# Patient Record
Sex: Female | Born: 1935 | Race: White | Hispanic: No | Smoking: Never smoker
Health system: Southern US, Community
[De-identification: ages and names within clinical notes are randomized; demographics above are authoritative.]

## PROBLEM LIST (undated history)

## (undated) DIAGNOSIS — M1990 Unspecified osteoarthritis, unspecified site: Secondary | ICD-10-CM

## (undated) DIAGNOSIS — K5720 Diverticulitis of large intestine with perforation and abscess without bleeding: Principal | ICD-10-CM

## (undated) DIAGNOSIS — I219 Acute myocardial infarction, unspecified: Secondary | ICD-10-CM

## (undated) DIAGNOSIS — I4891 Unspecified atrial fibrillation: Secondary | ICD-10-CM

## (undated) DIAGNOSIS — D649 Anemia, unspecified: Secondary | ICD-10-CM

## (undated) DIAGNOSIS — Z9289 Personal history of other medical treatment: Secondary | ICD-10-CM

## (undated) DIAGNOSIS — N189 Chronic kidney disease, unspecified: Secondary | ICD-10-CM

## (undated) DIAGNOSIS — E039 Hypothyroidism, unspecified: Secondary | ICD-10-CM

## (undated) DIAGNOSIS — K279 Peptic ulcer, site unspecified, unspecified as acute or chronic, without hemorrhage or perforation: Secondary | ICD-10-CM

## (undated) DIAGNOSIS — W19XXXA Unspecified fall, initial encounter: Secondary | ICD-10-CM

## (undated) DIAGNOSIS — I255 Ischemic cardiomyopathy: Secondary | ICD-10-CM

## (undated) DIAGNOSIS — J301 Allergic rhinitis due to pollen: Secondary | ICD-10-CM

## (undated) DIAGNOSIS — I10 Essential (primary) hypertension: Secondary | ICD-10-CM

## (undated) DIAGNOSIS — K219 Gastro-esophageal reflux disease without esophagitis: Secondary | ICD-10-CM

## (undated) DIAGNOSIS — L97519 Non-pressure chronic ulcer of other part of right foot with unspecified severity: Secondary | ICD-10-CM

## (undated) DIAGNOSIS — I6529 Occlusion and stenosis of unspecified carotid artery: Secondary | ICD-10-CM

## (undated) DIAGNOSIS — T8859XA Other complications of anesthesia, initial encounter: Secondary | ICD-10-CM

## (undated) DIAGNOSIS — T4145XA Adverse effect of unspecified anesthetic, initial encounter: Secondary | ICD-10-CM

## (undated) DIAGNOSIS — R079 Chest pain, unspecified: Secondary | ICD-10-CM

## (undated) DIAGNOSIS — Z8701 Personal history of pneumonia (recurrent): Secondary | ICD-10-CM

## (undated) DIAGNOSIS — E785 Hyperlipidemia, unspecified: Secondary | ICD-10-CM

## (undated) DIAGNOSIS — Y92009 Unspecified place in unspecified non-institutional (private) residence as the place of occurrence of the external cause: Secondary | ICD-10-CM

## (undated) DIAGNOSIS — I739 Peripheral vascular disease, unspecified: Secondary | ICD-10-CM

## (undated) DIAGNOSIS — I2510 Atherosclerotic heart disease of native coronary artery without angina pectoris: Secondary | ICD-10-CM

## (undated) HISTORY — DX: Unspecified osteoarthritis, unspecified site: M19.90

## (undated) HISTORY — DX: Unspecified atrial fibrillation: I48.91

## (undated) HISTORY — DX: Anemia, unspecified: D64.9

## (undated) HISTORY — DX: Peripheral vascular disease, unspecified: I73.9

## (undated) HISTORY — DX: Hypothyroidism, unspecified: E03.9

## (undated) HISTORY — PX: CATARACT EXTRACTION W/ INTRAOCULAR LENS  IMPLANT, BILATERAL: SHX1307

## (undated) HISTORY — DX: Acute myocardial infarction, unspecified: I21.9

## (undated) HISTORY — DX: Essential (primary) hypertension: I10

## (undated) HISTORY — DX: Unspecified fall, initial encounter: W19.XXXA

## (undated) HISTORY — DX: Atherosclerotic heart disease of native coronary artery without angina pectoris: I25.10

## (undated) HISTORY — DX: Diverticulitis of large intestine with perforation and abscess without bleeding: K57.20

## (undated) HISTORY — DX: Occlusion and stenosis of unspecified carotid artery: I65.29

## (undated) HISTORY — DX: Unspecified place in unspecified non-institutional (private) residence as the place of occurrence of the external cause: Y92.009

## (undated) HISTORY — DX: Allergic rhinitis due to pollen: J30.1

## (undated) HISTORY — DX: Hyperlipidemia, unspecified: E78.5

## (undated) HISTORY — PX: CARDIAC CATHETERIZATION: SHX172

---
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
## 1974-01-26 HISTORY — PX: ABDOMINAL ADHESION SURGERY: SHX90

## 1974-01-26 HISTORY — PX: ABDOMINAL HYSTERECTOMY: SHX81

## 1974-01-26 HISTORY — PX: COMBINED HYSTERECTOMY ABDOMINAL W/ A&P REPAIR / OOPHORECTOMY: SUR292

## 2004-01-27 HISTORY — PX: JOINT REPLACEMENT: SHX530

## 2004-07-26 HISTORY — PX: TOTAL HIP ARTHROPLASTY: SHX124

## 2009-10-26 HISTORY — PX: COLECTOMY: SHX59

## 2010-10-27 HISTORY — PX: COLOSTOMY: SHX63

## 2010-11-08 ENCOUNTER — Encounter: Payer: Self-pay | Admitting: Physician Assistant

## 2010-11-09 ENCOUNTER — Encounter: Payer: Self-pay | Admitting: Physician Assistant

## 2010-11-10 ENCOUNTER — Encounter: Payer: Self-pay | Admitting: Physician Assistant

## 2010-11-11 ENCOUNTER — Encounter: Payer: Self-pay | Admitting: Physician Assistant

## 2010-11-12 ENCOUNTER — Encounter: Payer: Self-pay | Admitting: Physician Assistant

## 2010-11-14 ENCOUNTER — Encounter: Payer: Self-pay | Admitting: Physician Assistant

## 2010-11-16 ENCOUNTER — Encounter: Payer: Self-pay | Admitting: Physician Assistant

## 2011-01-21 ENCOUNTER — Other Ambulatory Visit: Payer: Self-pay | Admitting: Internal Medicine

## 2011-01-21 DIAGNOSIS — Z780 Asymptomatic menopausal state: Secondary | ICD-10-CM

## 2011-01-21 DIAGNOSIS — Z1231 Encounter for screening mammogram for malignant neoplasm of breast: Secondary | ICD-10-CM

## 2011-01-23 ENCOUNTER — Institutional Professional Consult (permissible substitution): Payer: Self-pay | Admitting: Cardiology

## 2011-02-09 ENCOUNTER — Ambulatory Visit
Admission: RE | Admit: 2011-02-09 | Discharge: 2011-02-09 | Disposition: A | Discharge status: Home / Self Care | Payer: Medicare Other | Source: Ambulatory Visit | Attending: Internal Medicine | Admitting: Internal Medicine

## 2011-02-09 ENCOUNTER — Ambulatory Visit (INDEPENDENT_AMBULATORY_CARE_PROVIDER_SITE_OTHER): Payer: Medicare Other | Admitting: Surgery

## 2011-02-09 DIAGNOSIS — Z1231 Encounter for screening mammogram for malignant neoplasm of breast: Secondary | ICD-10-CM

## 2011-02-09 DIAGNOSIS — Z780 Asymptomatic menopausal state: Secondary | ICD-10-CM

## 2011-02-11 ENCOUNTER — Ambulatory Visit (INDEPENDENT_AMBULATORY_CARE_PROVIDER_SITE_OTHER): Payer: Medicare Other | Admitting: Surgery

## 2011-02-19 ENCOUNTER — Ambulatory Visit (INDEPENDENT_AMBULATORY_CARE_PROVIDER_SITE_OTHER): Payer: Medicare Other | Admitting: Cardiology

## 2011-02-19 ENCOUNTER — Encounter: Payer: Self-pay | Admitting: Cardiology

## 2011-02-19 VITALS — BP 140/110 | HR 56 | Ht 62.0 in | Wt 129.0 lb

## 2011-02-19 DIAGNOSIS — I219 Acute myocardial infarction, unspecified: Secondary | ICD-10-CM

## 2011-02-19 DIAGNOSIS — R0989 Other specified symptoms and signs involving the circulatory and respiratory systems: Secondary | ICD-10-CM | POA: Insufficient documentation

## 2011-02-19 DIAGNOSIS — I10 Essential (primary) hypertension: Secondary | ICD-10-CM | POA: Insufficient documentation

## 2011-02-19 DIAGNOSIS — I4891 Unspecified atrial fibrillation: Secondary | ICD-10-CM | POA: Insufficient documentation

## 2011-02-19 DIAGNOSIS — R011 Cardiac murmur, unspecified: Secondary | ICD-10-CM | POA: Insufficient documentation

## 2011-02-19 DIAGNOSIS — I2510 Atherosclerotic heart disease of native coronary artery without angina pectoris: Secondary | ICD-10-CM

## 2011-02-19 NOTE — Patient Instructions (Signed)
Your physician has requested that you have an echocardiogram. Echocardiography is a painless test that uses sound waves to create images of your heart. It provides your doctor with information about the size and shape of your heart and how well your heart's chambers and valves are working. This procedure takes approximately one hour. There are no restrictions for this procedure.  Continue current medications as listed.  Follow up in approximately in 2 weeks with Dr Antoine Poche

## 2011-02-19 NOTE — Assessment & Plan Note (Signed)
Her blood pressure is elevated. I would like her to keep a blood pressure diary. It is likely that she will need med titration. However, she reports sensitivities to multiple medications.

## 2011-02-19 NOTE — Assessment & Plan Note (Signed)
When she comes back I will schedule carotid Dopplers.

## 2011-02-19 NOTE — Assessment & Plan Note (Signed)
I am confused about her past history of coronary disease. I would like to get the records from [place]. At this point I will start with an echocardiogram to evaluate ejection fraction and the murmur described. It is very likely that she will need stress perfusion imaging prior to any colostomy reversal and I discussed this with her and her daughter.

## 2011-02-19 NOTE — Assessment & Plan Note (Signed)
She could likely discontinue the amiodarone. However, if she is to have surgery very so and it would be reasonable to continue this through surgery to try to prevent any atrial fibrillation induced by this.   

## 2011-02-19 NOTE — Progress Notes (Signed)
HPI The patient presents as a new patient. She recently moved from [place]. From outside records I see that she was admitted in [facility] in [facility] last October with a perforated diverticulum with peritoneal abscess.    She also had atrial fibrillation.  There was apparently no work up of her MI at the time of this hospitalization.  She is currently living with 1 of her sons. She may move back to [place]. She might actually have reversal of a colostomy before she moves back however. She is due to meet with a surgeon soon.  Aside from the recent history that sounds like a non-Q-wave MI during an episode of sepsis and atrial fibrillation she has an otherwise vague cardiac history. She reports a heart attack in the 1950s. She reports being told in 1989 that she needed triple vessel bypass. However, she then does on to say that she didn't have catheterization at that time. She was treated with years of chelation.  She denies any active chest pressure, neck or arm discomfort. She does not have any palpitations, presyncope or syncope. She has no PND or orthopnea. She has been weak since her extended hospitalization with prolonged rehabilitation.  Allergies  Allergen Reactions  . Demerol Other (See Comments)    coma  . Cephalosporins   . Contrast Media (Iodinated Diagnostic Agents)   . Insulins   . Keflex   . Latex   . Morphine And Related   . Penicillins   . Sulfa Antibiotics   . Aspirin     coma    Current Outpatient Prescriptions  Medication Sig Dispense Refill  . amiodarone (PACERONE) 200 MG tablet Take 200 mg by mouth daily.      . cetirizine (ZYRTEC) 10 MG tablet Take 10 mg by mouth daily.      Marland Kitchen dextromethorphan (DELSYM) 30 MG/5ML liquid Take 60 mg by mouth as needed.      . metoprolol tartrate (LOPRESSOR) 25 MG tablet Take 25 mg by mouth 2 (two) times daily. 1/ 2 tab in am 1/2 in pm      . NON FORMULARY Take 25 mg by mouth. apap codeine 30 mg 1-2 q 4 hrs as needed         Past Medical History  Diagnosis Date  . Coronary artery disease   . Arrhythmia     afib  . Diverticulitis of colon (without mention of hemorrhage)   . Injury to liver   . Allergic rhinitis due to pollen   . Hyperlipidemia   . Hypertension   . Bone/cartilage disorder   . Vitamin D deficiency   . Anemia, unspecified     Past Surgical History  Procedure Date  . Appendectomy   . Vaginal hysterectomy   . Total hip arthroplasty     Right  . Colostomy     Family History  Problem Relation Age of Onset  . Heart disease Mother 9    Pacemaker, No CAD  . Lung disease Father 20    Smoking    History   Social History  . Marital Status: Widowed    Spouse Name: N/A    Number of Children: N/A  . Years of Education: N/A   Occupational History  . Not on file.   Social History Main Topics  . Smoking status: Never Smoker   . Smokeless tobacco: Not on file  . Alcohol Use: Not on file  . Drug Use: Not on file  . Sexually Active:  Not on file   Other Topics Concern  . Not on file   Social History Narrative  . No narrative on file    ROS:  Decreased appetite, nausea. Otherwise as stated in the HPI and negative for all other systems.  PHYSICAL EXAM BP 140/110  Pulse 56  Ht 5\' 2"  (1.575 m)  Wt 129 lb (58.514 kg)  BMI 23.59 kg/m2 GENERAL:  Well appearing HEENT:  Pupils equal round and reactive, fundi not visualized, oral mucosa unremarkable NECK:  No jugular venous distention, waveform within normal limits, carotid upstroke brisk and symmetric, bilateral bruits, no thyromegaly LYMPHATICS:  No cervical, inguinal adenopathy LUNGS:  Clear to auscultation bilaterally BACK:  No CVA tenderness CHEST:  Unremarkable HEART:  PMI not displaced or sustained,S1 and S2 within normal limits, no S3, no S4, no clicks, no rubs, apical holosystolic murmur 3/6 with radiation to the axilla and mildly out the outflow tract. ABD:  Flat, positive bowel sounds normal in frequency in  pitch, no bruits, no rebound, no guarding, no midline pulsatile mass, no hepatomegaly, no splenomegaly, colostomy left lower quadrent EXT:  2 plus pulses throughout, no edema, no cyanosis no clubbing SKIN:  No rashes no nodules NEURO:  Cranial nerves II through XII grossly intact, motor grossly intact throughout PSYCH:  Cognitively intact, oriented to person place and time  EKG:  Sinus rhythm, rate 56, axis within normal limits, early transition in lead V2, no acute ST-T wave changes. 02/19/2011   ASSESSMENT AND PLAN

## 2011-02-19 NOTE — Assessment & Plan Note (Signed)
This will be evaluated with the echo as above. She likely has some mitral regurgitation.

## 2011-02-20 ENCOUNTER — Telehealth: Payer: Self-pay | Admitting: Cardiology

## 2011-02-20 NOTE — Telephone Encounter (Addendum)
ROI faxed to [facility] @ [phone] For Angelina Sheriff   02/20/11/KM  Records Received from [facility] gave to [facility] 02/20/11/KM

## 2011-02-23 ENCOUNTER — Ambulatory Visit (INDEPENDENT_AMBULATORY_CARE_PROVIDER_SITE_OTHER): Payer: Medicare Other | Admitting: Surgery

## 2011-02-23 ENCOUNTER — Encounter (INDEPENDENT_AMBULATORY_CARE_PROVIDER_SITE_OTHER): Payer: Self-pay | Admitting: Surgery

## 2011-02-23 DIAGNOSIS — K5720 Diverticulitis of large intestine with perforation and abscess without bleeding: Secondary | ICD-10-CM

## 2011-02-23 DIAGNOSIS — K5732 Diverticulitis of large intestine without perforation or abscess without bleeding: Secondary | ICD-10-CM

## 2011-02-23 HISTORY — DX: Diverticulitis of large intestine with perforation and abscess without bleeding: K57.20

## 2011-02-23 MED ORDER — METRONIDAZOLE IVPB CUSTOM: 500.0000 mg | INTRAVENOUS | Status: AC

## 2011-02-23 NOTE — Progress Notes (Signed)
Subjective:     Patient ID: Julia Greene, female   DOB: 11/07/1930, 78 y.o.   MRN: [id number]  HPI  Julia Greene  1931/01/22 [id number]  Patient Care Team: Oneal Grout, MD as PCP - General (Internal Medicine) Rollene Rotunda, MD as Consulting Physician (Cardiology) Rob Bunting, MD as Consulting Physician (Gastroenterology)  This patient is a 78 y.o.female who presents today for surgical evaluation.   Diagnosis: Perforated diverticulitis with acute abdomen  Procedure laparotomy and sigmoid colectomy 01/08/2011 by Dr. Leeroy Cha at  [facility], [place], [place]  Reason for visit: Consideration of colostomy takedown  Patient is an elderly woman that required emergency surgery. She had to be in the hospital for some time. She had atrial fibrillation and question of myocardial  infarction. She was in the intensive care unit. Gradually, she recovered. She was at a skilled facility for the next month. Due to financial issues, [place] her children. They come in today with her.  She no longer needs a cane. She's getting up and down well. She is establishing with  cardiology. She wishes to consider having the colostomy taken down. No bleeding.  She's never had a colonoscopy. No history of rectal bleeding no history of other colon surgeries  Patient Active Problem List  Diagnoses  . Murmur  . Bruit  . Atrial fibrillation  . Myocardial infarction  . HTN (hypertension)  . Diverticulitis of colon with perforation s/p colectomy/ostomy 13Oct2012    Past Medical History  Diagnosis Date  . Coronary artery disease   . Arrhythmia     afib  . Diverticulitis of colon (without mention of hemorrhage)   . Injury to liver   . Allergic rhinitis due to pollen   . Hyperlipidemia   . Hypertension   . Bone/cartilage disorder   . Vitamin D deficiency   . Anemia, unspecified   . Arthritis   . Diabetes mellitus     boarderline  .  A-fib   . Diverticulitis of colon with perforation s/p colectomy/ostomy 13Oct2012 02/23/2011    Past Surgical History  Procedure Date  . Appendectomy   . Vaginal hysterectomy   . Total hip arthroplasty 07/2004    Right  . Colostomy 10/2010  . Cesarean section 1955, 1959, 1960  . Abdominal hysterectomy 1976  . Bladder repair 1976    History   Social History  . Marital Status: Widowed    Spouse Name: N/A    Number of Children: N/A  . Years of Education: N/A   Occupational History  . Not on file.   Social History Main Topics  . Smoking status: Never Smoker   . Smokeless tobacco: Never Used  . Alcohol Use: No  . Drug Use: No  . Sexually Active: Not on file   Other Topics Concern  . Not on file   Social History Narrative  . No narrative on file    Family History  Problem Relation Age of Onset  . Heart disease Mother 87    Pacemaker, No CAD  . Lung disease Father 94    Smoking  . Cancer Daughter     ovarian    Current outpatient prescriptions:Melatonin 3 MG CAPS, Take by mouth as needed., Disp: , Rfl: ;  amiodarone (PACERONE) 200 MG tablet, Take 200 mg by mouth daily., Disp: , Rfl: ;  cetirizine (ZYRTEC) 10 MG tablet, Take 10 mg by mouth daily., Disp: , Rfl: ;  dextromethorphan (  DELSYM) 30 MG/5ML liquid, Take 60 mg by mouth as needed., Disp: , Rfl:  metoprolol tartrate (LOPRESSOR) 25 MG tablet, Take 25 mg by mouth 2 (two) times daily. 1/ 2 tab in am 1/2 in pm, Disp: , Rfl: ;  NON FORMULARY, Take 25 mg by mouth. apap codeine 30 mg 1-2 q 4 hrs as needed, Disp: , Rfl:  Current facility-administered medications:metroNIDAZOLE (FLAGYL) IVPB 500 mg, 500 mg, Intravenous, On Call to OR, Ardeth Sportsman, MD  Allergies  Allergen Reactions  . Demerol Other (See Comments)    coma  . Cephalosporins   . Contrast Media (Iodinated Diagnostic Agents)   . Insulins   . Keflex   . Latex   . Morphine And Related   . Other     Patient highly sensitive to antibiotics, narcotics and  any medication used to put patient to sleep for surgery.  . Penicillins   . Sulfa Antibiotics   . Aspirin     coma    BP 140/62  Pulse 70  Temp(Src) 97.9 F (36.6 C) (Temporal)  Resp 16  Ht 5' 2.5" (1.588 m)  Wt 127 lb 9.6 oz (57.879 kg)  BMI 22.97 kg/m2     Review of Systems  Constitutional: Negative for fever, chills, diaphoresis, appetite change and fatigue.  HENT: Negative for ear pain, sore throat, trouble swallowing, neck pain and ear discharge.   Eyes: Negative for photophobia, discharge and visual disturbance.  Respiratory: Negative for cough, choking, chest tightness and shortness of breath.   Cardiovascular: Negative for chest pain and palpitations.       Walks up/down steps OK.    Gastrointestinal: Negative for nausea, vomiting, abdominal pain, diarrhea, constipation, anal bleeding and rectal pain.       BM 1-2/day in bag  Genitourinary: Negative for dysuria, frequency and difficulty urinating.  Musculoskeletal: Negative for myalgias and gait problem.  Skin: Negative for color change, pallor and rash.  Neurological: Negative for dizziness, tremors, speech difficulty, weakness, light-headedness, numbness and headaches.  Hematological: Negative for adenopathy.  Psychiatric/Behavioral: Negative for confusion and agitation. The patient is not nervous/anxious.        Objective:   Physical Exam  Constitutional: She is oriented to person, place, and time. She appears well-developed and well-nourished. No distress.  HENT:  Head: Normocephalic.  Mouth/Throat: Oropharynx is clear and moist. No oropharyngeal exudate.  Eyes: Conjunctivae and EOM are normal. Pupils are equal, round, and reactive to light. No scleral icterus.  Neck: Normal range of motion. Neck supple. No tracheal deviation present.  Cardiovascular: Normal rate, regular rhythm and intact distal pulses.   Pulmonary/Chest: Effort normal and breath sounds normal. No respiratory distress. She exhibits no  tenderness.  Abdominal: Soft. She exhibits no distension and no mass. There is no tenderness. Hernia confirmed negative in the right inguinal area and confirmed negative in the left inguinal area.    Genitourinary: No vaginal discharge found.  Musculoskeletal: Normal range of motion. She exhibits no tenderness.  Lymphadenopathy:    She has no cervical adenopathy.       Right: No inguinal adenopathy present.       Left: No inguinal adenopathy present.  Neurological: She is alert and oriented to person, place, and time. No cranial nerve deficit. She exhibits normal muscle tone. Coordination normal.  Skin: Skin is warm and dry. No rash noted. She is not diaphoretic. No erythema.  Psychiatric: She has a normal mood and affect. Her behavior is normal. Judgment and thought content normal.  Her mood appears not anxious. Her speech is tangential. She does not exhibit a depressed mood.       Pleasant, smiling.  Talkative, reminiscent but directable       Assessment:     3 months s/p emergent colectomy/ostomy for perforated diverticulitis    Plan:     I think it is reasonable to consider colostomy takedown at some point. It is reasonable to start out laparoscopically. Her family favors that as well.  She needs a colonoscopy preoperatively to rule out any other pathology prior to takedown. She needs cardiology clearance. Dr. Antoine Poche is in the process of doing that. She is more than 3 months out. Once these two things are done, I would be willing to schedule surgery for her.  The anatomy & physiology of the digestive tract was discussed.  The pathophysiology was discussed.  Possibility of remaining with an ostomy permanently was discussed.  I offered ostomy takedown.  Laparoscopic & open techniques were discussed.   Risks such as bleeding, infection, abscess, leak, reoperation, possible re-ostomy, injury to other organs, hernia, heart attack, death, and other risks were discussed.   I noted a good  likelihood this will help address the problem.  Goals of post-operative recovery were discussed as well.  We will work to minimize complications.  Questions were answered.  The patient expresses understanding & wishes to proceed with surgery.

## 2011-02-24 ENCOUNTER — Telehealth: Payer: Self-pay

## 2011-02-24 NOTE — Telephone Encounter (Signed)
Brett Canales, Happy to take care of this. Thanks.   Sirr Kabel, Can you set her up with ngi appt with me in office, next week sometime. Double book if needed. Thanks  dj  ----- Message ----- From: Ardeth Sportsman, MD Sent: 02/23/2011 6:24 PM To: Rob Bunting, MD  Pt needs preop colonoscopy- can you do? Had sigmoid colectomy w colostomy in [place] before moving up here. Never has had a colonoscopy.

## 2011-02-24 NOTE — Telephone Encounter (Signed)
Pt has been notified.

## 2011-02-24 NOTE — Telephone Encounter (Signed)
03/03/11 245 pm appointment to arrive 15 minutes early for paperwork. Left message with family member to return call.

## 2011-02-26 ENCOUNTER — Ambulatory Visit (HOSPITAL_COMMUNITY): Payer: Medicare Other | Attending: Cardiovascular Disease | Admitting: Radiology

## 2011-02-26 DIAGNOSIS — I252 Old myocardial infarction: Secondary | ICD-10-CM | POA: Insufficient documentation

## 2011-02-26 DIAGNOSIS — I10 Essential (primary) hypertension: Secondary | ICD-10-CM | POA: Insufficient documentation

## 2011-02-26 DIAGNOSIS — I4891 Unspecified atrial fibrillation: Secondary | ICD-10-CM | POA: Insufficient documentation

## 2011-02-26 DIAGNOSIS — I2510 Atherosclerotic heart disease of native coronary artery without angina pectoris: Secondary | ICD-10-CM | POA: Insufficient documentation

## 2011-03-03 ENCOUNTER — Encounter: Payer: Self-pay | Admitting: Gastroenterology

## 2011-03-03 ENCOUNTER — Ambulatory Visit (INDEPENDENT_AMBULATORY_CARE_PROVIDER_SITE_OTHER): Payer: Medicare Other | Admitting: Gastroenterology

## 2011-03-03 VITALS — BP 144/50 | HR 56 | Ht 62.0 in | Wt 128.2 lb

## 2011-03-03 DIAGNOSIS — K5730 Diverticulosis of large intestine without perforation or abscess without bleeding: Secondary | ICD-10-CM

## 2011-03-03 DIAGNOSIS — Z1211 Encounter for screening for malignant neoplasm of colon: Secondary | ICD-10-CM

## 2011-03-03 MED ORDER — PEG-KCL-NACL-NASULF-NA ASC-C 100 G PO SOLR: 1.0000 | ORAL | Status: DC

## 2011-03-03 NOTE — Patient Instructions (Signed)
You will be set up for a colonoscopy via rectum and ostomy. A copy of this information will be made available to Dr. Michaell Cowing

## 2011-03-03 NOTE — Progress Notes (Signed)
HPI: This is a   very pleasant 78 year old woman who is here with her daughter-in-law today.  She underwent  laparotomy and sigmoid colectomy, colostomy 11/08/2010 by Dr. Leeroy Cha at [facility], [place], [place];  this was done for perforated sigmoid diverticulitis. She has seen Dr. Estelle Grumbles at [facility] surgery recently and he is planning for colostomy takedown pending the results of the colonoscopy and cardiac evaluation.  She was in hospital for about 2 weeks, she was in ICU for several days, had AMI due to afib.  She coded, underwent CPR and intubation.  Her husband died while she was in hospital.  Spent a brief time at [facility] but has been living with family for about 2 months now.   Colostomy working well.  She has minor periodic mucous output.  No bleeding.  Never had colonoscopy.     Review of systems: Pertinent positive and negative review of systems were noted in the above HPI section. Complete review of systems was performed and was otherwise normal.    Past Medical History  Diagnosis Date  . Coronary artery disease   . Arrhythmia     afib  . Diverticulitis of colon (without mention of hemorrhage)   . Injury to liver   . Allergic rhinitis due to pollen   . Hyperlipidemia   . Hypertension   . Bone/cartilage disorder   . Vitamin d deficiency   . Anemia, unspecified   . Arthritis   . Diabetes mellitus     boarderline  . A-fib   . Diverticulitis of colon with perforation s/p colectomy/ostomy 13Oct2012 02/23/2011    Past Surgical History  Procedure Date  . Appendectomy   . Total hip arthroplasty 07/2004    Right  . Colostomy 10/2010  . Cesarean section 1955, 1959, 1960  . Abdominal hysterectomy 1976  . Bladder repair 1976  . Abdominal adhesion surgery     at time of hysterectomy    Current Outpatient Prescriptions  Medication Sig Dispense Refill  . acetaminophen-codeine (TYLENOL #3) 300-30 MG per tablet Take 1-2 tablets  by mouth every 4 hours as needed      . amiodarone (PACERONE) 200 MG tablet Take 200 mg by mouth daily.      . cetirizine (ZYRTEC) 10 MG tablet Take 10 mg by mouth daily.      Marland Kitchen dextromethorphan (DELSYM) 30 MG/5ML liquid Take 60 mg by mouth as needed.      . Melatonin 3 MG CAPS Take by mouth as needed.      . metoprolol tartrate (LOPRESSOR) 25 MG tablet Take 1/2 tablet by mouth twice daily      . naproxen sodium (ALEVE) 220 MG tablet Take 2 tablets as needed        Allergies as of 03/03/2011 - Review Complete 03/03/2011  Allergen Reaction Noted  . Demerol Other (See Comments) 02/19/2011  . Cephalosporins  02/18/2011  . Contrast media (iodinated diagnostic agents)  02/18/2011  . Insulins  02/18/2011  . Keflex  02/18/2011  . Latex  02/18/2011  . Morphine and related  02/19/2011  . Other  02/23/2011  . Penicillins  02/18/2011  . Sulfa antibiotics  02/18/2011  . Aspirin  02/18/2011    Family History  Problem Relation Age of Onset  . Heart disease Mother 21    Pacemaker, No CAD  . Lung disease Father 25    Smoking  . Ovarian cancer Daughter   . Colon cancer Neg Hx   .  Diverticulitis Mother   . Heart disease Maternal Grandmother   . Diabetes Mother   . Diabetes Maternal Aunt     x 2    History   Social History  . Marital Status: Widowed    Spouse Name: N/A    Number of Children: N/A  . Years of Education: N/A   Occupational History  . Not on file.   Social History Main Topics  . Smoking status: Never Smoker   . Smokeless tobacco: Never Used  . Alcohol Use: No  . Drug Use: No  . Sexually Active: Not on file   Other Topics Concern  . Not on file   Social History Narrative  . No narrative on file       Physical Exam: BP 144/50  Pulse 56  Ht 5\' 2"  (1.575 m)  Wt 128 lb 3.2 oz (58.151 kg)  BMI 23.45 kg/m2 Constitutional: generally well-appearing Psychiatric: alert and oriented x3 Eyes: extraocular movements intact Mouth: oral pharynx moist, no  lesions Neck: supple no lymphadenopathy Cardiovascular: heart regular rate and rhythm Lungs: clear to auscultation bilaterally Abdomen: soft, nontender, nondistended, no obvious ascites, no peritoneal signs, normal bowel sounds. Left sided colostomy bag with brown liquid stool and gas. Extremities: no lower extremity edema bilaterally Skin: no lesions on visible extremities    Assessment and plan: 78 y.o. female with  recent diverting colostomy, sigmoid resection for perforated diverticulitis  I will proceed with colonoscopy via rectum as well as through her ostomy at her soonest convenience. I see no reason for any further blood tests or imaging studies prior to this.

## 2011-03-05 ENCOUNTER — Encounter (INDEPENDENT_AMBULATORY_CARE_PROVIDER_SITE_OTHER): Payer: Self-pay

## 2011-03-05 ENCOUNTER — Encounter: Payer: Self-pay | Admitting: Cardiology

## 2011-03-05 ENCOUNTER — Ambulatory Visit (INDEPENDENT_AMBULATORY_CARE_PROVIDER_SITE_OTHER): Payer: Medicare Other | Admitting: Cardiology

## 2011-03-05 DIAGNOSIS — I340 Nonrheumatic mitral (valve) insufficiency: Secondary | ICD-10-CM

## 2011-03-05 DIAGNOSIS — Z01810 Encounter for preprocedural cardiovascular examination: Secondary | ICD-10-CM

## 2011-03-05 DIAGNOSIS — I2510 Atherosclerotic heart disease of native coronary artery without angina pectoris: Secondary | ICD-10-CM

## 2011-03-05 DIAGNOSIS — I429 Cardiomyopathy, unspecified: Secondary | ICD-10-CM | POA: Insufficient documentation

## 2011-03-05 DIAGNOSIS — R0989 Other specified symptoms and signs involving the circulatory and respiratory systems: Secondary | ICD-10-CM

## 2011-03-05 DIAGNOSIS — I10 Essential (primary) hypertension: Secondary | ICD-10-CM

## 2011-03-05 DIAGNOSIS — I428 Other cardiomyopathies: Secondary | ICD-10-CM

## 2011-03-05 DIAGNOSIS — I059 Rheumatic mitral valve disease, unspecified: Secondary | ICD-10-CM

## 2011-03-05 MED ORDER — LISINOPRIL 5 MG PO TABS: 5.0000 mg | ORAL_TABLET | Freq: Every day | ORAL | Status: DC

## 2011-03-05 NOTE — Patient Instructions (Signed)
Please start Lisinopril 5 mg a day. Continue all other medications as needed.  Please have blood work in 2 weeks (BMP).  You may eat and drink like normal this day.  Your physician has requested that you have a carotid duplex. This test is an ultrasound of the carotid arteries in your neck. It looks at blood flow through these arteries that supply the brain with blood. Allow one hour for this exam. There are no restrictions or special instructions.  Your physician has requested that you have a lexiscan myoview. For further information please visit [email or web address] Please follow instruction sheet, as given.  Please follow up with Dr Antoine Poche in 1 month.

## 2011-03-05 NOTE — Assessment & Plan Note (Signed)
The patient does have a mildly reduced ejection fraction. I will begin the titrate medications. She's been sensitive to meds. I will move slowly with lisinopril 5 mg daily. She will need a basic metabolic profile in about 2 weeks.

## 2011-03-05 NOTE — Assessment & Plan Note (Signed)
I will follow this clinically and with repeat echoes. In the future I will have a low threshold for transesophageal echocardiography as the MR may be underestimated.

## 2011-03-05 NOTE — Assessment & Plan Note (Signed)
I will schedule carotid Dopplers. 

## 2011-03-05 NOTE — Assessment & Plan Note (Signed)
Her blood pressure will be managed in the context of treating her cardiomyopathy. 

## 2011-03-05 NOTE — Progress Notes (Signed)
HPI The patient returns for followup. Her past cardiac history is outlined in the previous note. She is being considered for take down of colostomy. I sent her for an echocardiogram which demonstrated at least moderate mitral patient with a mildly reduced ejection fraction global hypokinesis. I have not gotten any of the records from [place] about previous coronary disease. Since I last saw her she has had no new cardiovascular complaints. She is a week. She's not having any new shortness of breath, PND or orthopnea. There have been no new palpitations, presyncope or syncope. She's not describing new chest discomfort. Symptoms are as outlined previously.  Allergies  Allergen Reactions  . Demerol Other (See Comments)    coma  . Cephalosporins   . Contrast Media (Iodinated Diagnostic Agents)   . Insulins   . Keflex   . Latex   . Morphine And Related   . Other     Patient highly sensitive to antibiotics, narcotics and any medication used to put patient to sleep for surgery.  . Penicillins   . Sulfa Antibiotics   . Aspirin     coma    Current Outpatient Prescriptions  Medication Sig Dispense Refill  . acetaminophen-codeine (TYLENOL #3) 300-30 MG per tablet Take 1-2 tablets by mouth every 4 hours as needed      . amiodarone (PACERONE) 200 MG tablet Take 200 mg by mouth daily.      . cetirizine (ZYRTEC) 10 MG tablet Take 10 mg by mouth daily.      Marland Kitchen dextromethorphan (DELSYM) 30 MG/5ML liquid Take 60 mg by mouth as needed.      . Melatonin 3 MG CAPS Take by mouth as needed.      . metoprolol tartrate (LOPRESSOR) 25 MG tablet Take 1/2 tablet by mouth twice daily      . naproxen sodium (ALEVE) 220 MG tablet Take 2 tablets as needed      . peg 3350 powder (MOVIPREP) 100 G SOLR Take 1 kit (100 g total) by mouth as directed. See written handout  1 kit  0  . lisinopril (PRINIVIL,ZESTRIL) 5 MG tablet Take 1 tablet (5 mg total) by mouth daily.  30 tablet  11    Past Medical History    Diagnosis Date  . Coronary artery disease   . Arrhythmia     afib  . Diverticulitis of colon (without mention of hemorrhage)   . Injury to liver   . Allergic rhinitis due to pollen   . Hyperlipidemia   . Hypertension   . Bone/cartilage disorder   . Vitamin d deficiency   . Anemia, unspecified   . Arthritis   . Diabetes mellitus     boarderline  . A-fib   . Diverticulitis of colon with perforation s/p colectomy/ostomy 13Oct2012 02/23/2011    Past Surgical History  Procedure Date  . Appendectomy   . Total hip arthroplasty 07/2004    Right  . Colostomy 10/2010  . Cesarean section 1955, 1959, 1960  . Abdominal hysterectomy 1976  . Bladder repair 1976  . Abdominal adhesion surgery     at time of hysterectomy    ROS:  Decreased appetite, nausea. Otherwise as stated in the HPI and negative for all other systems.  PHYSICAL EXAM BP 140/60  Pulse 62  Ht 5\' 2"  (1.575 m)  Wt 126 lb (57.153 kg)  BMI 23.05 kg/m2 GENERAL:  Frail appearing NECK:  No jugular venous distention, waveform within normal limits, carotid upstroke brisk and  symmetric, bilateral bruits, no thyromegaly LYMPHATICS:  No cervical, inguinal adenopathy LUNGS:  Clear to auscultation bilaterally BACK:  No CVA tenderness CHEST:  Unremarkable HEART:  PMI not displaced or sustained,S1 and S2 within normal limits, no S3, no S4, no clicks, no rubs, apical holosystolic murmur 3/6 with radiation to the axilla and mildly out the outflow tract. ABD:  Flat, positive bowel sounds normal in frequency in pitch, no bruits, no rebound, no guarding, no midline pulsatile mass, no hepatomegaly, no splenomegaly, colostomy left lower quadrent EXT:  2 plus pulses throughout, no edema, no cyanosis no clubbing SKIN:  No rashes no nodules NEURO:  Cranial nerves II through XII grossly intact, motor grossly intact throughout PSYCH:  Cognitively intact, oriented to person place and time   ASSESSMENT AND PLAN

## 2011-03-11 ENCOUNTER — Encounter: Payer: Self-pay | Admitting: Gastroenterology

## 2011-03-11 ENCOUNTER — Ambulatory Visit (AMBULATORY_SURGERY_CENTER): Payer: Medicare Other | Admitting: Gastroenterology

## 2011-03-11 VITALS — BP 126/69 | HR 51 | Temp 97.4°F | Resp 14 | Ht 62.0 in | Wt 128.0 lb

## 2011-03-11 DIAGNOSIS — R933 Abnormal findings on diagnostic imaging of other parts of digestive tract: Secondary | ICD-10-CM

## 2011-03-11 DIAGNOSIS — K5730 Diverticulosis of large intestine without perforation or abscess without bleeding: Secondary | ICD-10-CM

## 2011-03-11 DIAGNOSIS — Z1211 Encounter for screening for malignant neoplasm of colon: Secondary | ICD-10-CM

## 2011-03-11 MED ORDER — SODIUM CHLORIDE 0.9 % IV SOLN: 500.0000 mL | INTRAVENOUS | Status: DC

## 2011-03-11 NOTE — Progress Notes (Signed)
Physician made aware of b/p and heart rate fluids infusing continously.

## 2011-03-11 NOTE — Op Note (Signed)
[facility] [address] [place], [place]  [address]  COLONOSCOPY PROCEDURE REPORT  PATIENT:  Julia, Greene  MR#:  [id number] BIRTHDATE:  [birth year], 80 yrs. old  GENDER:  female ENDOSCOPIST:  Rachael Fee, MD REF. BY:  Karie Soda, M.D. PROCEDURE DATE:  03/11/2011 PROCEDURE:  Colonoscopy via colostomy and inspection of Hartman's pouch via anus ASA CLASS:  Class II INDICATIONS:  recent segmental resections and left sided colostomy for complex diverticular disease (not in [place]), likly to undergo colostomy takedown soon MEDICATIONS:  Fentanyl 25 mcg IV, These medications were titrated to patient response per physician's verbal order, Versed 4 mg IV  DESCRIPTION OF PROCEDURE:   After the risks benefits and alternatives of the procedure were thoroughly explained, informed consent was obtained.  Digital rectal exam was performed and revealed no rectal masses.   The LB [id number]-[id number] endoscope was introduced through the anus and advanced to proximal end of the Hartman's pouch. Following that the scope was introduced through the left sided ostomy and advanced to the cecum, which was identified by both the appendix and ileocecal valve, without limitations.  The quality of the prep was good..  The instrument was then slowly withdrawn as the colon was fully examined. <<PROCEDUREIMAGES>>  FINDINGS:  Mild diverticulosis was found in the sigmoid to descending colon segments (see image4).  This was otherwise a normal examination of the colon (see image3, image2, and image1) and Hartman's pouch.  Retroflexed views in the rectum revealed no abnormalities. COMPLICATIONS:  None  ENDOSCOPIC IMPRESSION: 1) Mild diverticulosis in the sigmoid to descending colon segments 2) Otherwise normal examination of the colon and Hartman's pouch (inspected via anus and via ostomy) 3) No polyps and no cancers  RECOMMENDATIONS: Proceed with ostomy takedown, per Dr.  Michaell Cowing.  ______________________________ Rachael Fee, MD  n. eSIGNED:   Rachael Fee at 03/11/2011 03:12 PM  [name], [phone]

## 2011-03-11 NOTE — Patient Instructions (Signed)
Please refer to your blue and neon green sheets for instructions regarding diet and activity for the rest of today.  You may resume your medications as you would normally take them.  

## 2011-03-11 NOTE — Progress Notes (Signed)
Patient did not experience any of the following events: a burn prior to discharge; a fall within the facility; wrong site/side/patient/procedure/implant event; or a hospital transfer or hospital admission upon discharge from the facility. (G8907) Patient did not have preoperative order for IV antibiotic SSI prophylaxis. (G8918)  

## 2011-03-12 ENCOUNTER — Telehealth: Payer: Self-pay

## 2011-03-12 ENCOUNTER — Ambulatory Visit (HOSPITAL_COMMUNITY): Payer: Medicare Other | Attending: Cardiology | Admitting: Radiology

## 2011-03-12 DIAGNOSIS — R0989 Other specified symptoms and signs involving the circulatory and respiratory systems: Secondary | ICD-10-CM | POA: Insufficient documentation

## 2011-03-12 DIAGNOSIS — Z933 Colostomy status: Secondary | ICD-10-CM | POA: Insufficient documentation

## 2011-03-12 DIAGNOSIS — I2510 Atherosclerotic heart disease of native coronary artery without angina pectoris: Secondary | ICD-10-CM

## 2011-03-12 DIAGNOSIS — R0609 Other forms of dyspnea: Secondary | ICD-10-CM | POA: Insufficient documentation

## 2011-03-12 DIAGNOSIS — R0602 Shortness of breath: Secondary | ICD-10-CM | POA: Insufficient documentation

## 2011-03-12 DIAGNOSIS — E785 Hyperlipidemia, unspecified: Secondary | ICD-10-CM | POA: Insufficient documentation

## 2011-03-12 DIAGNOSIS — Z01810 Encounter for preprocedural cardiovascular examination: Secondary | ICD-10-CM | POA: Insufficient documentation

## 2011-03-12 DIAGNOSIS — I252 Old myocardial infarction: Secondary | ICD-10-CM | POA: Insufficient documentation

## 2011-03-12 DIAGNOSIS — R002 Palpitations: Secondary | ICD-10-CM | POA: Insufficient documentation

## 2011-03-12 DIAGNOSIS — I10 Essential (primary) hypertension: Secondary | ICD-10-CM | POA: Insufficient documentation

## 2011-03-12 MED ORDER — TECHNETIUM TC 99M TETROFOSMIN IV KIT
33.0000 | PACK | Freq: Once | INTRAVENOUS | Status: AC | PRN
  Administered 2011-03-12: 33 via INTRAVENOUS

## 2011-03-12 MED ORDER — REGADENOSON 0.4 MG/5ML IV SOLN
0.4000 mg | Freq: Once | INTRAVENOUS | Status: AC
  Administered 2011-03-12: 0.4 mg via INTRAVENOUS

## 2011-03-12 MED ORDER — TECHNETIUM TC 99M TETROFOSMIN IV KIT
11.0000 | PACK | Freq: Once | INTRAVENOUS | Status: AC | PRN
  Administered 2011-03-12: 11 via INTRAVENOUS

## 2011-03-12 NOTE — Telephone Encounter (Signed)
Unable to leave message on number [phone] due to no voice mail.

## 2011-03-12 NOTE — Progress Notes (Addendum)
[facility] SITE 3 NUCLEAR MED [address] [place] [address] [phone]  Cardiology Nuclear Med Julia Greene  Julia Greene is a 78 y.o. female [id number] 07-13-1930   Nuclear Med Background Indication for Stress Test:  Evaluation for Ischemia, and Surgical Clearance for  Pending possible upcoming Colostomy Takedown History: 10/12 Myocardial Infarction, 02/26/11 ECHO: EF: 45-50% MDD,MR, CHF, AFIB, Cardiac Risk Factors: Hypertension and Lipids  Symptoms:  DOE, Palpitations and SOB   Nuclear Pre-Procedure Caffeine/Decaff Intake:  None NPO After: 6:30pm yesterday   Lungs:  clear IV 0.9% NS with Angio Cath:  20g  IV Site: R Antecubital x 1, tolerated well IV Started by:  Irean Hong, RN  Chest Size (in):  36 Cup Size: B  Height: 5\' 2"  (1.575 m)  Weight:  122 lb (55.339 kg)  BMI:  Body mass index is 22.31 kg/(m^2). Tech Comments:  Took lopressor this am    Nuclear Med Study 1 or 2 day study: 1 day  Stress Test Type:  Lexiscan  Reading MD: Willa Rough, MD  Order Authorizing Provider:  Rollene Rotunda, MD  Resting Radionuclide: Technetium 75m Tetrofosmin  Resting Radionuclide Dose: 11.0 mCi   Stress Radionuclide:  Technetium 84m Tetrofosmin  Stress Radionuclide Dose: 33.0 mCi           Stress Protocol Rest HR: 51 Stress HR: 63  Rest BP: 118/47 Stress BP: 132/51  Exercise Time (min): n/a METS: n/a   Predicted Max HR: 142 bpm % Max HR: 92.96 bpm Rate Pressure Product: 21308   Dose of Adenosine (mg):  n/a Dose of Lexiscan: 0.4 mg  Dose of Atropine (mg): n/a Dose of Dobutamine: n/a mcg/kg/min (at max HR)  Stress Test Technologist: Milana Na, EMT-P  Nuclear Technologist:  Doyne Keel, CNMT     Rest Procedure:  Myocardial perfusion imaging was performed at rest 45 minutes following the intravenous administration of Technetium 36m Tetrofosmin. Rest ECG: Sinus Bradycardia  Stress Procedure:  The patient received IV Lexiscan 0.4 mg over 15-seconds.   Technetium 19m Tetrofosmin injected at 30-seconds.  There were no significant changes and heaviness in chest with Lexiscan.  Quantitative spect images were obtained after a 45 minute delay. Stress ECG: No significant change from baseline ECG  QPS Raw Data Images:  Patient motion noted; appropriate software correction applied. Stress Images:  There is moderate decreased activity in the entire inferior septum and anterior septum. There is slight decreased activity in the anterior wall. Rest Images:  Normal homogeneous uptake in all areas of the myocardium. Subtraction (SDS):  There is definite moderate ischemia affecting the inferior septum and anterior septum. Transient Ischemic Dilatation (Normal <1.22):  1.18 Lung/Heart Ratio (Normal <0.45):  0.38  Quantitative Gated Spect Images QGS EDV:  101 ml QGS ESV:  43 ml QGS cine images:  Normal Wall Motion QGS EF: 57%  Impression Exercise Capacity:  Lexiscan with no exercise. BP Response:  Normal blood pressure response. Clinical Symptoms:  No significant symptoms ECG Impression:  No significant EKG change Comparison with Prior Nuclear Study: No images to compare  Overall Impression:  The study is concerning for moderate ischemia affecting the entire septum and possibly a small part of the anterior wall. Wall motion is normal.   I reviewed this with the patient's daughter in law.  The patient needs a cath before elective surgery.  She will be out of town but has an appt with me on 3/1.  She has no active symptoms.  I will discuss  the cath at the next appt.  Fayrene Fearing Hochrein 5:55 PM 03/13/2011

## 2011-03-16 ENCOUNTER — Encounter (INDEPENDENT_AMBULATORY_CARE_PROVIDER_SITE_OTHER): Payer: Medicare Other | Admitting: Cardiology

## 2011-03-16 ENCOUNTER — Other Ambulatory Visit (INDEPENDENT_AMBULATORY_CARE_PROVIDER_SITE_OTHER): Payer: Medicare Other | Admitting: *Deleted

## 2011-03-16 DIAGNOSIS — I6529 Occlusion and stenosis of unspecified carotid artery: Secondary | ICD-10-CM

## 2011-03-16 DIAGNOSIS — R0989 Other specified symptoms and signs involving the circulatory and respiratory systems: Secondary | ICD-10-CM

## 2011-03-16 DIAGNOSIS — I428 Other cardiomyopathies: Secondary | ICD-10-CM

## 2011-03-16 DIAGNOSIS — Z01810 Encounter for preprocedural cardiovascular examination: Secondary | ICD-10-CM

## 2011-03-16 LAB — BASIC METABOLIC PANEL
BUN: 30 mg/dL — ABNORMAL HIGH (ref 6–23)
CO2: 27 mEq/L (ref 19–32)
GFR: 80.46 mL/min (ref >60.00)
Glucose, Bld: 120 mg/dL — ABNORMAL HIGH (ref 70–99)
Potassium: 4.4 mEq/L (ref 3.5–5.1)
Sodium: 135 mEq/L (ref 135–145)

## 2011-03-27 ENCOUNTER — Ambulatory Visit (INDEPENDENT_AMBULATORY_CARE_PROVIDER_SITE_OTHER): Payer: Medicare Other | Admitting: Cardiology

## 2011-03-27 ENCOUNTER — Encounter: Payer: Self-pay | Admitting: *Deleted

## 2011-03-27 ENCOUNTER — Encounter: Payer: Self-pay | Admitting: Cardiology

## 2011-03-27 DIAGNOSIS — I4891 Unspecified atrial fibrillation: Secondary | ICD-10-CM

## 2011-03-27 DIAGNOSIS — R0989 Other specified symptoms and signs involving the circulatory and respiratory systems: Secondary | ICD-10-CM

## 2011-03-27 DIAGNOSIS — I340 Nonrheumatic mitral (valve) insufficiency: Secondary | ICD-10-CM

## 2011-03-27 DIAGNOSIS — Z01810 Encounter for preprocedural cardiovascular examination: Secondary | ICD-10-CM

## 2011-03-27 DIAGNOSIS — I219 Acute myocardial infarction, unspecified: Secondary | ICD-10-CM

## 2011-03-27 DIAGNOSIS — I059 Rheumatic mitral valve disease, unspecified: Secondary | ICD-10-CM

## 2011-03-27 DIAGNOSIS — I6529 Occlusion and stenosis of unspecified carotid artery: Secondary | ICD-10-CM

## 2011-03-27 DIAGNOSIS — I10 Essential (primary) hypertension: Secondary | ICD-10-CM

## 2011-03-27 LAB — CBC WITH DIFFERENTIAL/PLATELET
Basophils Absolute: 0 10*3/uL (ref 0.0–0.1)
Basophils Relative: 0 % (ref 0–1)
Eosinophils Relative: 1 % (ref 0–5)
HCT: 36 % (ref 36.0–46.0)
MCH: 28.8 pg (ref 26.0–34.0)
MCHC: 31.7 g/dL (ref 30.0–36.0)
MCV: 90.9 fL (ref 78.0–100.0)
Monocytes Absolute: 1.1 10*3/uL — ABNORMAL HIGH (ref 0.1–1.0)
RDW: 13.5 % (ref 11.5–15.5)

## 2011-03-27 LAB — BASIC METABOLIC PANEL
BUN: 50 mg/dL — ABNORMAL HIGH (ref 6–23)
Chloride: 100 mEq/L (ref 96–112)
Creat: 1.17 mg/dL — ABNORMAL HIGH (ref 0.50–1.10)

## 2011-03-27 MED ORDER — FAMOTIDINE 20 MG PO TABS: ORAL_TABLET | ORAL | Status: DC

## 2011-03-27 MED ORDER — PREDNISONE 20 MG PO TABS: ORAL_TABLET | ORAL | Status: DC

## 2011-03-27 NOTE — Progress Notes (Signed)
 HPI The patient returns for followup. Her past cardiac history is outlined in a previous note. She is being considered for take down of colostomy. I sent her for an echocardiogram which demonstrated at least moderate mitral regurgitation with a mildly reduced ejection fraction global hypokinesis.  She also had a stress perfusion study which demonstrated a moderate area of anterior ischemia. She also was found to have high-grade carotid stenosis with right 80-99% and left 60-79%. She presents for followup to discuss cardiac catheterization. She has had concerns about these in the past. She actually was treated with ablation therapy she says in the distant past with what she reports as resolution of previous coronary disease. She did have a non-Q-wave myocardial infarction with her recent illness in another state as described previously. She currently is not describing any chest discomfort, neck or arm discomfort. She's not having any shortness of breath, PND or orthopnea. However, she's limited in her activities and somewhat sedentary currently.  Allergies  Allergen Reactions  . Demerol Other (See Comments)    coma  . Cephalosporins   . Contrast Media (Iodinated Diagnostic Agents)   . Insulins   . Keflex   . Latex   . Morphine And Related   . Other     Patient highly sensitive to antibiotics, narcotics and any medication used to put patient to sleep for surgery.  . Penicillins   . Sulfa Antibiotics   . Aspirin     coma    Current Outpatient Prescriptions  Medication Sig Dispense Refill  . acetaminophen-codeine (TYLENOL #3) 300-30 MG per tablet Take 1-2 tablets by mouth every 4 hours as needed      . amiodarone (PACERONE) 200 MG tablet Take 200 mg by mouth daily.      . cetirizine (ZYRTEC) 10 MG tablet Take 10 mg by mouth daily.      . dextromethorphan (DELSYM) 30 MG/5ML liquid Take 60 mg by mouth as needed.      . lisinopril (PRINIVIL,ZESTRIL) 5 MG tablet Take 1 tablet (5 mg total) by  mouth daily.  30 tablet  11  . Melatonin 3 MG CAPS Take by mouth as needed.      . metoprolol tartrate (LOPRESSOR) 25 MG tablet Take 1/2 tablet by mouth twice daily      . naproxen sodium (ALEVE) 220 MG tablet Take 2 tablets as needed        Past Medical History  Diagnosis Date  . Coronary artery disease   . Arrhythmia     afib  . Diverticulitis of colon (without mention of hemorrhage)   . Injury to liver   . Allergic rhinitis due to pollen   . Hyperlipidemia   . Hypertension   . Bone/cartilage disorder   . Vitamin d deficiency   . Anemia, unspecified   . Arthritis   . Diabetes mellitus     boarderline  . A-fib   . Diverticulitis of colon with perforation s/p colectomy/ostomy 13Oct2012 02/23/2011    Past Surgical History  Procedure Date  . Appendectomy   . Total hip arthroplasty 07/2004    Right  . Colostomy 10/2010  . Cesarean section 1955, 1959, 1960  . Abdominal hysterectomy 1976  . Bladder repair 1976  . Abdominal adhesion surgery     at time of hysterectomy    ROS:  Decreased appetite, nausea. Otherwise as stated in the HPI and negative for all other systems.  PHYSICAL EXAM BP 129/75  Pulse 72  Ht 5' 2" (  1.575 m)  Wt 125 lb (56.7 kg)  BMI 22.86 kg/m2 GENERAL:  No distress NECK:  No jugular venous distention, waveform within normal limits, carotid upstroke brisk and symmetric, bilateral bruits, no thyromegaly LYMPHATICS:  No cervical, inguinal adenopathy LUNGS:  Clear to auscultation bilaterally BACK:  No CVA tenderness CHEST:  Unremarkable HEART:  PMI not displaced or sustained,S1 and S2 within normal limits, no S3, no S4, no clicks, no rubs, apical holosystolic murmur 3/6 with radiation to the axilla and mildly out the outflow tract. ABD:  Flat, positive bowel sounds normal in frequency in pitch, no bruits, no rebound, no guarding, no midline pulsatile mass, no hepatomegaly, no splenomegaly, colostomy left lower quadrent EXT:  2 plus pulses upper and  decreased DP/PT lower, no edema, no cyanosis no clubbing SKIN:  No rashes no nodules NEURO:  Cranial nerves II through XII grossly intact, motor grossly intact throughout PSYCH:  Cognitively intact, oriented to person place and time   ASSESSMENT AND PLAN    

## 2011-03-27 NOTE — Assessment & Plan Note (Signed)
This will be followed and is currently moderate. I will manage her cardiomyopathy medically.

## 2011-03-27 NOTE — Patient Instructions (Signed)
Your physician has requested that you have a cardiac catheterization. Cardiac catheterization is used to diagnose and/or treat various heart conditions. Doctors may recommend this procedure for a number of different reasons. The most common reason is to evaluate chest pain. Chest pain can be a symptom of coronary artery disease (CAD), and cardiac catheterization can show whether plaque is narrowing or blocking your heart's arteries. This procedure is also used to evaluate the valves, as well as measure the blood flow and oxygen levels in different parts of your heart. For further information please visit [email or web address] Please follow instruction sheet, as given.  The current medical regimen is effective;  continue present plan and medications.  Follow up will be determined after your procedure.

## 2011-03-27 NOTE — Assessment & Plan Note (Signed)
The patient has an abnormal stress test as described with recent non-Q-wave myocardial origin. She would be high risk for elective reversal of her colostomy. Cardiac catheterization is indicated. I have extensively reviewed the risks and benefits with the patient and her son. In particular I note her previous dye allergy which was 30 years ago. She agrees to proceed with elective cardiac catheterization.

## 2011-03-27 NOTE — Assessment & Plan Note (Signed)
The blood pressure is at target. No change in medications is indicated. We will continue with therapeutic lifestyle changes (TLC).  

## 2011-03-27 NOTE — Assessment & Plan Note (Signed)
She could likely discontinue the amiodarone. However, if she is to have surgery very so and it would be reasonable to continue this through surgery to try to prevent any atrial fibrillation induced by this.

## 2011-03-27 NOTE — Assessment & Plan Note (Signed)
The patient has carotid stenosis and she will be referred to vascular surgery prior to her elective abdominal surgery as well.

## 2011-03-31 ENCOUNTER — Other Ambulatory Visit: Payer: Self-pay

## 2011-03-31 DIAGNOSIS — I6529 Occlusion and stenosis of unspecified carotid artery: Secondary | ICD-10-CM

## 2011-04-03 ENCOUNTER — Telehealth: Payer: Self-pay | Admitting: Cardiology

## 2011-04-03 NOTE — Telephone Encounter (Signed)
Was calling back to talk to [employer]

## 2011-04-03 NOTE — Telephone Encounter (Signed)
Fu call °Pt's daughter calling you back °

## 2011-04-05 ENCOUNTER — Other Ambulatory Visit: Payer: Self-pay | Admitting: Cardiology

## 2011-04-05 DIAGNOSIS — I2510 Atherosclerotic heart disease of native coronary artery without angina pectoris: Secondary | ICD-10-CM

## 2011-04-06 ENCOUNTER — Encounter (HOSPITAL_BASED_OUTPATIENT_CLINIC_OR_DEPARTMENT_OTHER)
Admission: RE | Disposition: A | Discharge status: Home / Self Care | Payer: Self-pay | Source: Ambulatory Visit | Attending: Cardiology

## 2011-04-06 ENCOUNTER — Inpatient Hospital Stay (HOSPITAL_BASED_OUTPATIENT_CLINIC_OR_DEPARTMENT_OTHER)
Admission: RE | Admit: 2011-04-06 | Discharge: 2011-04-06 | Disposition: A | Discharge status: Home / Self Care | Payer: Medicare Other | Source: Ambulatory Visit | Attending: Cardiology | Admitting: Cardiology

## 2011-04-06 DIAGNOSIS — I2510 Atherosclerotic heart disease of native coronary artery without angina pectoris: Secondary | ICD-10-CM | POA: Insufficient documentation

## 2011-04-06 DIAGNOSIS — R9439 Abnormal result of other cardiovascular function study: Secondary | ICD-10-CM | POA: Insufficient documentation

## 2011-04-06 DIAGNOSIS — I2584 Coronary atherosclerosis due to calcified coronary lesion: Secondary | ICD-10-CM | POA: Insufficient documentation

## 2011-04-06 SURGERY — JV LEFT HEART CATHETERIZATION WITH CORONARY ANGIOGRAM
Anesthesia: Moderate Sedation

## 2011-04-06 MED ORDER — ACETAMINOPHEN 325 MG PO TABS: 650.0000 mg | ORAL_TABLET | ORAL | Status: DC | PRN

## 2011-04-06 MED ORDER — SODIUM CHLORIDE 0.9 % IV SOLN: INTRAVENOUS | Status: DC

## 2011-04-06 MED ORDER — ONDANSETRON HCL 4 MG/2ML IJ SOLN: 4.0000 mg | Freq: Four times a day (QID) | INTRAMUSCULAR | Status: DC | PRN

## 2011-04-06 NOTE — Progress Notes (Signed)
Apple juice given to pt.  Bedrest started at 0935am  Dr Antoine Poche in to talk to pt and family about results of test and plan of care

## 2011-04-06 NOTE — Interval H&P Note (Signed)
History and Physical Interval Note:  04/06/2011 8:49 AM  Daine Gravel  has presented today for surgery, with the diagnosis of CAD  The various methods of treatment have been discussed with the patient and family. After consideration of risks, benefits and other options for treatment, the patient has consented to  Procedure(s) (LRB): JV LEFT HEART CATHETERIZATION WITH CORONARY ANGIOGRAM (N/A) as a surgical intervention .  The patients' history has been reviewed, patient examined, no change in status, stable for surgery.  I have reviewed the patients' chart and labs.  Questions were answered to the patient's satisfaction.     Rollene Rotunda

## 2011-04-06 NOTE — OR Nursing (Signed)
Discharge instructions reviewed and signed, pt stated understanding, ambulated in hall without difficulty, site level 0, transported to son's car via wheelchair 

## 2011-04-06 NOTE — CV Procedure (Signed)
  Cardiac Catheterization Procedure Note  Name: Julia Greene MRN: [id number] DOB: 11/16/1930  Procedure: Left Heart Cath, Selective Coronary Angiography, LV angiography  Indication:  CAD, Abnormal Stress Test   Procedural details: The right groin was prepped, draped, and anesthetized with 1% lidocaine. Using modified Seldinger technique, a 5 French sheath was introduced into the right femoral artery. Standard Judkins catheters were used for coronary angiography and left ventriculography. Catheter exchanges were performed over a guidewire. There were no immediate procedural complications. The patient was transferred to the post catheterization recovery area for further monitoring.  Procedural Findings:  Hemodynamics:     AO 145/86    LV 150/23   Coronary angiography:  Coronary dominance: Right  Left mainstem:   Heavy calcification with distal 40% stenosis  Left anterior descending (LAD):   Heavy proximal calcification with ostial 70% stenosis followed by long 50% stenosis followed by occlusion after a moderate size first diagonal.  The diagonal has ostial 95% stenosis with proximal and mid diffuse 60% stenosis.  Left circumflex (LCx):  The circumflex appears to be a large vessel. It is occluded in the AV groove after the first obtuse marginal. The first obtuse marginal is a small branching vessel. There is ostial 90% stenosis. The superior branch is patent but small. The inferior branch appears to be large but is occluded. There is an extensive network of occluded marginals and posterior laterals after this that  fill to some degree by collaterals.  Ramus intermedius (RI):  This is a large vessel. There is moderate proximal calcification. There is a long 60% stenosis in the proximal and mid segment. The remainder of the vessel is free of high-grade disease.  Right coronary artery (RCA):  Long proximal 40-50% stenosis followed by occlusion after a moderate-sized acute marginal branch. The  acute marginal has long 80% ostial stenosis.  Left ventriculography: Left ventricular systolic function is normal, LVEF is estimated at 55-65%, there is moderate mitral regurgitation   Final Conclusions:  Severe diffuse three-vessel coronary artery disease.  Well preserved ejection fraction.  Recommendations:  The patient's coronary disease is long-standing severe diffuse and extensively collateralized. At this point there are no high grade lesions amenable to percutaneous revascularization. Any attempts at surgical revascularization would be very incomplete. Given this medical management and continued attempts at risk reduction is the preferred therapy. Given her extensive disease she would be at moderately high risk to hight risk for cardiovascular events with elective bowel surgery.  Rollene Rotunda 04/06/2011, 8:50 AM

## 2011-04-06 NOTE — OR Nursing (Signed)
Meal served 

## 2011-04-06 NOTE — H&P (View-Only) (Signed)
HPI The patient returns for followup. Her past cardiac history is outlined in a previous note. She is being considered for take down of colostomy. I sent her for an echocardiogram which demonstrated at least moderate mitral regurgitation with a mildly reduced ejection fraction global hypokinesis.  She also had a stress perfusion study which demonstrated a moderate area of anterior ischemia. She also was found to have high-grade carotid stenosis with right 80-99% and left 60-79%. She presents for followup to discuss cardiac catheterization. She has had concerns about these in the past. She actually was treated with ablation therapy she says in the distant past with what she reports as resolution of previous coronary disease. She did have a non-Q-wave myocardial infarction with her recent illness in [place] state as described previously. She currently is not describing any chest discomfort, neck or arm discomfort. She's not having any shortness of breath, PND or orthopnea. However, she's limited in her activities and somewhat sedentary currently.  Allergies  Allergen Reactions  . Demerol Other (See Comments)    coma  . Cephalosporins   . Contrast Media (Iodinated Diagnostic Agents)   . Insulins   . Keflex   . Latex   . Morphine And Related   . Other     Patient highly sensitive to antibiotics, narcotics and any medication used to put patient to sleep for surgery.  . Penicillins   . Sulfa Antibiotics   . Aspirin     coma    Current Outpatient Prescriptions  Medication Sig Dispense Refill  . acetaminophen-codeine (TYLENOL #3) 300-30 MG per tablet Take 1-2 tablets by mouth every 4 hours as needed      . amiodarone (PACERONE) 200 MG tablet Take 200 mg by mouth daily.      . cetirizine (ZYRTEC) 10 MG tablet Take 10 mg by mouth daily.      Marland Kitchen dextromethorphan (DELSYM) 30 MG/5ML liquid Take 60 mg by mouth as needed.      Marland Kitchen lisinopril (PRINIVIL,ZESTRIL) 5 MG tablet Take 1 tablet (5 mg total) by  mouth daily.  30 tablet  11  . Melatonin 3 MG CAPS Take by mouth as needed.      . metoprolol tartrate (LOPRESSOR) 25 MG tablet Take 1/2 tablet by mouth twice daily      . naproxen sodium (ALEVE) 220 MG tablet Take 2 tablets as needed        Past Medical History  Diagnosis Date  . Coronary artery disease   . Arrhythmia     afib  . Diverticulitis of colon (without mention of hemorrhage)   . Injury to liver   . Allergic rhinitis due to pollen   . Hyperlipidemia   . Hypertension   . Bone/cartilage disorder   . Vitamin d deficiency   . Anemia, unspecified   . Arthritis   . Diabetes mellitus     boarderline  . A-fib   . Diverticulitis of colon with perforation s/p colectomy/ostomy 13Oct2012 02/23/2011    Past Surgical History  Procedure Date  . Appendectomy   . Total hip arthroplasty 07/2004    Right  . Colostomy 10/2010  . Cesarean section 1955, 1959, 1960  . Abdominal hysterectomy 1976  . Bladder repair 1976  . Abdominal adhesion surgery     at time of hysterectomy    ROS:  Decreased appetite, nausea. Otherwise as stated in the HPI and negative for all other systems.  PHYSICAL EXAM BP 129/75  Pulse 72  Ht 5\' 2"  (  1.575 m)  Wt 125 lb (56.7 kg)  BMI 22.86 kg/m2 GENERAL:  No distress NECK:  No jugular venous distention, waveform within normal limits, carotid upstroke brisk and symmetric, bilateral bruits, no thyromegaly LYMPHATICS:  No cervical, inguinal adenopathy LUNGS:  Clear to auscultation bilaterally BACK:  No CVA tenderness CHEST:  Unremarkable HEART:  PMI not displaced or sustained,S1 and S2 within normal limits, no S3, no S4, no clicks, no rubs, apical holosystolic murmur 3/6 with radiation to the axilla and mildly out the outflow tract. ABD:  Flat, positive bowel sounds normal in frequency in pitch, no bruits, no rebound, no guarding, no midline pulsatile mass, no hepatomegaly, no splenomegaly, colostomy left lower quadrent EXT:  2 plus pulses upper and  decreased DP/PT lower, no edema, no cyanosis no clubbing SKIN:  No rashes no nodules NEURO:  Cranial nerves II through XII grossly intact, motor grossly intact throughout PSYCH:  Cognitively intact, oriented to person place and time   ASSESSMENT AND PLAN

## 2011-04-07 ENCOUNTER — Telehealth (INDEPENDENT_AMBULATORY_CARE_PROVIDER_SITE_OTHER): Payer: Self-pay

## 2011-04-07 ENCOUNTER — Encounter (INDEPENDENT_AMBULATORY_CARE_PROVIDER_SITE_OTHER): Payer: Self-pay

## 2011-04-07 NOTE — Telephone Encounter (Signed)
LMOM for pt's daughter n law to call me back to discuss about the cardiac clearance.

## 2011-04-07 NOTE — Telephone Encounter (Signed)
Spoke to pt's daughter and advised them that DR Antoine Poche did speak with Dr Michaell Cowing about the pt wanting to get scheduled for a colostomy takedown but the pt is too high of a risk for sx. I did advise that the pt would not benefit from the sx being such at a high risk. The daughter asked if we would write a letter to the pt so she could see the info in writing. I agreed to mail the pt a letter.

## 2011-04-13 ENCOUNTER — Telehealth: Payer: Self-pay | Admitting: Cardiology

## 2011-04-13 NOTE — Telephone Encounter (Signed)
Please return call to Aryiana Klinkner Daughter [phone] regarding patient breathing and 3/11 cath.  Patient also c/o of pain and bruising.

## 2011-04-13 NOTE — Telephone Encounter (Signed)
Per daughter - bruising at cath site has increased in size and is moving down pt's leg.  Explained to daughter that this is normal and as it heals it will continue to change in shape and in color.  Pt was also complaining of pain in her hip however the daughter feels as though this is more related to her 78 year old hip replacement rather than to the cath site.  The cath site is without redness, warmth, swelling and drainage.  Daughter will continue to monitor site and call back if further concerns.  Of note daughter is concerned about pt wanting to drive and does not feel as though the pt is stable enough to do so.  The pt is wanting to be able to drive again.  The daughter has asked me to make Dr Antoine Poche aware of the situation.  Will forward to him.

## 2011-04-20 ENCOUNTER — Ambulatory Visit (INDEPENDENT_AMBULATORY_CARE_PROVIDER_SITE_OTHER): Payer: Medicare Other | Admitting: Physician Assistant

## 2011-04-20 ENCOUNTER — Encounter: Payer: Self-pay | Admitting: Physician Assistant

## 2011-04-20 VITALS — BP 122/62 | HR 46 | Ht 62.0 in | Wt 126.8 lb

## 2011-04-20 DIAGNOSIS — I059 Rheumatic mitral valve disease, unspecified: Secondary | ICD-10-CM

## 2011-04-20 DIAGNOSIS — I219 Acute myocardial infarction, unspecified: Secondary | ICD-10-CM

## 2011-04-20 DIAGNOSIS — K5732 Diverticulitis of large intestine without perforation or abscess without bleeding: Secondary | ICD-10-CM

## 2011-04-20 DIAGNOSIS — K5720 Diverticulitis of large intestine with perforation and abscess without bleeding: Secondary | ICD-10-CM

## 2011-04-20 DIAGNOSIS — I4891 Unspecified atrial fibrillation: Secondary | ICD-10-CM

## 2011-04-20 DIAGNOSIS — Z01810 Encounter for preprocedural cardiovascular examination: Secondary | ICD-10-CM

## 2011-04-20 DIAGNOSIS — I10 Essential (primary) hypertension: Secondary | ICD-10-CM

## 2011-04-20 DIAGNOSIS — I6529 Occlusion and stenosis of unspecified carotid artery: Secondary | ICD-10-CM

## 2011-04-20 DIAGNOSIS — I340 Nonrheumatic mitral (valve) insufficiency: Secondary | ICD-10-CM

## 2011-04-20 DIAGNOSIS — I2510 Atherosclerotic heart disease of native coronary artery without angina pectoris: Secondary | ICD-10-CM

## 2011-04-20 MED ORDER — NITROGLYCERIN 0.4 MG SL SUBL: 0.4000 mg | SUBLINGUAL_TABLET | SUBLINGUAL | Status: DC | PRN

## 2011-04-20 NOTE — Patient Instructions (Signed)
Your physician recommends that you schedule a follow-up appointment in: 6-8 WEEKS WITH DR. [facility]  Your physician has recommended you make the following change in your medication: STOP AMIODARONE;  A PRESCRIPTION FOR NITROGLYCERIN 0.4 SL HAS BEEN SENT IN TODAY FOR YOU, KEEP TAKING PEPCID 20 MG 1 TABLET TWICE DAILY; START ASPIRIN 81 MG EVERY OTHER DAY

## 2011-04-20 NOTE — Progress Notes (Signed)
[address] [place], [place]  [address] Phone: [phone] Fax:  [phone]  Date:  04/20/2011   Name:  Julia Greene       DOB:  09-23-1930 MRN:  [id number]  PCP:  Dr. Oneal Grout Primary Cardiologist:  Dr. Rollene Rotunda  Primary Electrophysiologist:  None    History of Present Illness: Julia Greene is a 78 y.o. female who presents for post cath follow up.  She saw Dr. Antoine Poche initially 01/2011.  She moved here from [place].  She had a NSTEMI in the setting of atrial fibrillation and perforated diverticulum with peritoneal abscess.  There was a question of whether or not she would proceed with takedown of her colostomy prior to moving back to [place].  An echocardiogram was ordered 02/26/11: Septal and apical hypokinesis, moderate LVE, EF 45-50%, posterior MAC with bileaflet mitral valve prolapse, posteriorly directed eccentric MR with moderate MR, mild BAE.  Myoview 03/12/11: EF 57%, moderate ischemia in the entire septum and possibly a small part of the anterior wall.  She had a carotid bruit and carotid Dopplers demonstrated RICA 80-99% and LICA 60-79%.  She was referred to vascular surgery.  Cardiac catheterization was also arranged.  LHC 04/06/11: Coronary dominance: Right  Left mainstem: Heavy calcification with distal 40% stenosis  Left anterior descending (LAD): Heavy proximal calcification with ostial 70% stenosis followed by long 50% stenosis followed by occlusion after a moderate size first diagonal. The diagonal has ostial 95% stenosis with proximal and mid diffuse 60% stenosis.  Left circumflex (LCx): The circumflex appears to be a large vessel. It is occluded in the AV groove after the first obtuse marginal. The first obtuse marginal is a small branching vessel. There is ostial 90% stenosis. The superior branch is patent but small. The inferior branch appears to be large but is occluded. There is an extensive network of occluded  marginals and posterior laterals after this that fill to some degree by collaterals.  Ramus intermedius (RI): This is a large vessel. There is moderate proximal calcification. There is a long 60% stenosis in the proximal and mid segment. The remainder of the vessel is free of high-grade disease.  Right coronary artery (RCA): Long proximal 40-50% stenosis followed by occlusion after a moderate-sized acute marginal branch. The acute marginal has long 80% ostial stenosis.  Left ventriculography: Left ventricular systolic function is normal, LVEF is estimated at 55-65%, there is moderate mitral regurgitation  Final Conclusions: Severe diffuse three-vessel coronary artery disease. Well preserved ejection fraction.  Recommendations: The patient's coronary disease is long-standing severe diffuse and extensively collateralized. At this point there are no high grade lesions amenable to percutaneous revascularization. Any attempts at surgical revascularization would be very incomplete. Given this medical management and continued attempts at risk reduction is the preferred therapy. Given her extensive disease she would be at moderately high risk to hight risk for cardiovascular events with elective bowel surgery.  She is doing well.  She has occasional chest tightness when she gets "excited."  She denies significant dyspnea.  She denies syncope.  She does with that and, PND or edema.  Past Medical History  Diagnosis Date  . Coronary artery disease     h/o NSTEMI in setting of AFib/sepsis ([place]  LHC 04/06/11:  dLM 40%, oLAD 70%, then long 50%, then occluded, oDx 95%, p-mDx 60%, AVCFX occluded, oOM 90% with inf branch occluded, ext network of occluded OMs and PLs (fill s/w by collats), p+mRI  60%,, pRCA 40-50%, oAM 80%, EF 55-65%   . Atrial fibrillation     in setting of sepsis in [place]; tx with amiodarone (amio stopped 04/20/11);  Echo 02/26/11: Septal and apical hypokinesis, moderate LVE, EF 45-50%, posterior MAC  with bileaflet mitral valve prolapse, posteriorly directed eccentric MR with moderate MR, mild BAE.  . Diverticulitis of colon (without mention of hemorrhage)   . Injury to liver   . Allergic rhinitis due to pollen   . Hyperlipidemia   . Hypertension   . Bone/cartilage disorder   . Vitamin d deficiency   . Anemia, unspecified   . Arthritis   . Diabetes mellitus     boarderline  . Diverticulitis of colon with perforation s/p colectomy/ostomy 13Oct2012 02/23/2011  . Carotid stenosis     02/2011 - RICA 80-99% and LICA 60-79%    Current Outpatient Prescriptions  Medication Sig Dispense Refill  . amiodarone (PACERONE) 200 MG tablet Take 200 mg by mouth daily.      . cetirizine (ZYRTEC) 10 MG tablet Take 10 mg by mouth daily.      . Cholecalciferol (VITAMIN D-3 PO) Take by mouth daily.      Marland Kitchen dextromethorphan (DELSYM) 30 MG/5ML liquid Take 60 mg by mouth as needed.      . famotidine (PEPCID) 20 MG tablet Take one tablet the evening before your procedure and the morning of.  2 tablet  0  . HYDROcodone-acetaminophen (LORTAB) 10-500 MG per tablet Take 1 tablet by mouth every 6 (six) hours as needed.      Marland Kitchen lisinopril (PRINIVIL,ZESTRIL) 5 MG tablet Take 1 tablet (5 mg total) by mouth daily.  30 tablet  11  . Melatonin 3 MG CAPS Take by mouth as needed.      . metoprolol tartrate (LOPRESSOR) 25 MG tablet Take 1/2 tablet by mouth twice daily      . Vitamin D, Ergocalciferol, (DRISDOL) 50000 UNITS CAPS Take 50,000 Units by mouth every 7 (seven) days.        Allergies: Allergies  Allergen Reactions  . Demerol Other (See Comments)    coma  . Cephalosporins   . Contrast Media (Iodinated Diagnostic Agents)   . Insulins   . Keflex   . Latex   . Morphine And Related   . Other     Patient highly sensitive to antibiotics, narcotics and any medication used to put patient to sleep for surgery.  . Penicillins   . Sulfa Antibiotics   . Aspirin     coma    History  Substance Use Topics  .  Smoking status: Never Smoker   . Smokeless tobacco: Never Used  . Alcohol Use: No     ROS:  Please see the history of present illness.   All other systems reviewed and negative.   PHYSICAL EXAM: VS:  BP 122/62  Pulse 46  Ht 5\' 2"  (1.575 m)  Wt 126 lb 12.8 oz (57.516 kg)  BMI 23.19 kg/m2 Well nourished, well developed, in no acute distress HEENT: normal Neck: no JVD Cardiac:  normal S1, S2; RRR; 2/6 systolic murmur along LLSB Lungs:  clear to auscultation bilaterally, no wheezing, rhonchi or rales Abd: soft, nontender, no hepatomegaly Ext: no edema; right groin without hematoma or bruit  Skin: warm and dry Neuro:  CNs 2-12 intact, no focal abnormalities noted  EKG:  Sinus bradycardia, heart rate 46, normal axis, no ischemic changes  ASSESSMENT AND PLAN:  1. CAD (coronary artery disease)  Overall stable.  She does have some symptoms that sound consistent with stable angina.  We discussed whether or not to add isosorbide to her medical regimen.  She does not feel that her symptoms limit her that much.  I did provide her with a prescription for SL nitroglycerin to be used as needed.  She does not take aspirin due to GI upset.  We had a long discussion about this.  She denies a true allergy.  I've asked her to try to take aspirin 81 mg every other day.  She had recently been placed on Pepcid.  I've asked her to continue taking this twice a day to see if it helps a limited GI upset.  She is intolerant to all statins.  Followup with Dr. Antoine Poche in 2 months.   2. Atrial fibrillation  Maintaining sinus rhythm.  She is bradycardic.  I discussed with Dr. Antoine Poche.  We will stop her amiodarone.   3. HTN (hypertension)  Controlled.  Continue current therapy.   4. Mitral regurgitation  Moderate by echo.  She will need continued followup   5. Diverticulitis of colon with perforation s/p colectomy/ostomy 13Oct2012  As noted, she is too high risk for colostomy takedown.  This was again  discussed with the patient and her daughter today.   6. Carotid stenosis  She has a vascular surgery appointment pending.           Signed, Tereso Newcomer, PA-C  11:55 AM 04/20/2011

## 2011-04-30 ENCOUNTER — Encounter: Payer: Self-pay | Admitting: Physician Assistant

## 2011-05-20 ENCOUNTER — Encounter: Payer: Self-pay | Admitting: Vascular Surgery

## 2011-05-21 ENCOUNTER — Other Ambulatory Visit (INDEPENDENT_AMBULATORY_CARE_PROVIDER_SITE_OTHER): Payer: Medicare Other | Admitting: *Deleted

## 2011-05-21 ENCOUNTER — Encounter: Payer: Self-pay | Admitting: Vascular Surgery

## 2011-05-21 ENCOUNTER — Ambulatory Visit (INDEPENDENT_AMBULATORY_CARE_PROVIDER_SITE_OTHER): Payer: Medicare Other | Admitting: Vascular Surgery

## 2011-05-21 VITALS — BP 127/95 | HR 54 | Resp 16 | Ht 62.0 in | Wt 124.2 lb

## 2011-05-21 DIAGNOSIS — I6529 Occlusion and stenosis of unspecified carotid artery: Secondary | ICD-10-CM

## 2011-05-21 NOTE — Progress Notes (Signed)
History of Present Illness:  Patient is a 78 y.o. year old female who presents for evaluation of carotid stenosis.  The patient denies symptoms of TIA, amaurosis, or stroke.  The patient is currently on aspirin antiplatelet therapy.  The carotid stenosis was found on screening duplex.  Other medical problems include three-vessel coronary artery disease with collateralization.  She has angina-type symptoms 3 times per month relieved by nitroglycerin, hypertension, hyperlipidemia, atrial fibrillation. These are currently stable and followed by Dr. Antoine Poche and her primary care physician.  She was recently thought to be too high risk for a colostomy takedown operation due to her coronary artery disease. I spoke with Dr. Antoine Poche by phone today. He thinks she would be at moderate risk for myocardial events perioperatively.  Past Medical History  Diagnosis Date  . Coronary artery disease     h/o NSTEMI in setting of AFib/sepsis (in [place]  LHC 04/06/11:  dLM 40%, oLAD 70%, then long 50%, then occluded, oDx 95%, p-mDx 60%, AVCFX occluded, oOM 90% with inf branch occluded, ext network of occluded OMs and PLs (fill s/w by collats), p+mRI 60%,, pRCA 40-50%, oAM 80%, EF 55-65%   . Atrial fibrillation     in setting of sepsis in [place]; tx with amiodarone (amio stopped 04/20/11);  Echo 02/26/11: Septal and apical hypokinesis, moderate LVE, EF 45-50%, posterior MAC with bileaflet mitral valve prolapse, posteriorly directed eccentric MR with moderate MR, mild BAE.  . Diverticulitis of colon (without mention of hemorrhage)   . Injury to liver   . Allergic rhinitis due to pollen   . Hyperlipidemia   . Hypertension   . Bone/cartilage disorder   . Vitamin d deficiency   . Anemia, unspecified   . Arthritis   . Diabetes mellitus     boarderline  . Diverticulitis of colon with perforation s/p colectomy/ostomy 13Oct2012 02/23/2011  . Carotid stenosis     02/2011 - RICA 80-99% and LICA 60-79%  . NSTEMI (non-ST elevated  myocardial infarction) 10/2010    Echo 10/2010 (at [facility] in [facility]):  mid ot apical septal AK, EF 40-45%, MAC, mild to mod MR, mild TR, no pulmo HTN    Past Surgical History  Procedure Date  . Appendectomy   . Total hip arthroplasty 07/2004    Right  . Colostomy 10/2010  . Cesarean section 1955, 1959, 1960  . Abdominal hysterectomy 1976  . Bladder repair 1976  . Abdominal adhesion surgery     at time of hysterectomy  . Joint replacement     Right  Hip     Social History History  Substance Use Topics  . Smoking status: Never Smoker   . Smokeless tobacco: Never Used  . Alcohol Use: No    Family History Family History  Problem Relation Age of Onset  . Heart disease Mother 81    Pacemaker, No CAD Heart Disease before age 26  . Diverticulitis Mother   . Diabetes Mother   . Hyperlipidemia Mother   . Hypertension Mother   . Heart attack Mother   . Lung disease Father 56    Smoking  . Ovarian cancer Daughter   . Cancer Daughter   . Colon cancer Neg Hx   . Heart disease Maternal Grandmother   . Diabetes Maternal Aunt     x 2  . Diabetes Sister   . Heart disease Sister     Heart Disease before age 85  . Hyperlipidemia Sister   . Hypertension Sister   .  Heart attack Sister   . Diabetes Sister     Allergies  Allergies  Allergen Reactions  . Demerol Other (See Comments)    coma  . Cephalosporins   . Contrast Media (Iodinated Diagnostic Agents)   . Insulins   . Keflex   . Latex   . Morphine And Related   . Other     Patient highly sensitive to antibiotics, narcotics and any medication used to put patient to sleep for surgery.  . Penicillins   . Sulfa Antibiotics   . Aspirin     coma     Current Outpatient Prescriptions  Medication Sig Dispense Refill  . aspirin EC 81 MG tablet Take 1 tablet (81 mg total) by mouth every other day.      . cetirizine (ZYRTEC) 10 MG tablet Take 10 mg by mouth daily.      . Cholecalciferol (VITAMIN D-3 PO) Take by  mouth daily.      Marland Kitchen dextromethorphan (DELSYM) 30 MG/5ML liquid Take 60 mg by mouth as needed.      . famotidine (PEPCID) 20 MG tablet Take 1 tablet (20 mg total) by mouth 2 (two) times daily.      Marland Kitchen HYDROcodone-acetaminophen (LORTAB) 10-500 MG per tablet Take 1 tablet by mouth every 6 (six) hours as needed.      Marland Kitchen lisinopril (PRINIVIL,ZESTRIL) 5 MG tablet Take 1 tablet (5 mg total) by mouth daily.  30 tablet  11  . Melatonin 3 MG CAPS Take by mouth as needed.      . metoprolol tartrate (LOPRESSOR) 25 MG tablet Take 1/2 tablet by mouth twice daily      . nitroGLYCERIN (NITROSTAT) 0.4 MG SL tablet Place 1 tablet (0.4 mg total) under the tongue every 5 (five) minutes as needed for chest pain.  25 tablet  3  . Vitamin D, Ergocalciferol, (DRISDOL) 50000 UNITS CAPS Take 50,000 Units by mouth every 7 (seven) days.      Marland Kitchen DISCONTD: amiodarone (PACERONE) 200 MG tablet Take 200 mg by mouth daily.        ROS:   General:  No weight loss, Fever, chills  HEENT: No recent headaches, no nasal bleeding, no visual changes, no sore throat  Neurologic: No dizziness, blackouts, seizures. No recent symptoms of stroke or mini- stroke. No recent episodes of slurred speech, or temporary blindness.  Cardiac: + recent episodes of chest pain/pressure, no shortness of breath at rest.  No shortness of breath with exertion.   Vascular: No history of rest pain in feet.  No history of claudication.  No history of non-healing ulcer, No history of DVT   Pulmonary: No home oxygen, no productive cough, no hemoptysis,  No asthma or wheezing  Musculoskeletal:  [x ] Arthritis, [ ]  Low back pain,  [ ]  Joint pain  Hematologic:No history of hypercoagulable state.  No history of easy bleeding.  No history of anemia  Gastrointestinal: No hematochezia or melena,  No gastroesophageal reflux, no trouble swallowing  Urinary: [ ]  chronic Kidney disease, [ ]  on HD - [ ]  MWF or [ ]  TTHS, [ ]  Burning with urination, [ ]  Frequent  urination, [ ]  Difficulty urinating;   Skin: No rashes  Psychological: No history of anxiety,  No history of depression   Physical Examination  Filed Vitals:   05/21/11 1404  BP: 127/95  Pulse: 54  Resp: 16  Height: 5\' 2"  (1.575 m)  Weight: 124 lb 3.2 oz (56.337 kg)  SpO2: 98%  Body mass index is 22.72 kg/(m^2).  General:  Alert and oriented, no acute distress HEENT: Normal Neck: Bilateral carotid bruits right greater than left Pulmonary: Clear to auscultation bilaterally Cardiac: Regular Rate and Rhythm without murmur Gastrointestinal: Soft, non-tender, non-distended, no mass, left lower quadrant colostomy Skin: No rash Extremity Pulses:  2+ femoral pulses bilaterally Musculoskeletal: No deformity or edema  Neurologic: Upper and lower extremity motor 5/5 and symmetric  DATA: Patient had a carotid duplex scan today which I reviewed and interpreted. This shows a high-grade stenosis on the right side with a peak systolic velocity of 631 cm/s and end-diastolic velocity of 152 cm/s. His falls and are greater than 80% category. She had a 40-60% left internal carotid artery stenosis but with calcifications of the stenosis may actually be higher   ASSESSMENT: High-grade right internal carotid artery stenosis. Asymptomatic. I had a lengthy discussion with the patient and her son today. Risks benefits possible complications and procedure details of right carotid endarterectomy were explained to the patient and her son today. I explained to them that her stroke risk with out any intervention would be 5% per year. I explained that the stroke risk of carotid endarterectomy would be 1-2%. However she would be at least moderate risk for cardiac events which could be as small as enzymatic changes versus full blown symptomatic myocardial infarction. The patient is currently convinced that she thinks she needs chelation therapy for treatment for her atherosclerosis. I did discuss with the patient  that there is no scientific evidence proving that chelation therapy is beneficial for this condition. I spoke with her and her son at length stating that if she wished to proceed with carotid endarterectomy we could consider that in the near future accepting the cardiac risk. Otherwise we will see her again in 6 months time for repeat carotid duplex exam. Certainly if she develops symptoms related to her carotid stenosis she would be at higher stroke risk and we would definitely need to consider carotid endarterectomy.   PLAN: See above   Fabienne Bruns, MD [facility] Office: [phone] Pager: [phone]

## 2011-05-29 NOTE — Procedures (Unsigned)
CAROTID DUPLEX EXAM  INDICATION:  Carotid artery stenosis.  HISTORY: Diabetes:  Borderline. Cardiac: Hypertension:  Yes. Smoking:  No. Previous Surgery:  Perforated colon/colostomy bag. CV History:  Asymptomatic. Amaurosis Fugax No, Paresthesias No, Hemiparesis No.                                      RIGHT             LEFT Brachial systolic pressure:         140               132 Brachial Doppler waveforms:         Triphasic         Triphasic Vertebral direction of flow:        Antegrade         Antegrade DUPLEX VELOCITIES (cm/sec) CCA peak systolic                   69                98 ECA peak systolic                   169               153 ICA peak systolic                   631               170 (slight curve) ICA end diastolic                   152               41 PLAQUE MORPHOLOGY:                  Mixed, irregular  Mixed, irregular PLAQUE AMOUNT:                      Severe            Moderate PLAQUE LOCATION:                    Bifurcation/ICA   Bifurcation/ICA  IMPRESSION: 1. 80% to 99% right internal carotid artery stenosis. 2. 40% to 59% left internal carotid artery stenosis. 3. Note:  Calcified plaque may obscure higher velocities. 4. Vertebral artery flow antegrade bilaterally.  ___________________________________________ Janetta Hora Fields, MD  SS/MEDQ  D:  05/21/2011  T:  05/21/2011  Job:  [id number]

## 2011-06-02 ENCOUNTER — Encounter: Payer: Self-pay | Admitting: Cardiology

## 2011-06-02 ENCOUNTER — Ambulatory Visit (INDEPENDENT_AMBULATORY_CARE_PROVIDER_SITE_OTHER): Payer: Medicare Other | Admitting: Cardiology

## 2011-06-02 VITALS — BP 110/50 | HR 59 | Ht 62.0 in | Wt 121.8 lb

## 2011-06-02 DIAGNOSIS — K5720 Diverticulitis of large intestine with perforation and abscess without bleeding: Secondary | ICD-10-CM

## 2011-06-02 DIAGNOSIS — I6529 Occlusion and stenosis of unspecified carotid artery: Secondary | ICD-10-CM

## 2011-06-02 DIAGNOSIS — K5732 Diverticulitis of large intestine without perforation or abscess without bleeding: Secondary | ICD-10-CM

## 2011-06-02 DIAGNOSIS — I219 Acute myocardial infarction, unspecified: Secondary | ICD-10-CM

## 2011-06-02 DIAGNOSIS — I4891 Unspecified atrial fibrillation: Secondary | ICD-10-CM

## 2011-06-02 MED ORDER — ISOSORBIDE MONONITRATE 15 MG HALF TABLET: 15.0000 mg | ORAL_TABLET | Freq: Every day | ORAL | Status: DC

## 2011-06-02 MED ORDER — ISOSORBIDE MONONITRATE ER 30 MG PO TB24: 15.0000 mg | ORAL_TABLET | ORAL | Status: DC

## 2011-06-02 NOTE — Assessment & Plan Note (Signed)
Although she initially opted for chelation therapy. She would now consider surgery.  She is going out of town for a couple of months and doesn't want to consider it until then. I told her to contact Dr. Darrick Penna when she returns to town.

## 2011-06-02 NOTE — Assessment & Plan Note (Signed)
Her amiodarone has been discontinued and she seems to be maintaining sinus rhythm.

## 2011-06-02 NOTE — Patient Instructions (Signed)
Please start Isosorbide 15 mg a day.  Continue all other medications as listed  Follow up in 4 months with Dr Antoine Poche

## 2011-06-02 NOTE — Progress Notes (Signed)
HPI The patient returns for followup of CAD and carotid stenosis.  She's been managed medically for her severe three-vessel coronary artery disease. She did see Dr. Darrick Penna for management of her carotid stenosis. However, at this time she declined surgery and is choosing possible chelation therapy.  Since I last saw her she has had occasional chest discomfort waking her from her sleep. She has taken one nitroglycerin on 3 separate occasions. She does some mild house activities and does not bring on the chest discomfort with this. She has a chronic nonproductive cough but denies PND or orthopnea. She has had no palpitations, presyncope or syncope.  Allergies  Allergen Reactions  . Demerol Other (See Comments)    coma  . Cephalexin   . Cephalosporins   . Contrast Media (Iodinated Diagnostic Agents)   . Insulins   . Latex   . Morphine And Related   . Other     Patient highly sensitive to antibiotics, narcotics and any medication used to put patient to sleep for surgery.  . Penicillins   . Sulfa Antibiotics   . Aspirin     coma    Current Outpatient Prescriptions  Medication Sig Dispense Refill  . acetaminophen-codeine (TYLENOL #3) 300-30 MG per tablet Take 1 tablet by mouth every 4 (four) hours as needed.      Marland Kitchen aspirin EC 81 MG tablet Take 1 tablet (81 mg total) by mouth every other day.      Marland Kitchen atorvastatin (LIPITOR) 20 MG tablet Take 20 mg by mouth daily.      Marland Kitchen dextromethorphan (DELSYM) 30 MG/5ML liquid Take 60 mg by mouth as needed.      Marland Kitchen lisinopril (PRINIVIL,ZESTRIL) 5 MG tablet Take 1 tablet (5 mg total) by mouth daily.  30 tablet  11  . Melatonin 3 MG CAPS Take by mouth as needed.      . metoprolol tartrate (LOPRESSOR) 25 MG tablet Take 1/2 tablet by mouth twice daily      . nitroGLYCERIN (NITROSTAT) 0.4 MG SL tablet Place 1 tablet (0.4 mg total) under the tongue every 5 (five) minutes as needed for chest pain.  25 tablet  3  . omeprazole (PRILOSEC) 10 MG capsule Take 10 mg  by mouth daily.      Marland Kitchen DISCONTD: amiodarone (PACERONE) 200 MG tablet Take 200 mg by mouth daily.        Past Medical History  Diagnosis Date  . Coronary artery disease     h/o NSTEMI in setting of AFib/sepsis (in [place]  LHC 04/06/11:  dLM 40%, oLAD 70%, then long 50%, then occluded, oDx 95%, p-mDx 60%, AVCFX occluded, oOM 90% with inf branch occluded, ext network of occluded OMs and PLs (fill s/w by collats), p+mRI 60%,, pRCA 40-50%, oAM 80%, EF 55-65%   . Atrial fibrillation     in setting of sepsis in [place]; tx with amiodarone (amio stopped 04/20/11);  Echo 02/26/11: Septal and apical hypokinesis, moderate LVE, EF 45-50%, posterior MAC with bileaflet mitral valve prolapse, posteriorly directed eccentric MR with moderate MR, mild BAE.  . Diverticulitis of colon (without mention of hemorrhage)   . Injury to liver   . Allergic rhinitis due to pollen   . Hyperlipidemia   . Hypertension   . Bone/cartilage disorder   . Vitamin d deficiency   . Anemia, unspecified   . Arthritis   . Diabetes mellitus     boarderline  . Diverticulitis of colon with perforation s/p colectomy/ostomy 13Oct2012 02/23/2011  .  Carotid stenosis     02/2011 - RICA 80-99% and LICA 60-79%  . NSTEMI (non-ST elevated myocardial infarction) 10/2010    Echo 10/2010 (at [facility] in [facility]):  mid ot apical septal AK, EF 40-45%, MAC, mild to mod MR, mild TR, no pulmo HTN    Past Surgical History  Procedure Date  . Appendectomy   . Total hip arthroplasty 07/2004    Right  . Colostomy 10/2010  . Cesarean section 1955, 1959, 1960  . Abdominal hysterectomy 1976  . Bladder repair 1976  . Abdominal adhesion surgery     at time of hysterectomy  . Joint replacement     Right  Hip    ROS:  As stated in the HPI and negative for all other systems.  PHYSICAL EXAM BP 110/50  Pulse 59  Ht 5\' 2"  (1.575 m)  Wt 121 lb 12.8 oz (55.248 kg)  BMI 22.28 kg/m2 GENERAL:  No distress NECK:  No jugular venous distention, waveform  within normal limits, carotid upstroke brisk and symmetric, bilateral bruits, no thyromegaly LYMPHATICS:  No cervical, inguinal adenopathy LUNGS:  Clear to auscultation bilaterally BACK:  No CVA tenderness CHEST:  Unremarkable HEART:  PMI not displaced or sustained,S1 and S2 within normal limits, no S3, no S4, no clicks, no rubs, apical holosystolic murmur 3/6 with radiation to the axilla and mildly out the outflow tract. ABD:  Flat, positive bowel sounds normal in frequency in pitch, no bruits, no rebound, no guarding, no midline pulsatile mass, no hepatomegaly, no splenomegaly, colostomy left lower quadrent EXT:  2 plus pulses upper and decreased DP/PT lower, no edema, no cyanosis no clubbing SKIN:  No rashes no nodules NEURO:  Cranial nerves II through XII grossly intact, motor grossly intact throughout PSYCH:  Cognitively intact, oriented to person place and time   ASSESSMENT AND PLAN

## 2011-06-02 NOTE — Assessment & Plan Note (Signed)
She has opted not to have surgery to reverse this as this would be risky from a cardiovascular standpoint.

## 2011-06-02 NOTE — Assessment & Plan Note (Signed)
I will add a low dose of Imdur to her regimen. She's been sensitive to medications so we'll titrate slowly.

## 2011-06-24 ENCOUNTER — Encounter: Payer: Self-pay | Admitting: Cardiology

## 2011-09-30 ENCOUNTER — Emergency Department (HOSPITAL_COMMUNITY): Payer: Medicare Other

## 2011-09-30 ENCOUNTER — Encounter (HOSPITAL_COMMUNITY): Payer: Self-pay

## 2011-09-30 ENCOUNTER — Inpatient Hospital Stay (HOSPITAL_COMMUNITY)
Admission: EM | Admit: 2011-09-30 | Discharge: 2011-10-02 | DRG: 303 | Disposition: A | Discharge status: Home / Self Care | Payer: Medicare Other | Attending: Cardiology | Admitting: Cardiology

## 2011-09-30 ENCOUNTER — Telehealth: Payer: Self-pay | Admitting: Cardiology

## 2011-09-30 DIAGNOSIS — I2510 Atherosclerotic heart disease of native coronary artery without angina pectoris: Principal | ICD-10-CM

## 2011-09-30 DIAGNOSIS — I10 Essential (primary) hypertension: Secondary | ICD-10-CM | POA: Diagnosis present

## 2011-09-30 DIAGNOSIS — L97509 Non-pressure chronic ulcer of other part of unspecified foot with unspecified severity: Secondary | ICD-10-CM | POA: Diagnosis present

## 2011-09-30 DIAGNOSIS — D649 Anemia, unspecified: Secondary | ICD-10-CM | POA: Diagnosis present

## 2011-09-30 DIAGNOSIS — I252 Old myocardial infarction: Secondary | ICD-10-CM

## 2011-09-30 DIAGNOSIS — R7309 Other abnormal glucose: Secondary | ICD-10-CM | POA: Diagnosis present

## 2011-09-30 DIAGNOSIS — R946 Abnormal results of thyroid function studies: Secondary | ICD-10-CM | POA: Diagnosis present

## 2011-09-30 DIAGNOSIS — I6529 Occlusion and stenosis of unspecified carotid artery: Secondary | ICD-10-CM | POA: Diagnosis present

## 2011-09-30 DIAGNOSIS — E785 Hyperlipidemia, unspecified: Secondary | ICD-10-CM | POA: Diagnosis present

## 2011-09-30 DIAGNOSIS — E559 Vitamin D deficiency, unspecified: Secondary | ICD-10-CM | POA: Diagnosis present

## 2011-09-30 DIAGNOSIS — Z79899 Other long term (current) drug therapy: Secondary | ICD-10-CM

## 2011-09-30 DIAGNOSIS — I658 Occlusion and stenosis of other precerebral arteries: Secondary | ICD-10-CM | POA: Diagnosis present

## 2011-09-30 DIAGNOSIS — I219 Acute myocardial infarction, unspecified: Secondary | ICD-10-CM

## 2011-09-30 DIAGNOSIS — Z7982 Long term (current) use of aspirin: Secondary | ICD-10-CM

## 2011-09-30 DIAGNOSIS — M129 Arthropathy, unspecified: Secondary | ICD-10-CM | POA: Diagnosis present

## 2011-09-30 DIAGNOSIS — I200 Unstable angina: Secondary | ICD-10-CM

## 2011-09-30 DIAGNOSIS — R059 Cough, unspecified: Secondary | ICD-10-CM | POA: Diagnosis present

## 2011-09-30 DIAGNOSIS — L97519 Non-pressure chronic ulcer of other part of right foot with unspecified severity: Secondary | ICD-10-CM

## 2011-09-30 DIAGNOSIS — R05 Cough: Secondary | ICD-10-CM | POA: Diagnosis present

## 2011-09-30 DIAGNOSIS — Z96649 Presence of unspecified artificial hip joint: Secondary | ICD-10-CM

## 2011-09-30 DIAGNOSIS — Z933 Colostomy status: Secondary | ICD-10-CM

## 2011-09-30 DIAGNOSIS — I4891 Unspecified atrial fibrillation: Secondary | ICD-10-CM | POA: Diagnosis present

## 2011-09-30 LAB — CK TOTAL AND CKMB (NOT AT ARMC): Relative Index: INVALID (ref 0.0–2.5)

## 2011-09-30 LAB — CBC WITH DIFFERENTIAL/PLATELET
Basophils Absolute: 0.1 10*3/uL (ref 0.0–0.1)
Basophils Relative: 1 % (ref 0–1)
Eosinophils Absolute: 0.2 10*3/uL (ref 0.0–0.7)
Eosinophils Absolute: 0.2 10*3/uL (ref 0.0–0.7)
Eosinophils Relative: 2 % (ref 0–5)
HCT: 30.3 % — ABNORMAL LOW (ref 36.0–46.0)
HCT: 29.1 % — ABNORMAL LOW (ref 36.0–46.0)
Hemoglobin: 10.2 g/dL — ABNORMAL LOW (ref 12.0–15.0)
Lymphocytes Relative: 43 % (ref 12–46)
Lymphs Abs: 4.3 10*3/uL — ABNORMAL HIGH (ref 0.7–4.0)
MCH: 30.8 pg (ref 26.0–34.0)
MCH: 31.1 pg (ref 26.0–34.0)
MCHC: 33.7 g/dL (ref 30.0–36.0)
MCV: 90.4 fL (ref 78.0–100.0)
Monocytes Absolute: 0.8 10*3/uL (ref 0.1–1.0)
Monocytes Absolute: 0.9 10*3/uL (ref 0.1–1.0)
Monocytes Relative: 9 % (ref 3–12)
RBC: 3.22 MIL/uL — ABNORMAL LOW (ref 3.87–5.11)
RDW: 13.2 % (ref 11.5–15.5)
RDW: 13.1 % (ref 11.5–15.5)
WBC: 10 10*3/uL (ref 4.0–10.5)

## 2011-09-30 LAB — POCT I-STAT, CHEM 8
BUN: 30 mg/dL — ABNORMAL HIGH (ref 6–23)
Chloride: 106 mEq/L (ref 96–112)
Potassium: 4.3 mEq/L (ref 3.5–5.1)
Sodium: 141 mEq/L (ref 135–145)
TCO2: 25 mmol/L (ref 0–100)

## 2011-09-30 LAB — COMPREHENSIVE METABOLIC PANEL
BUN: 22 mg/dL (ref 6–23)
CO2: 24 mEq/L (ref 19–32)
Calcium: 9.7 mg/dL (ref 8.4–10.5)
Chloride: 103 mEq/L (ref 96–112)
Creatinine, Ser: 0.75 mg/dL (ref 0.50–1.10)
GFR calc Af Amer: >90 mL/min (ref >90)
GFR calc non Af Amer: 78 mL/min — ABNORMAL LOW (ref >90)
Total Bilirubin: 0.3 mg/dL (ref 0.3–1.2)

## 2011-09-30 LAB — POCT I-STAT TROPONIN I: Troponin i, poc: 0.01 ng/mL (ref 0.00–0.08)

## 2011-09-30 LAB — MAGNESIUM: Magnesium: 1.7 mg/dL (ref 1.5–2.5)

## 2011-09-30 LAB — PROTIME-INR: INR: 1.02 (ref 0.00–1.49)

## 2011-09-30 MED ORDER — HEPARIN BOLUS VIA INFUSION
3000.0000 [IU] | Freq: Once | INTRAVENOUS | Status: AC
  Administered 2011-09-30: 3000 [IU] via INTRAVENOUS
  Filled 2011-09-30: qty 3000

## 2011-09-30 MED ORDER — NITROGLYCERIN 2 % TD OINT
0.5000 [in_us] | TOPICAL_OINTMENT | Freq: Four times a day (QID) | TRANSDERMAL | Status: DC
  Administered 2011-09-30 – 2011-10-01 (×2): 0.5 [in_us] via TOPICAL
  Filled 2011-09-30 (×3): qty 30
  Filled 2011-09-30: qty 1
  Filled 2011-09-30 (×2): qty 30

## 2011-09-30 MED ORDER — ONDANSETRON HCL 4 MG/2ML IJ SOLN: 4.0000 mg | Freq: Four times a day (QID) | INTRAMUSCULAR | Status: DC | PRN

## 2011-09-30 MED ORDER — ATORVASTATIN CALCIUM 20 MG PO TABS
20.0000 mg | ORAL_TABLET | Freq: Every day | ORAL | Status: DC
  Filled 2011-09-30: qty 1

## 2011-09-30 MED ORDER — PANTOPRAZOLE SODIUM 40 MG PO TBEC
40.0000 mg | DELAYED_RELEASE_TABLET | Freq: Every day | ORAL | Status: DC
  Administered 2011-10-01 – 2011-10-02 (×2): 40 mg via ORAL
  Filled 2011-09-30 (×2): qty 1

## 2011-09-30 MED ORDER — METOPROLOL TARTRATE 12.5 MG HALF TABLET
12.5000 mg | ORAL_TABLET | Freq: Two times a day (BID) | ORAL | Status: DC
  Administered 2011-10-01 – 2011-10-02 (×4): 12.5 mg via ORAL
  Filled 2011-09-30 (×5): qty 1

## 2011-09-30 MED ORDER — ACETAMINOPHEN 325 MG PO TABS: 650.0000 mg | ORAL_TABLET | ORAL | Status: DC | PRN

## 2011-09-30 MED ORDER — ACETAMINOPHEN-CODEINE #3 300-30 MG PO TABS
1.0000 | ORAL_TABLET | ORAL | Status: DC | PRN
  Administered 2011-10-01: 1 via ORAL
  Filled 2011-09-30: qty 1

## 2011-09-30 MED ORDER — ASPIRIN EC 81 MG PO TBEC
81.0000 mg | DELAYED_RELEASE_TABLET | Freq: Every day | ORAL | Status: DC
  Administered 2011-10-01 – 2011-10-02 (×2): 81 mg via ORAL
  Filled 2011-09-30 (×2): qty 1

## 2011-09-30 MED ORDER — HEPARIN (PORCINE) IN NACL 100-0.45 UNIT/ML-% IJ SOLN
900.0000 [IU]/h | INTRAMUSCULAR | Status: DC
  Administered 2011-09-30: 700 [IU]/h via INTRAVENOUS
  Filled 2011-09-30: qty 250

## 2011-09-30 MED ORDER — DIPHENHYDRAMINE HCL 25 MG PO CAPS
25.0000 mg | ORAL_CAPSULE | Freq: Every evening | ORAL | Status: DC | PRN
  Administered 2011-10-01: 25 mg via ORAL
  Filled 2011-09-30: qty 1

## 2011-09-30 MED ORDER — ASPIRIN EC 81 MG PO TBEC: 81.0000 mg | DELAYED_RELEASE_TABLET | ORAL | Status: DC

## 2011-09-30 MED ORDER — NITROGLYCERIN 0.4 MG SL SUBL: 0.4000 mg | SUBLINGUAL_TABLET | SUBLINGUAL | Status: DC | PRN

## 2011-09-30 NOTE — ED Provider Notes (Signed)
1:48 PM  Date: 09/30/2011  Rate:84  Rhythm: normal sinus rhythm  QRS Axis: left  Intervals: normal  ST/T Wave abnormalities: normal  Conduction Disutrbances:none  Narrative Interpretation: Normal EKG  Old EKG Reviewed: none available    Carleene Cooper III, MD 09/30/11 1349

## 2011-09-30 NOTE — Progress Notes (Signed)
ANTICOAGULATION CONSULT NOTE - Initial Consult  Pharmacy Consult for Heparin Indication: chest pain/ACS  Allergies  Allergen Reactions  . Demerol Other (See Comments)    coma  . Benzoin     coma  . Cephalexin     Turns red  . Cephalosporins     coma  . Contrast Media (Iodinated Diagnostic Agents)     Passed out  . Dilaudid (Hydromorphone Hcl)     coma  . Insulins Swelling  . Morphine And Related     coma  . Other     Patient highly sensitive to antibiotics, narcotics and any medication used to put patient to sleep for surgery.  . Penicillins     coma  . Sulfa Antibiotics     coma  . Aspirin     coma  . Latex Rash    Patient Measurements:   Heparin Dosing Weight: 58kg  Vital Signs: Temp: 98.2 F (36.8 C) (09/04 2041) Temp src: Oral (09/04 2041) BP: 131/58 mmHg (09/04 2041) Pulse Rate: 84  (09/04 2041)  Labs:  Basename 09/30/11 1432 09/30/11 1431  HGB 10.5* 10.2*  HCT 31.0* 30.3*  PLT -- 295  APTT -- --  LABPROT -- --  INR -- --  HEPARINUNFRC -- --  CREATININE 1.00 --  CKTOTAL -- --  CKMB -- --  TROPONINI -- --    The CrCl is unknown because both a height and weight (above a minimum accepted value) are required for this calculation.   Medical History: Past Medical History  Diagnosis Date  . Coronary artery disease     h/o NSTEMI in setting of AFib/sepsis (in [place]  LHC 04/06/11:  dLM 40%, oLAD 70%, then long 50%, then occluded, oDx 95%, p-mDx 60%, AVCFX occluded, oOM 90% with inf branch occluded, ext network of occluded OMs and PLs (fill s/w by collats), p+mRI 60%,, pRCA 40-50%, oAM 80%, EF 55-65%   . Atrial fibrillation     in setting of sepsis in [place]; tx with amiodarone (amio stopped 04/20/11);  Echo 02/26/11: Septal and apical hypokinesis, moderate LVE, EF 45-50%, posterior MAC with bileaflet mitral valve prolapse, posteriorly directed eccentric MR with moderate MR, mild BAE.  . Diverticulitis of colon (without mention of hemorrhage)   . Injury to  liver   . Allergic rhinitis due to pollen   . Hyperlipidemia   . Hypertension   . Bone/cartilage disorder   . Vitamin d deficiency   . Anemia, unspecified   . Arthritis   . Diabetes mellitus     boarderline  . Diverticulitis of colon with perforation s/p colectomy/ostomy 13Oct2012 02/23/2011  . Carotid stenosis     02/2011 - RICA 80-99% and LICA 60-79%  . NSTEMI (non-ST elevated myocardial infarction) 10/2010    Echo 10/2010 (at [facility] in [facility]):  mid ot apical septal AK, EF 40-45%, MAC, mild to mod MR, mild TR, no pulmo HTN    Medications:  See electronic Med Rec  Assessment: 81yof to start heparin for ACS/STEMI and h/o Afib. Patient reports no bleeding and not taking any anticoagulants. - Baseline INR ordered - Hg 10.5, Plts wnl - Heparin dosing weight: 58kg  Goal of Therapy:  Heparin level 0.3-0.7 units/ml Monitor platelets by anticoagulation protocol: Yes   Plan:  1. Heparin bolus 3000 units x 1 2. Heparin drip 700 units/hr (7 ml/hr) 3. Check heparin level 8 hours after heparin initiation 4. Daily heparin level and CBC   Cleon Dew [phone] 09/30/2011,9:18 PM

## 2011-09-30 NOTE — ED Notes (Addendum)
Pt reports chest pain radiating to left arm. Pt states she took 24 nitro tabs over past 16 days with no relief. Pt denies N/V, diaphoresis, dizziness. Pt reports chest pain worse at night and has been awaken from sleep. Pt states some relief of chest pain with nitro and tylenol.

## 2011-09-30 NOTE — ED Notes (Signed)
MD at bedside. 

## 2011-09-30 NOTE — ED Provider Notes (Signed)
History     CSN: [id number]  Arrival date & time 09/30/11  1303   First MD Initiated Contact with Patient 09/30/11 1359      Chief Complaint  Patient presents with  . Chest Pain    (Consider location/radiation/quality/duration/timing/severity/associated sxs/prior treatment) HPI Julia Greene is a 78 y.o. female with an extensive cardiac history, (atrial fibrillation, hyperlipidemia, hypertension, known coronary artery disease with history of end STEMI and coronary artery disease with collaterals)  carotid stenosis as well as diverticulitis status post colostomy presenting to the emergency department for chest pain.  Over the course of the last 2 weeks the patient has had recurrent chest pain recurs overnight. Most recently, the patient had an episode last night starting at 2 or 3 in the morning which is described as a heaviness in the center of chest, irradiated to left arm and to the right arm, not associated with diaphoresis, nausea or vomiting, patient took Tylenol with codeine this did not resolve the problem, she took one nitroglycerin this helped a little bit, she had persistent symptoms through the morning. She is currently having intermittent similar chest pains. The patient asserts going through 24 nitroglycerin tablets over the last 16 days. She discussed her condition with Dr. Sander Radon her cardiologist who referred her to come to the emergency department.  Patient says she was going to try chelation therapy for her vascular disease with Dr. Maricela Bo.  She also complains about intermittent pains in her legs that are worsened by movement. She also has chronic nonhealing ulcers on her feet. She does complain about a chronic dry cough, on lisinopril.  Past Medical History  Diagnosis Date  . Coronary artery disease     h/o NSTEMI in setting of AFib/sepsis (in [place]  LHC 04/06/11:  dLM 40%, oLAD 70%, then long 50%, then occluded, oDx 95%, p-mDx 60%, AVCFX occluded, oOM 90% with inf  branch occluded, ext network of occluded OMs and PLs (fill s/w by collats), p+mRI 60%,, pRCA 40-50%, oAM 80%, EF 55-65%   . Atrial fibrillation     in setting of sepsis in [place]; tx with amiodarone (amio stopped 04/20/11);  Echo 02/26/11: Septal and apical hypokinesis, moderate LVE, EF 45-50%, posterior MAC with bileaflet mitral valve prolapse, posteriorly directed eccentric MR with moderate MR, mild BAE.  . Diverticulitis of colon (without mention of hemorrhage)   . Injury to liver   . Allergic rhinitis due to pollen   . Hyperlipidemia   . Hypertension   . Bone/cartilage disorder   . Vitamin d deficiency   . Anemia, unspecified   . Arthritis   . Diabetes mellitus     boarderline  . Diverticulitis of colon with perforation s/p colectomy/ostomy 13Oct2012 02/23/2011  . Carotid stenosis     02/2011 - RICA 80-99% and LICA 60-79%  . NSTEMI (non-ST elevated myocardial infarction) 10/2010    Echo 10/2010 (at [facility] in [facility]  mid ot apical septal AK, EF 40-45%, MAC, mild to mod MR, mild TR, no pulmo HTN    Past Surgical History  Procedure Date  . Appendectomy   . Total hip arthroplasty 07/2004    Right  . Colostomy 10/2010  . Cesarean section 1955, 1959, 1960  . Abdominal hysterectomy 1976  . Bladder repair 1976  . Abdominal adhesion surgery     at time of hysterectomy  . Joint replacement     Right  Hip    Family History  Problem Relation Age of Onset  . Heart  disease Mother 52    Pacemaker, No CAD Heart Disease before age 74  . Diverticulitis Mother   . Diabetes Mother   . Hyperlipidemia Mother   . Hypertension Mother   . Heart attack Mother   . Lung disease Father 22    Smoking  . Ovarian cancer Daughter   . Cancer Daughter   . Colon cancer Neg Hx   . Heart disease Maternal Grandmother   . Diabetes Maternal Aunt     x 2  . Diabetes Sister   . Heart disease Sister     Heart Disease before age 45  . Hyperlipidemia Sister   . Hypertension Sister   . Heart attack  Sister   . Diabetes Sister     History  Substance Use Topics  . Smoking status: Never Smoker   . Smokeless tobacco: Never Used  . Alcohol Use: No    OB History    Grav Para Term Preterm Abortions TAB SAB Ect Mult Living                  Review of Systems positive for chest pain, chronic dry cough, lower extremity pain with walking, extensive allergies, chronic ulcers on the right foot; patient denies any fevers or chills, changes in vision, earache, sore throat, neck pain or stiffness, palpitations, syncope, dyspnea, wheezing, abdominal pain, nausea, vomiting, diarrhea, melena, red bloody stools, frequency, dysuria, myalgias, arthralgias, back pain, recent trauma, rash, itching, skin lesions, easy bruising or bleeding, headache, seizures, numbness, tingling or weakness.  Allergies  Demerol; Benzoin; Cephalexin; Cephalosporins; Contrast media; Dilaudid; Insulins; Morphine and related; Other; Penicillins; Sulfa antibiotics; Aspirin; and Latex  Home Medications   Current Outpatient Rx  Name Route Sig Dispense Refill  . ACETAMINOPHEN-CODEINE #3 300-30 MG PO TABS Oral Take 1 tablet by mouth every 4 (four) hours as needed. For pain    . ASPIRIN EC 81 MG PO TBEC Oral Take 1 tablet (81 mg total) by mouth every other day.    . ATORVASTATIN CALCIUM 20 MG PO TABS Oral Take 20 mg by mouth daily.    Marland Kitchen CALCIUM 600 + D PO Oral Take 1 tablet by mouth 2 (two) times daily.    Marland Kitchen BENADRYL PO Oral Take 30 mLs by mouth as needed. For cough    . LISINOPRIL 5 MG PO TABS Oral Take 5 mg by mouth daily.    Marland Kitchen MELATONIN 5 MG PO CAPS Oral Take 1 capsule by mouth at bedtime as needed. To help sleep    . MSM 1000 MG PO TABS Oral Take 1 tablet by mouth daily.    Marland Kitchen METOPROLOL TARTRATE 25 MG PO TABS Oral Take 12.5 mg by mouth 2 (two) times daily. Take 1/2 tablet by mouth twice daily    . ADULT GUMMY PO Oral Take 2 tablets by mouth daily.    Marland Kitchen NITROGLYCERIN 0.4 MG SL SUBL Sublingual Place 0.4 mg under the tongue  every 5 (five) minutes x 3 doses as needed. For chest pain    . OMEPRAZOLE 10 MG PO CPDR Oral Take 10 mg by mouth daily.      BP 147/82  Pulse 79  Temp 98.4 F (36.9 C) (Oral)  Resp 18  SpO2 99%  Physical Exam VITAL SIGNS:   Filed Vitals:   09/30/11 1312  BP: 147/82  Pulse: 79  Temp: 98.4 F (36.9 C)  Resp: 18   CONSTITUTIONAL: Awake, oriented, appears non-toxic HENT: Atraumatic, normocephalic, oral mucosa pink and moist, airway  patent. Nares patent without drainage. External ears normal. EYES: Conjunctiva clear, EOMI, PERRLA NECK: Trachea midline, non-tender, supple CARDIOVASCULAR: Normal heart rate, Normal rhythm, systolic murmur heard loudest at the left lower sternal border to the apex PULMONARY/CHEST: Clear to auscultation, no rhonchi, wheezes, or rales. Symmetrical breath sounds. Non-tender. ABDOMINAL: Non-distended, soft, non-tender - no rebound or guarding.  BS normal. NEUROLOGIC: Non-focal, moving all four extremities, no gross sensory or motor deficits. EXTREMITIES: No clubbing, cyanosis, or edema. Patient has a small pea-sized ulcer on the right toe the lateral aspect, a dime-sized ulcer with a pea-sized non[facility]aling [facility] on the aspect of her right arch and some mild skin breakdown the bilateral heads of first MTPs. SKIN: Warm, Dry, No erythema, No rash  ED Course  Procedures (including critical care time)  Labs Reviewed  CBC WITH DIFFERENTIAL - Abnormal; Notable for the following:    RBC 3.31 (*)     Hemoglobin 10.2 (*)     HCT 30.3 (*)     All other components within normal limits  POCT I-STAT, CHEM 8 - Abnormal; Notable for the following:    BUN 30 (*)     Glucose, Bld 110 (*)     Hemoglobin 10.5 (*)     HCT 31.0 (*)     All other components within normal limits  POCT I-STAT TROPONIN I   Dg Chest 2 View  09/30/2011  *RADIOLOGY REPORT*  Clinical Data: Chest pain, shortness of breath  CHEST - 2 VIEW  Comparison: None.  Findings: The lungs are clear and  slightly hyperaerated. Mediastinal contours appear normal.  The heart is within upper limits normal in size.  There is a slight thoracolumbar scoliosis present and the bones are somewhat osteopenic.  IMPRESSION: No active lung disease.  Slight hyperaeration.  Borderline cardiomegaly.   Original Report Authenticated By: Juline Patch, M.D.      1. Myocardial infarction   2. Unstable angina     MDM  NERA HAWORTH is a 78 y.o. female an extensive cardiac history, high-risk for cardiovascular events was referred to the emergency department by her cardiologist for increased usage of nitroglycerin. Think she is necessarily having a cardiac event at this time however increased use of nitrates over the last 2 weeks does speak to unstable angina. Initial EKG is without ST or T wave abnormalities suggestive of ischemia or infarction, and a ventricular rate of 84 beats per minute appears to be in normal sinus rhythm, normal axis normal intervals.  Laboratory investigation is largely unremarkable. Patient is mildly anemic with a hemoglobin of 10/2 and a slightly increased BUN of 30.  Pt presentation concerning for ACS - d/w [facility] for admission.           Jones Skene, MD 10/01/11 1343

## 2011-09-30 NOTE — Telephone Encounter (Signed)
Please return call to patient daugher N law camille  Patient having chest pains, and numbness in arm. (PT HAS TAKEN 24 NITROS IN THE LAST 16 DAYS)    WARM XFER TO HEATHER MCGHEE

## 2011-09-30 NOTE — ED Notes (Signed)
Pt was down to 1 ntg and has taken multiple in last several days, sts called cardiologist and sent here for furthur workup, pt with chest pain today, no relief with tylenol 3.

## 2011-09-30 NOTE — H&P (Signed)
CARDIOLOGY ADMISSION NOTE  Patient ID: Julia Greene MRN: [id number] DOB/AGE: Jun 16, 1930 78 y.o.  Admit date: 09/30/2011 Primary Physician   Oneal Grout, MD Primary Cardiologist   Dr Antoine Poche Chief Complaint    Chest pain.  HPI:  The patient has a history of CAD with cath as below in March.  At that time the decision was made for medical management of her longstanding severe diffuse disease with extensive calcification.  She also saw Dr. Darrick Penna for evaluation of her high grade carotid stenosis.  However, she opted for conservative therapy and wanted to consider chelation which she has had in the past.    The patients daughter in law called today because her mother in law has used 24 NTG SL in 16 days.  Interviewing the patient's day it's difficult to get a sense of how long her symptoms have been going on or whether they are new since the above cath. She says she's been having chest discomfort off and on he since before she moved to [place] several months ago. However it might be worse now. She describes getting chest discomfort at night. It wakes her from her sleep. It is 8/10 in intensity. It is similar to previous angina. Her left arm will get sore. If she takes a nitroglycerin she can prevent the right arm from getting sore. She's had take up to 2 nitroglycerin to get the pain to go away completely. She's not describing associated not see a vomiting or diaphoresis. She's not describing associated shortness of breath, PND or orthopnea. She's had no palpitations, presyncope or syncope. She does minimal light housework does not bring on the symptoms.   Past Medical History  Diagnosis Date  . Coronary artery disease     h/o NSTEMI in setting of [place]ib/[place] ([place]  LHC 04/06/11:  dLM 40%, oLAD 70%, then long 50%, then occluded, oDx 95%, p-mDx 60%, AVCFX occluded, oOM 90% with inf branch occluded, ext network of occluded OMs and PLs (fill s/w by collats), p+mRI 60%,, pRCA 40-50%, oAM  80%, EF 55-65%   . Atrial fibrillation     in setting of sepsis in [place]; tx with amiodarone (amio stopped 04/20/11);  Echo 02/26/11: Septal and apical hypokinesis, moderate LVE, EF 45-50%, posterior MAC with bileaflet mitral valve prolapse, posteriorly directed eccentric MR with moderate MR, mild BAE.  . Diverticulitis of colon (without mention of hemorrhage)   . Injury to liver   . Allergic rhinitis due to pollen   . Hyperlipidemia   . Hypertension   . Bone/cartilage disorder   . Vitamin d deficiency   . Anemia, unspecified   . Arthritis   . Diabetes mellitus     boarderline  . Diverticulitis of colon with perforation s/p colectomy/ostomy 13Oct2012 02/23/2011  . Carotid stenosis     02/2011 - RICA 80-99% and LICA 60-79%  . NSTEMI (non-ST elevated myocardial infarction) 10/2010    Echo 10/2010 (at [facility] in [facility]):  mid ot apical septal AK, EF 40-45%, MAC, mild to mod MR, mild TR, no pulmo HTN    Past Surgical History  Procedure Date  . Appendectomy   . Total hip arthroplasty 07/2004    Right  . Colostomy 10/2010  . Cesarean section 1955, 1959, 1960  . Abdominal hysterectomy 1976  . Bladder repair 1976  . Abdominal adhesion surgery     at time of hysterectomy  . Joint replacement     Right  Hip    Allergies  Allergen  Reactions  . Demerol Other (See Comments)    coma  . Benzoin     coma  . Cephalexin     Turns red  . Cephalosporins     coma  . Contrast Media (Iodinated Diagnostic Agents)     Passed out  . Dilaudid (Hydromorphone Hcl)     coma  . Insulins Swelling  . Morphine And Related     coma  . Other     Patient highly sensitive to antibiotics, narcotics and any medication used to put patient to sleep for surgery.  . Penicillins     coma  . Sulfa Antibiotics     coma  . Aspirin     coma  . Latex Rash    Current Outpatient Prescriptions on File Prior to Encounter  Medication Sig Dispense Refill  . aspirin EC 81 MG tablet Take 1 tablet (81 mg total)  by mouth every other day.      Marland Kitchen atorvastatin (LIPITOR) 20 MG tablet Take 20 mg by mouth daily.      . Calcium Carbonate-Vitamin D (CALCIUM 600 + D PO) Take 1 tablet by mouth 2 (two) times daily.      . DiphenhydrAMINE HCl (BENADRYL PO) Take 30 mLs by mouth as needed. For cough      . metoprolol tartrate (LOPRESSOR) 25 MG tablet Take 12.5 mg by mouth 2 (two) times daily. Take 1/2 tablet by mouth twice daily      . omeprazole (PRILOSEC) 10 MG capsule Take 10 mg by mouth daily.      Marland Kitchen lisinopril Take 5 mg by mouth daily.       History   Social History  . Marital Status: Widowed    Spouse Name: N/A    Number of Children: N/A  . Years of Education: N/A   Occupational History  . Not on file.   Social History Main Topics  . Smoking status: Never Smoker   . Smokeless tobacco: Never Used  . Alcohol Use: No  . Drug Use: No  . Sexually Active: Not on file   Other Topics Concern  . Not on file   Social History Narrative  . No narrative on file    Family History  Problem Relation Age of Onset  . Heart disease Mother 91    Pacemaker, No CAD Heart Disease before age 61  . Diverticulitis Mother   . Diabetes Mother   . Hyperlipidemia Mother   . Hypertension Mother   . Heart attack Mother   . Lung disease Father 67    Smoking  . Ovarian cancer Daughter   . Cancer Daughter   . Colon cancer Neg Hx   . Heart disease Maternal Grandmother   . Diabetes Maternal Aunt     x 2  . Diabetes Sister   . Heart disease Sister     Heart Disease before age 68  . Hyperlipidemia Sister   . Hypertension Sister   . Heart attack Sister   . Diabetes Sister     ROS:  Coughing, sores on the feet.  As stated in the HPI and negative for all other systems.  Physical Exam: Blood pressure 142/60, pulse 74, temperature 98.7 F (37.1 C), temperature source Oral, resp. rate 14, SpO2 99.00%.  GENERAL: No distress, frail HEENT:  PERRL, edentulous NECK: No jugular venous distention, waveform within normal  limits, carotid upstroke brisk and symmetric, bilateral bruits, no thyromegaly  LYMPHATICS: No cervical, inguinal adenopathy  LUNGS: Clear to  auscultation bilaterally  BACK: No CVA tenderness  CHEST: Unremarkable  HEART: PMI not displaced or sustained,S1 and S2 within normal limits, no S3, no S4, no clicks, no rubs, apical holosystolic murmur 3/6 with radiation to the axilla and mildly out the outflow tract.  ABD: Flat, positive bowel sounds normal in frequency in pitch, no bruits, no rebound, no guarding, no midline pulsatile mass, no hepatomegaly, no splenomegaly, colostomy left lower quadrent  EXT: 2 plus pulses upper and decreased DP/PT lower, no edema, no cyanosis no clubbing, healing ulcer instep of the right foot SKIN: No rashes no nodules  NEURO: Cranial nerves II through XII grossly intact, motor grossly intact throughout  PSYCH: Cognitively intact, oriented to person place and time   Labs: Lab Results  Component Value Date   BUN 30* 09/30/2011   Lab Results  Component Value Date   CREATININE 1.00 09/30/2011   Lab Results  Component Value Date   NA 141 09/30/2011   K 4.3 09/30/2011   CL 106 09/30/2011   CO2 23 03/27/2011   No results found for this basename: CKTOTAL,  CKMB,  CKMBINDEX,  TROPONINI   Lab Results  Component Value Date   WBC 8.9 09/30/2011   HGB 10.5* 09/30/2011   HCT 31.0* 09/30/2011   MCV 91.5 09/30/2011   PLT 295 09/30/2011     Radiology:   CXR:  No active lung disease. Slight hyperaeration. Borderline  cardiomegaly.  EKG:  NSR, axis WNL, intervals WNL, no acute ST T wave changes.  ASSESSMENT AND PLAN:    Unstable angina - I will admit the patient and cycle cardiac enzymes. I will need to review her cath films to see if there is any intervention that we can do that might obvious benefit. We will also continue with med titration.  Carotid artery stenosis, asymptomatic -  Although she initially opted for chelation therapy. She would now consider surgery. She will  schedule followup with Dr. Darrick Penna.  Atrial fibrillation -  This happened after her long hospitalization in [place] which is described elsewhere. I took her off the amiodarone when I saw her in followup in she's had no symptomatic recurrence of this.  Foot wound - She does have an ulcer on her foot seems to be healing but still uncomfortable. She may have had trauma. She does have reduced pulses. This can also be followed by Dr. Darrick Penna when he sees her about her carotids.  Cough -  She has a dry nonproductive cough and I will stop her ACE inhibitor.   SignedRollene Rotunda 09/30/2011, 6:44 PM

## 2011-09-30 NOTE — Telephone Encounter (Signed)
I spoke with the patient's daughter in law, [place]. She states that she refilled the patient's NTG on 8/19 and now she is down to 1 pill in her bottle. She states they did not realize until this morning that the patient has been having chest pain, mostly during the night, and she does not wake them. She has been requiring 2-3 NTG at a time. This morning she had chest pain about 4 am. She took a tylenol #3 which did not help. She took one NTG and was finally able to go back to sleep after a while. She does have a history of an MI in October 2012 after surgery for a ruptured colon. I advised the patient's daughter in law that if the patient has required that much NTG in that short amount of time, she needs to be taken to the ER for evaluation. The patient's daughter in law was hesitant to do this without Dr. Jenene Slicker ok. I spoke with Pam and she stated Dr. Antoine Poche would want her evaluated in the ER. I have advised that if the patient is pain free, she can be transported by private vehicle, but if she is having pain, she should be transported via EMS. Camille voices understanding. Trish (cardmaster) made aware.

## 2011-10-01 LAB — CBC
HCT: 27.3 % — ABNORMAL LOW (ref 36.0–46.0)
Hemoglobin: 9.1 g/dL — ABNORMAL LOW (ref 12.0–15.0)
MCH: 30.4 pg (ref 26.0–34.0)
RBC: 2.99 MIL/uL — ABNORMAL LOW (ref 3.87–5.11)

## 2011-10-01 LAB — CK TOTAL AND CKMB (NOT AT ARMC)
CK, MB: 2.1 ng/mL (ref 0.3–4.0)
Total CK: 37 U/L (ref 7–177)
Total CK: 43 U/L (ref 7–177)

## 2011-10-01 LAB — T4, FREE: Free T4: 0.77 ng/dL — ABNORMAL LOW (ref 0.80–1.80)

## 2011-10-01 LAB — BASIC METABOLIC PANEL
BUN: 20 mg/dL (ref 6–23)
CO2: 24 mEq/L (ref 19–32)
Chloride: 105 mEq/L (ref 96–112)
GFR calc Af Amer: >90 mL/min (ref >90)
Glucose, Bld: 116 mg/dL — ABNORMAL HIGH (ref 70–99)
Potassium: 3.8 mEq/L (ref 3.5–5.1)

## 2011-10-01 LAB — OCCULT BLOOD X 1 CARD TO LAB, STOOL: Fecal Occult Bld: NEGATIVE

## 2011-10-01 LAB — LIPID PANEL
Cholesterol: 226 mg/dL — ABNORMAL HIGH (ref 0–200)
VLDL: 35 mg/dL (ref 0–40)

## 2011-10-01 LAB — TROPONIN I: Troponin I: <0.3 ng/mL (ref <0.30)

## 2011-10-01 MED ORDER — HEPARIN BOLUS VIA INFUSION
1500.0000 [IU] | Freq: Once | INTRAVENOUS | Status: AC
  Administered 2011-10-01: 1500 [IU] via INTRAVENOUS
  Filled 2011-10-01: qty 1500

## 2011-10-01 MED ORDER — ATORVASTATIN CALCIUM 40 MG PO TABS
40.0000 mg | ORAL_TABLET | Freq: Every day | ORAL | Status: DC
  Administered 2011-10-01: 40 mg via ORAL
  Filled 2011-10-01 (×2): qty 1

## 2011-10-01 MED ORDER — COLLAGENASE 250 UNIT/GM EX OINT
TOPICAL_OINTMENT | Freq: Every day | CUTANEOUS | Status: DC
  Administered 2011-10-01 – 2011-10-02 (×2): via TOPICAL
  Filled 2011-10-01: qty 30

## 2011-10-01 MED ORDER — ISOSORBIDE MONONITRATE ER 60 MG PO TB24
60.0000 mg | ORAL_TABLET | Freq: Every day | ORAL | Status: DC
  Administered 2011-10-01 – 2011-10-02 (×2): 60 mg via ORAL
  Filled 2011-10-01 (×3): qty 1

## 2011-10-01 NOTE — Progress Notes (Signed)
ANTICOAGULATION CONSULT NOTE - Follow Up Consult  Pharmacy Consult for Heparin Indication: chest pain/ACS  Allergies  Allergen Reactions  . Demerol Other (See Comments)    coma  . Benzoin     coma  . Cephalexin     Turns red  . Cephalosporins     coma  . Contrast Media (Iodinated Diagnostic Agents)     Passed out  . Dilaudid (Hydromorphone Hcl)     coma  . Insulins Swelling  . Morphine And Related     coma  . Other     Patient highly sensitive to antibiotics, narcotics and any medication used to put patient to sleep for surgery.  . Penicillins     coma  . Sulfa Antibiotics     coma  . Aspirin     coma  . Latex Rash    Patient Measurements: Height: 5\' 3"  (160 cm) Weight: 128 lb 9.6 oz (58.333 kg) IBW/kg (Calculated) : 52.4  Heparin Dosing Weight: 58kg  Vital Signs: Temp: 98.1 F (36.7 C) (09/05 0444) Temp src: Oral (09/05 0444) BP: 119/46 mmHg (09/05 0444) Pulse Rate: 68  (09/05 0444)  Labs:  Basename 10/01/11 0545 10/01/11 0300 09/30/11 2156 09/30/11 2155 09/30/11 2149 09/30/11 1432 09/30/11 1431  HGB 9.1* -- -- -- 10.0* -- --  HCT 27.3* -- -- -- 29.1* 31.0* --  PLT 249 -- -- -- 271 -- 295  APTT -- -- -- -- -- -- --  LABPROT -- -- -- 13.6 -- -- --  INR -- -- -- 1.02 -- -- --  HEPARINUNFRC <0.10* -- -- -- -- -- --  CREATININE 0.77 -- -- -- 0.75 1.00 --  CKTOTAL -- 37 40 -- -- -- --  CKMB -- 2.1 2.3 -- -- -- --  TROPONINI -- <0.30 <0.30 -- -- -- --    Estimated Creatinine Clearance: 47.2 ml/min (by C-G formula based on Cr of 0.77).   Medical History: Past Medical History  Diagnosis Date  . Coronary artery disease     h/o NSTEMI in setting of AFib/se[place] ([place]  LHC 04/06/11:  dLM 40%, oLAD 70%, then long 50%, then occluded, oDx 95%, p-mDx 60%, AVCFX occluded, oOM 90% with inf branch occluded, ext network of occluded OMs and PLs (fill s/w by collats), p+mRI 60%,, pRCA 40-50%, oAM 80%, EF 55-65%   . Atrial fibrillation     in setting of sepsis in  [place]; tx with amiodarone (amio stopped 04/20/11);  Echo 02/26/11: Septal and apical hypokinesis, moderate LVE, EF 45-50%, posterior MAC with bileaflet mitral valve prolapse, posteriorly directed eccentric MR with moderate MR, mild BAE.  . Diverticulitis of colon (without mention of hemorrhage)   . Injury to liver   . Allergic rhinitis due to pollen   . Hyperlipidemia   . Hypertension   . Bone/cartilage disorder   . Vitamin d deficiency   . Anemia, unspecified   . Arthritis   . Diabetes mellitus     boarderline  . Diverticulitis of colon with perforation s/p colectomy/ostomy 13Oct2012 02/23/2011  . Carotid stenosis     02/2011 - RICA 80-99% and LICA 60-79%  . NSTEMI (non-ST elevated myocardial infarction) 10/2010    Echo 10/2010 (at [facility] in [facility]):  mid ot apical septal AK, EF 40-45%, MAC, mild to mod MR, mild TR, no pulmo HTN    Medications:  See electronic Med Rec  Assessment: 81yof to start heparin for ACS/STEMI and h/o Afib. Heparin level (<0.1) is below-goal on 700  units/hr.   Goal of Therapy:  Heparin level 0.3-0.7 units/ml Monitor platelets by anticoagulation protocol: Yes   Plan:  1. Heparin IV bolus of 1500 units x 1, then increase IV infusion to 900 units/hr.  2. Heparin level in 8 hours.   Emeline Gins 10/01/2011,7:18 AM

## 2011-10-01 NOTE — Care Management Note (Unsigned)
    Page 1 of 1   10/01/2011     11:11:29 AM   CARE MANAGEMENT NOTE 10/01/2011  Patient:  Julia Greene, Julia Greene   Account Number:  [id number]  Date Initiated:  10/01/2011  Documentation initiated by:  SIMMONS,Gerod Caligiuri  Subjective/Objective Assessment:   ADMITTED WITH [place]; LIVES AT HOME WITH SON AND DAUGHTER IN LAW; USES [facility] FOR AMBULATION; USES [facility] FOR RX.     Action/Plan:   DISCHARGE PLANNING DISCUSSED AT BEDSIDE.   Anticipated DC Date:  10/02/2011   Anticipated DC Plan:  HOME/SELF CARE      DC Planning Services  CM consult      Choice offered to / List presented to:             Status of service:  In process, will continue to follow Medicare Important Message given?   (If response is "NO", the following Medicare IM given date fields will be blank) Date Medicare IM given:   Date Additional Medicare IM given:    Discharge Disposition:    Per UR Regulation:  Reviewed for med. necessity/level of care/duration of stay  If discussed at Long Length of Stay Meetings, dates discussed:    Comments:  10/01/11  1110  Yessika Otte SIMMONS RN, BSN [phone] NCM WILL FOLLOW.

## 2011-10-01 NOTE — Progress Notes (Signed)
Cardiology Progress Note Patient Name: Julia Greene Date of Encounter: 10/01/2011, 8:59 AM     Subjective  No overnight events. Patient denies chest pain or sob this morning.    Objective   Telemetry: sinus rhythm 60-80s, occ PVCs  Medications: . aspirin EC  81 mg Oral Daily  . atorvastatin  20 mg Oral QPC supper  . heparin  1,500 Units Intravenous Once  . heparin  3,000 Units Intravenous Once  . metoprolol tartrate  12.5 mg Oral BID  . nitroGLYCERIN  0.5 inch Topical Q6H  . pantoprazole  40 mg Oral Q1200   . heparin 900 Units/hr (10/01/11 0757)    Physical Exam: Temp:  [98.1 F (36.7 C)-98.9 F (37.2 C)] 98.1 F (36.7 C) (09/05 0444) Pulse Rate:  [68-90] 68  (09/05 0444) Resp:  [14-22] 20  (09/05 0444) BP: (119-147)/(46-82) 119/46 mmHg (09/05 0444) SpO2:  [97 %-99 %] 98 % (09/05 0444) Weight:  [128 lb 9.6 oz (58.333 kg)] 128 lb 9.6 oz (58.333 kg) (09/04 2137)  General: Pleasant elderly female, in no acute distress. Head: Normocephalic, atraumatic, sclera non-icteric, nares are without discharge.  Neck: Supple. (+) bilat carotid bruits. No JVD. Lungs: Clear bilaterally to auscultation without wheezes, rales, or rhonchi. Breathing is unlabored. Heart: RRR S1 S2 without murmurs, rubs, or gallops.  Abdomen: Colostomy bag intact, stoma pink, no obvious blood. Soft, non-tender, non-distended with normoactive bowel sounds. No rebound/guarding. No obvious abdominal masses. Msk:  Strength and tone appear normal for age. Extremities: Healing wounds x3 to right foot. No edema. No clubbing or cyanosis. Distal pedal pulses are intact and equal bilaterally. Neuro: Alert and oriented X 3. Moves all extremities spontaneously. Psych:  Responds to questions appropriately with a normal affect.   Labs:  [facility] 10/01/11 0545 09/30/11 2149  NA 139 138  K 3.8 3.7  CL 105 103  CO2 24 24  GLUCOSE 116* 110*  BUN 20 22  CREATININE 0.77 0.75  CALCIUM 9.0 9.7  MG -- 1.7    Basename 09/30/11 2149  AST 15  ALT 8  ALKPHOS 59  BILITOT 0.3  PROT 7.1  ALBUMIN 3.5   Basename 10/01/11 0545 09/30/11 2149 09/30/11 1431  WBC 9.0 10.0 --  NEUTROABS -- 4.5 4.2  HGB 9.1* 10.0* --  HCT 27.3* 29.1* --  MCV 91.3 90.4 --  PLT 249 271 --   Basename 10/01/11 0300 09/30/11 2156  CKTOTAL 37 40  CKMB 2.1 2.3  TROPONINI <0.30 <0.30   Basename 10/01/11 0304  CHOL 226*  HDL 37*  LDLCALC 154*  TRIG 176*  CHOLHDL 6.1   Basename 09/30/11 2149  TSH 12.011*    Radiology/Studies:   09/30/2011 -  Chest 2 View  Findings: The lungs are clear and slightly hyperaerated. Mediastinal contours appear normal.  The heart is within upper limits normal in size.  There is a slight thoracolumbar scoliosis present and the bones are somewhat osteopenic.  IMPRESSION: No active lung disease.  Slight hyperaeration.  Borderline cardiomegaly.      Assessment and Plan   1. Unstable Angina: Patient presented with chest pain relieved with NTG over the last couple of months concerning for unstable angina. Troponin normal x2. EKG without acute ST/T changes. Dr. Antoine Poche to review her old cath films to assess if any intervention would be of benefit. No chest pain since arrival to the floor with NTG paste. Cont ASA, BB, statin, nitro paste. Consider addition of Imdur.   2.  Coronary Artery Disease: LHC 03/2011 severe diffuse disease with extensive calcification treated medically. Plans as above.  3. Elevated TSH: TSH 12.011. H/o hypothyroid ~83yrs ago per the patient (was on thyroid med, stopped ~79yrs ago). Check free T4/T3.  4. Cough:  Lisinopril stopped yesterday due to dry cough. Cough improved today.  EF 45% to 50% by echo 01/2011. Consider adding ARB.  5. Foot wound: Healing foot wound to be followed by Dr. Darrick Penna as an outpatient  6. High Grade Carotid artery stenosis - Although she initially opted for chelation therapy. She would now consider surgery. She will schedule followup with Dr.  Darrick Penna  7. H/o A.fib in the setting of sepsis - amio stopped 04/20/11; No symptomatic or documented recurrence of atrial fibrillation since discontinuation of amiodarone. Sinus on tele.  8. Hyperlipidemia: LDL 154, goal <70. LFTs ok. Consider increasing Lipitor for better control.    9. Hypertension: Monitor BP with discontinuation of Lisinopril. May need to increase BB or add another agent (?ARB).  10. Anemia: Hgb 10.5 --> 9.1. No active signs of bleeding. Check occult stool.  Signed, HOPE, JESSICA PA-C . History and all data above reviewed.  Patient examined.  I agree with the findings as above.  No chest pain last night.  Ruled out for MI thus far.  The patient exam reveals COR:RRR  ,  Lungs: Clear  ,  Abd: Positive bowel sounds, no rebound no guarding, Ext No edema  .  All available labs, radiology testing, previous records reviewed. Agree with documented assessment and plan. I reviewed the cath films.  There is not an obvious percutaneous treatment for her severe CAD.  She could be considered for high risk CABG.  However, she is not sure she would want this.  I will continue to pursue medical management.  I will stop the heparin and add Imdur.  I will ask for a wound care consult to look at her feet.  I will arrange follow up as an outpatient with Dr. Darrick Penna.    Fayrene Fearing Bre P[place]  11:41 AM  10/01/2011

## 2011-10-01 NOTE — Consult Note (Signed)
WOC consult Note Reason for Consult: Consult requested for right foot chronic wounds.   Wound type:2 areas of full thickness wounds with moist eschar. Pressure Ulcer POA: These are not pressure ulcers.   Measurement:Right outer foot near edge of small toe .3X.3cm.   Inner right foot .5X.5cm Wound bed: Both sites moist brown eschar Drainage (amount, consistency, odor) No odor or drainage Periwound:Intact skin surrounding Dressing procedure/placement/frequency: Santyl ointment to chemically debride non-viable tissue.  Discussed proper use of ointment at home after discharge.  Pt states she is a retired Engineer, civil (consulting) and understands topical treatment and when to discontinue usage. Will not plan to follow further unless re-consulted.  [address] RN, MSN, [employer]  [phone]

## 2011-10-02 ENCOUNTER — Encounter (HOSPITAL_COMMUNITY): Payer: Self-pay | Admitting: Nurse Practitioner

## 2011-10-02 DIAGNOSIS — I2510 Atherosclerotic heart disease of native coronary artery without angina pectoris: Secondary | ICD-10-CM | POA: Diagnosis present

## 2011-10-02 DIAGNOSIS — L97519 Non-pressure chronic ulcer of other part of right foot with unspecified severity: Secondary | ICD-10-CM

## 2011-10-02 DIAGNOSIS — E785 Hyperlipidemia, unspecified: Secondary | ICD-10-CM | POA: Diagnosis present

## 2011-10-02 LAB — CBC
HCT: 27.3 % — ABNORMAL LOW (ref 36.0–46.0)
MCHC: 33 g/dL (ref 30.0–36.0)
Platelets: 246 10*3/uL (ref 150–400)
RDW: 13.3 % (ref 11.5–15.5)
WBC: 7.9 10*3/uL (ref 4.0–10.5)

## 2011-10-02 MED ORDER — NITROGLYCERIN 0.4 MG SL SUBL: 0.4000 mg | SUBLINGUAL_TABLET | SUBLINGUAL | Status: DC | PRN

## 2011-10-02 MED ORDER — COLLAGENASE 250 UNIT/GM EX OINT: TOPICAL_OINTMENT | Freq: Every day | CUTANEOUS | Status: AC

## 2011-10-02 MED ORDER — ISOSORBIDE MONONITRATE ER 60 MG PO TB24: 60.0000 mg | ORAL_TABLET | Freq: Every day | ORAL | Status: DC

## 2011-10-02 MED ORDER — ATORVASTATIN CALCIUM 40 MG PO TABS: 40.0000 mg | ORAL_TABLET | Freq: Every day | ORAL | Status: DC

## 2011-10-02 NOTE — Progress Notes (Signed)
Pt d/c home with instructions, r/x, and f/u appt.  Pt and dtr-in-law verbalized understanding of instructions, pt home with son, escorted out by volunteer.

## 2011-10-02 NOTE — Discharge Summary (Signed)
Patient ID: Julia Greene,  MRN: [id number], DOB/AGE: 02-12-1930 78 y.o.  Admit date: 09/30/2011 Discharge date: 10/02/2011  Primary Care Provider: Oneal Grout Primary Cardiologist: J. Uel Davidow, MD  Discharge Diagnoses Principal Problem:  *Unstable angina  **Films reviewed and nitrate added this admission. Active Problems:  Coronary artery disease  **Severe multivessel dzs->high risk surgical candidate (prefers to avoid)->Med Rx.  HTN (hypertension)  Occlusion and stenosis of carotid artery without mention of cerebral infarction  **F/U with vascular surgery next week.  Hyperlipidemia  Foot ulcer, right  **Seen by wound mgmt this admission.  Allergies Allergies  Allergen Reactions  . Demerol Other (See Comments)    coma  . Benzoin     coma  . Cephalexin     Turns red  . Cephalosporins     coma  . Contrast Media (Iodinated Diagnostic Agents)     Passed out  . Dilaudid (Hydromorphone Hcl)     coma  . Insulins Swelling  . Morphine And Related     coma  . Other     Patient highly sensitive to antibiotics, narcotics and any medication used to put patient to sleep for surgery.  . Penicillins     coma  . Sulfa Antibiotics     coma  . Aspirin     coma  . Latex Rash   Procedures  None  History of Present Illness  78 y/o female with h/o severe CAD and carotid dzs who historically has preferred conservative mgmt.  She was seen in cardiology clinic on 9/4 with complaints of increasing frequency of angina and nitrate usage.  She was admitted for further evaluation.  Hospital Course  Pt r/o for MI.  Previous cath films were reviewed and there were not felt to be any obvious targets for PCI.  Pt is felt to be a high-risk surgical candidate and at this point she prefers to avoid surgery.  She was placed on long-acting nitrate therapy and has not had any further chest pain.  Pt also has known carotid dzs.  Though she previously preferred a conservative approach to managing  this, she is now willing to consider surgery.  We have arranged for f/u with vascular surgery next week.  TSH was found to be elevated @ 12.011.  Free T4 was slightly diminished @ 0.77 and FT3 was normal.  We will defer additional management to her PCP on an outpt basis.  Finally, pt has complained of cough.  We have discontinued her ACEI.  Her BP has remained stable without a replacement agent.  Discharge Vitals Blood pressure 115/64, pulse 77, temperature 98.1 F (36.7 C), temperature source Oral, resp. rate 18, height 5\' 3"  (1.6 m), weight 128 lb 9.6 oz (58.333 kg), SpO2 99.00%.  Filed Weights   09/30/11 2137  Weight: 128 lb 9.6 oz (58.333 kg)   Labs  CBC  Basename 10/02/11 0625 10/01/11 0545 09/30/11 2149 09/30/11 1431  WBC 7.9 9.0 -- --  NEUTROABS -- -- 4.5 4.2  HGB 9.0* 9.1* -- --  HCT 27.3* 27.3* -- --  MCV 92.5 91.3 -- --  PLT 246 249 -- --   Basic Metabolic Panel  Basename 10/01/11 0545 09/30/11 2149  NA 139 138  K 3.8 3.7  CL 105 103  CO2 24 24  GLUCOSE 116* 110*  BUN 20 22  CREATININE 0.77 0.75  CALCIUM 9.0 9.7  MG -- 1.7  PHOS -- --   Liver Function Tests  Basename 09/30/11 2149  AST 15  ALT 8  ALKPHOS 59  BILITOT 0.3  PROT 7.1  ALBUMIN 3.5   Cardiac Enzymes  Basename 10/01/11 1158 10/01/11 0300 09/30/11 2156  CKTOTAL 43 37 40  CKMB 2.2 2.1 2.3  CKMBINDEX -- -- --  TROPONINI <0.30 <0.30 <0.30   Fasting Lipid Panel  Basename 10/01/11 0304  CHOL 226*  HDL 37*  LDLCALC 154*  TRIG 176*  CHOLHDL 6.1  LDLDIRECT --   Thyroid Function Tests  Basename 10/01/11 1158 09/30/11 2149  TSH -- 12.011*  T4TOTAL -- --  T3FREE 2.4 --  THYROIDAB -- --  FT4 0.77  Disposition  Pt is being discharged home today in good condition.  Follow-up Plans & Appointments  Follow-up Information    Follow up with Rollene Rotunda, MD on 10/06/2011. (1:45 PM)    Contact information:   [address] [place]  [place] [address] [phone]       Follow up with Sherren Kerns, MD on 10/08/2011. (10a Carotid U/S, 11a Appt with Dr. Darrick Penna)    Contact information:   [address] [place] [address] [phone]       Follow up with Oneal Grout, MD. (1-2 wks)    Contact information:   [address] [facility] [place] [address] [phone]         Discharge Medications  Medication List  As of 10/02/2011 10:59 AM   STOP taking these medications         lisinopril 5 MG tablet         TAKE these medications         acetaminophen-codeine 300-30 MG per tablet   Commonly known as: TYLENOL #3   Take 1 tablet by mouth every 4 (four) hours as needed. For pain      ADULT GUMMY PO   Take 2 tablets by mouth daily.      aspirin EC 81 MG tablet   Take 1 tablet (81 mg total) by mouth every other day.      atorvastatin 40 MG tablet   Commonly known as: LIPITOR   Take 1 tablet (40 mg total) by mouth daily.      BENADRYL PO   Take 30 mLs by mouth as needed. For cough      CALCIUM 600 + D PO   Take 1 tablet by mouth 2 (two) times daily.      collagenase ointment   Commonly known as: SANTYL   Apply topically daily. Apply to inner and outer right foot wound daily.  Cover with moist dressgin.      isosorbide mononitrate 60 MG 24 hr tablet   Commonly known as: IMDUR   Take 1 tablet (60 mg total) by mouth daily.      Melatonin 5 MG Caps   Take 1 capsule by mouth at bedtime as needed. To help sleep      metoprolol tartrate 25 MG tablet   Commonly known as: LOPRESSOR   Take 12.5 mg by mouth 2 (two) times daily. Take 1/2 tablet by mouth twice daily      MSM 1000 MG Tabs   Take 1 tablet by mouth daily.      nitroGLYCERIN 0.4 MG SL tablet   Commonly known as: NITROSTAT   Place 1 tablet (0.4 mg total) under the tongue every 5 (five) minutes x 3 doses as needed. For chest pain      omeprazole 10 MG capsule  Commonly known as: PRILOSEC   Take 10  mg by mouth daily.           Outstanding Labs/Studies  Carotid U/S scheduled for 9/12.  Duration of Discharge Encounter   Greater than 30 minutes including physician time.  Signed, Nicolasa Ducking NP 10/02/2011, 10:59 AM   Patient seen and examined.  Plan as discussed in my rounding note for today and outlined above. Fayrene Fearing Talyssa Gibas  10/02/2011  11:30 AM

## 2011-10-02 NOTE — Progress Notes (Signed)
   SUBJECTIVE:  No chest pain.  She slept well last night.     PHYSICAL EXAM Filed Vitals:   10/01/11 1057 10/01/11 1435 10/01/11 2038 10/02/11 0505  BP: 122/69 105/63 105/49 107/60  Pulse: 75 78 66 63  Temp:  97.7 F (36.5 C) 98.3 F (36.8 C) 98.1 F (36.7 C)  TempSrc:  Oral Oral Oral  Resp:  20 19 18   Height:      Weight:      SpO2:  98% 95% 99%   General:  No distress Lungs:  Clear Heart:  RRR Abdomen:  Positive bowel sounds, no rebound no guarding Extremities:  No edema  LABS: Lab Results  Component Value Date   CKTOTAL 43 10/01/2011   CKMB 2.2 10/01/2011   TROPONINI <0.30 10/01/2011   Results for orders placed during the hospital encounter of 09/30/11 (from the past 24 hour(s))  CK TOTAL AND CKMB     Status: Normal   Collection Time   10/01/11 11:58 AM      Component Value Range   Total CK 43  7 - 177 U/L   CK, MB 2.2  0.3 - 4.0 ng/mL   Relative Index RELATIVE INDEX IS INVALID  0.0 - 2.5  TROPONIN I     Status: Normal   Collection Time   10/01/11 11:58 AM      Component Value Range   Troponin I <0.30  <0.30 ng/mL  T4, FREE     Status: Abnormal   Collection Time   10/01/11 11:58 AM      Component Value Range   Free T4 0.77 (*) 0.80 - 1.80 ng/dL  T3, FREE     Status: Normal   Collection Time   10/01/11 11:58 AM      Component Value Range   T3, Free 2.4  2.3 - 4.2 pg/mL  OCCULT BLOOD X 1 CARD TO LAB, STOOL     Status: Normal   Collection Time   10/01/11  2:23 PM      Component Value Range   Fecal Occult Bld NEGATIVE      Intake/Output Summary (Last 24 hours) at 10/02/11 0711 Last data filed at 10/02/11 0510  Gross per 24 hour  Intake    840 ml  Output   1600 ml  Net   -760 ml    ASSESSMENT AND PLAN:  CAD:  I have reviewed the cath films. There are no vessels amenable to PCI.  She might be a surgical candidate but she wants to avoid this.  I added Imdur and I will continue medical management.  Hyperlipidemia:  Not at target.  Her Lipitor was increased  yesterday.  Foot wound:  I appreciate the wound consult and instructions for care.  She is to follow up with Dr. Darrick Penna.  Elevated TSH:  TSH was elevated but T4 was only slightly low and T3 was within normal limits.  I will send this information to [facility], [facility], MD and defer management.  I am reluctant to start thyroid replacement in this situation as we manage her angina.  I discussed this with the patient and her son.    Cough:  I have stopped her ACE inhibitor.  Her BP is ok.    Fayrene Fearing [facility] 10/02/2011 7:11 AM

## 2011-10-06 ENCOUNTER — Ambulatory Visit (INDEPENDENT_AMBULATORY_CARE_PROVIDER_SITE_OTHER): Payer: Medicare Other | Admitting: Cardiology

## 2011-10-06 ENCOUNTER — Encounter: Payer: Self-pay | Admitting: Cardiology

## 2011-10-06 VITALS — BP 149/72 | HR 64 | Ht 62.0 in | Wt 130.1 lb

## 2011-10-06 DIAGNOSIS — I25810 Atherosclerosis of coronary artery bypass graft(s) without angina pectoris: Secondary | ICD-10-CM

## 2011-10-06 MED ORDER — ISOSORBIDE MONONITRATE ER 60 MG PO TB24: 90.0000 mg | ORAL_TABLET | Freq: Every day | ORAL | Status: DC

## 2011-10-06 NOTE — Patient Instructions (Addendum)
Please increase your Isosorbide to 90 mg a day (1 and 1/2 Tablet) Continue all other medications as listed  Follow in 6 weeks with Tereso Newcomer, PA

## 2011-10-06 NOTE — Progress Notes (Signed)
HPI The patient returns for followup of CAD and carotid stenosis.  She was discharged from the hospital a couple of days ago. She was admitted because she was having increasing chest discomfort at night and was using a nitroglycerin. I did review her films at that time and it was still clear that her coronary anatomy was not amenable to percutaneous revascularization. Surgery would be high risk and she didn't want to consider this. Therefore, we managed her medically.  Since going home she has had less chest discomfort though she had one episode yesterday while unloading a laundry. She took a nitroglycerin and it went away. She's not had any of the nighttime discomfort that she was having. She's not had any new shortness of breath, PND or orthopnea. She's had no palpitations, presyncope or syncope. She felt slightly lightheaded with the addition of Imdur but this has passed.  Allergies  Allergen Reactions  . Demerol Other (See Comments)    coma  . Benzoin     coma  . Cephalexin     Turns red  . Cephalosporins     coma  . Contrast Media (Iodinated Diagnostic Agents)     Passed out  . Dilaudid (Hydromorphone Hcl)     coma  . Insulins Swelling  . Lisinopril     cough  . Morphine And Related     coma  . Other     Patient highly sensitive to antibiotics, narcotics and any medication used to put patient to sleep for surgery.  . Penicillins     coma  . Sulfa Antibiotics     coma  . Aspirin     coma  . Latex Rash    Current Outpatient Prescriptions  Medication Sig Dispense Refill  . acetaminophen-codeine (TYLENOL #3) 300-30 MG per tablet Take 1 tablet by mouth every 4 (four) hours as needed. For pain      . aspirin EC 81 MG tablet Take 1 tablet (81 mg total) by mouth every other day.      Marland Kitchen atorvastatin (LIPITOR) 40 MG tablet Take 1 tablet (40 mg total) by mouth daily.  30 tablet  6  . Calcium Carbonate-Vitamin D (CALCIUM 600 + D PO) Take 1 tablet by mouth 2 (two) times daily.       . collagenase (SANTYL) ointment Apply topically daily. Apply to inner and outer right foot wound daily.  Cover with moist dressgin.  15 g  1  . DiphenhydrAMINE HCl (BENADRYL PO) Take 30 mLs by mouth as needed. For cough      . isosorbide mononitrate (IMDUR) 60 MG 24 hr tablet Take 1 tablet (60 mg total) by mouth daily.  30 tablet  6  . Melatonin 5 MG CAPS Take 1 capsule by mouth at bedtime as needed. To help sleep      . Methylsulfonylmethane (MSM) 1000 MG TABS Take 1 tablet by mouth daily.      . metoprolol tartrate (LOPRESSOR) 25 MG tablet Take 12.5 mg by mouth 2 (two) times daily. Take 1/2 tablet by mouth twice daily      . Multiple Vitamins-Minerals (ADULT GUMMY PO) Take 2 tablets by mouth daily.      . nitroGLYCERIN (NITROSTAT) 0.4 MG SL tablet Place 1 tablet (0.4 mg total) under the tongue every 5 (five) minutes x 3 doses as needed. For chest pain  25 tablet  3  . omeprazole (PRILOSEC) 10 MG capsule Take 10 mg by mouth daily.      Marland Kitchen  DISCONTD: amiodarone (PACERONE) 200 MG tablet Take 200 mg by mouth daily.        Past Medical History  Diagnosis Date  . Coronary artery disease     h/o NSTEMI in setting of AFib/sepsis (in [place]  LHC 04/06/11:  dLM 40%, oLAD 70%, then long 50%, then occluded, oDx 95%, p-mDx 60%, AVCFX occluded, oOM 90% with inf branch occluded, ext network of occluded OMs and PLs (fill s/w by collats), p+mRI 60%,, pRCA 40-50%, oAM 80%, EF 55-65%   . Atrial fibrillation     in setting of sepsis in [place]; tx with amiodarone (amio stopped 04/20/11);  Echo 02/26/11: Septal and apical hypokinesis, moderate LVE, EF 45-50%, posterior MAC with bileaflet mitral valve prolapse, posteriorly directed eccentric MR with moderate MR, mild BAE.  . Diverticulitis of colon (without mention of hemorrhage)   . Injury to liver   . Allergic rhinitis due to pollen   . Hyperlipidemia   . Hypertension   . Bone/cartilage disorder   . Vitamin d deficiency   . Anemia, unspecified   . Arthritis   .  Diabetes mellitus     borderline  . Diverticulitis of colon with perforation s/p colectomy/ostomy 13Oct2012 02/23/2011  . Carotid stenosis     02/2011 - RICA 80-99% and LICA 60-79%  . NSTEMI (non-ST elevated myocardial infarction) 10/2010    Echo 10/2010 (at [facility] in [facility]  mid ot apical septal AK, EF 40-45%, MAC, mild to mod MR, mild TR, no pulmo HTN    Past Surgical History  Procedure Date  . Appendectomy   . Total hip arthroplasty 07/2004    Right  . Colostomy 10/2010  . Cesarean section 1955, 1959, 1960  . Abdominal hysterectomy 1976  . Bladder repair 1976  . Abdominal adhesion surgery     at time of hysterectomy  . Joint replacement     Right  Hip    ROS:  As stated in the HPI and negative for all other systems.  PHYSICAL EXAM BP 149/72  Pulse 64  Ht 5\' 2"  (1.575 m)  Wt 130 lb 1.9 oz (59.022 kg)  BMI 23.80 kg/m2 GENERAL:  No distress NECK:  No jugular venous distention, waveform within normal limits, carotid upstroke brisk and symmetric, bilateral bruits right greater than left, no thyromegaly LYMPHATICS:  No cervical, inguinal adenopathy LUNGS:  Clear to auscultation bilaterally BACK:  No CVA tenderness CHEST:  Unremarkable HEART:  PMI not displaced or sustained,S1 and S2 within normal limits, no S3, no S4, no clicks, no rubs, apical holosystolic murmur 3/6 with radiation to the axilla and mildly out the outflow tract. ABD:  Flat, positive bowel sounds normal in frequency in pitch, no bruits, no rebound, no guarding, no midline pulsatile mass, no hepatomegaly, no splenomegaly, colostomy left lower quadrent EXT:  2 plus pulses upper and decreased DP/PT lower, no edema, no cyanosis no clubbing, healing right foot ulcers SKIN:  No rashes no nodules NEURO:  Cranial nerves II through XII grossly intact, motor grossly intact throughout PSYCH:  Cognitively intact, oriented to person place and time  EKG:  Sinus rhythm, rate 64, axis within normal limits, intervals  within normal limits, no acute ST-T wave changes.  10/06/2011  ASSESSMENT AND PLAN  Unstable angina -  Today I will increase her Imdur to 90 mg daily. I will continue med titration based on further symptoms.   Carotid artery stenosis, asymptomatic -  She has follow up with Dr. Darrick Penna this week.  Atrial fibrillation -  She has had no symptomatic recurrence of this. No change in therapy is indicated.  Foot wound -  She had a foot wound consult with recommendations for wound care while in the hospital and her wound is getting better. I have requested ABIs through Dr. Darrick Penna' office as he sees necessary and will defer to his management.  Cough -  I stopped her ACE inhibitor and her cough is getting better.  Dyslipidemia - We increased her Lipitor in the hospital and will followup with a lipid profile at the next visit.  Elevated TSH - Her TSH was markedly elevated in the hospital but T3 and T4 were normal. I have asked her to review this with [facility], MAHIMA, MD. We made no change her medicines.

## 2011-10-07 ENCOUNTER — Telehealth: Payer: Self-pay | Admitting: Vascular Surgery

## 2011-10-07 ENCOUNTER — Encounter: Payer: Self-pay | Admitting: Vascular Surgery

## 2011-10-07 ENCOUNTER — Other Ambulatory Visit: Payer: Self-pay | Admitting: Cardiology

## 2011-10-07 NOTE — Telephone Encounter (Signed)
Message copied by Allegra Grana on Wed Oct 07, 2011 11:16 AM ------      Message from: Melene Plan      Created: Wed Oct 07, 2011  9:23 AM                   ----- Message -----         From: Sherren Kerns, MD         Sent: 10/06/2011   7:01 PM           To: Melene Plan, RN            Can you make sure she gets ABI also at her appt.            Charles      ----- Message -----         From: Rollene Rotunda, MD         Sent: 10/06/2011   2:14 PM           To: Sherren Kerns, MD            Leonette Most,  She is now going to consider CEA.  She needs ABIs.  Can she have these when she comes in for her carotid Thursday?  Thanks. Leta Jungling

## 2011-10-07 NOTE — Telephone Encounter (Signed)
error 

## 2011-10-07 NOTE — Telephone Encounter (Signed)
Per pt request who was at the pharmacy ([facility] in [place])  Pharmacy called stated the pt had just had their isosorbide mononitrate (IMDURE) 60 MG 24 hr tablets - 1 talbet qd was increased to - 90 MG = 1 & 1/2 tablets qd.  She had just had her original Rx of isosorbide mononitrate (IMDURE)  60 MG qd -filled 2 days ago, and asked for a 1-time Rx of isosorbide mononitrate (IMDURE) 30 MG 24 tablet, to make her new dose of isosorbide mononitrate (IMDURE) 90 MG, correct for this month.  Pt is aware, her next refill with call for isosorbide mononitrate (IMDURE) 60 MG tablet, take 1 & 1/2 tablets daily. Caralee Ates, CMA

## 2011-10-07 NOTE — Telephone Encounter (Signed)
Appointment scheduled as requested.   Order received via fax.    Julia Greene

## 2011-10-08 ENCOUNTER — Other Ambulatory Visit: Payer: Self-pay

## 2011-10-08 ENCOUNTER — Other Ambulatory Visit: Payer: Self-pay | Admitting: *Deleted

## 2011-10-08 ENCOUNTER — Ambulatory Visit (INDEPENDENT_AMBULATORY_CARE_PROVIDER_SITE_OTHER): Payer: Medicare Other | Admitting: Vascular Surgery

## 2011-10-08 ENCOUNTER — Encounter (INDEPENDENT_AMBULATORY_CARE_PROVIDER_SITE_OTHER): Payer: Medicare Other | Admitting: *Deleted

## 2011-10-08 ENCOUNTER — Encounter: Payer: Self-pay | Admitting: Vascular Surgery

## 2011-10-08 ENCOUNTER — Other Ambulatory Visit (INDEPENDENT_AMBULATORY_CARE_PROVIDER_SITE_OTHER): Payer: Medicare Other | Admitting: *Deleted

## 2011-10-08 VITALS — BP 152/48 | HR 46 | Resp 16 | Ht 62.0 in | Wt 131.3 lb

## 2011-10-08 DIAGNOSIS — I6529 Occlusion and stenosis of unspecified carotid artery: Secondary | ICD-10-CM

## 2011-10-08 DIAGNOSIS — Z01810 Encounter for preprocedural cardiovascular examination: Secondary | ICD-10-CM

## 2011-10-08 DIAGNOSIS — I739 Peripheral vascular disease, unspecified: Secondary | ICD-10-CM

## 2011-10-08 NOTE — Progress Notes (Signed)
[facility] [place] HISTORY AND PHYSICAL   History of Present Illness:  Julia Greene is a 78 y.o. year old female who presents for evaluation of a known high-grade right carotid stenosis and moderate left carotid stenosis.  The Julia Greene was offered carotid endarterectomy on her previous office visit however she refused. She returns today for further followup. She is followed by Dr. Antoine Poche for cardiology issues and is thought to be moderate risk. She had a recent hospital admission with angina. She was reevaluated by Dr. Antoine Poche at that time. She denies any symptoms of TIA amaurosis or stroke. Both of her sons are present for the office visit today.   In addition she has now developed an ulceration on her right foot. Her primary care physician thought this might be related to peripheral arterial disease. She is here for evaluation of this today as well. She denies claudication. However she is minimally ambulatory. She is currently applying collagenase ointment to the ulcers on her right foot. She does not know how they occurred.  Other medical problems include hyperlipidemia, hypertension, coronary disease and borderline diabetes. These are all currently controlled.  Past Medical History  Diagnosis Date  . Coronary artery disease     h/o NSTEMI in setting of AFib/sepsis (in [place]  LHC 04/06/11:  dLM 40%, oLAD 70%, then long 50%, then occluded, oDx 95%, p-mDx 60%, AVCFX occluded, oOM 90% with inf branch occluded, ext network of occluded OMs and PLs (fill s/w by collats), p+mRI 60%,, pRCA 40-50%, oAM 80%, EF 55-65%   . Atrial fibrillation     in setting of sepsis in [place]; tx with amiodarone (amio stopped 04/20/11);  Echo 02/26/11: Septal and apical hypokinesis, moderate LVE, EF 45-50%, posterior MAC with bileaflet mitral valve prolapse, posteriorly directed eccentric MR with moderate MR, mild BAE.  . Diverticulitis of colon (without mention of hemorrhage)   . Injury to liver   . Allergic  rhinitis due to pollen   . Hyperlipidemia   . Hypertension   . Bone/cartilage disorder   . Vitamin d deficiency   . Anemia, unspecified   . Arthritis   . Diabetes mellitus     borderline  . Diverticulitis of colon with perforation s/p colectomy/ostomy 13Oct2012 02/23/2011  . Carotid stenosis     02/2011 - RICA 80-99% and LICA 60-79%  . NSTEMI (non-ST elevated myocardial infarction) 10/2010    Echo 10/2010 (at [facility]):  mid ot apical septal AK, EF 40-45%, MAC, mild to mod MR, mild TR, no pulmo HTN    Past Surgical History  Procedure Date  . Appendectomy   . Total hip arthroplasty 07/2004    Right  . Colostomy 10/2010  . Cesarean section 1955, 1959, 1960  . Abdominal hysterectomy 1976  . Bladder repair 1976  . Abdominal adhesion surgery     at time of hysterectomy  . Joint replacement 2006    Right  Hip     Social History History  Substance Use Topics  . Smoking status: Never Smoker   . Smokeless tobacco: Never Used  . Alcohol Use: No    Family History Family History  Problem Relation Age of Onset  . Heart disease Mother 13    Pacemaker, No CAD Heart Disease before age 36  . Diverticulitis Mother   . Diabetes Mother   . Hyperlipidemia Mother   . Hypertension Mother   . Lung disease Father 15    Smoking  . Ovarian cancer Daughter   .  Cancer Daughter   . Colon cancer Neg Hx   . Heart disease Maternal Grandmother   . Diabetes Maternal Aunt     x 2  . Diabetes Sister   . Heart disease Sister     Heart Disease before age 24  . Hyperlipidemia Sister   . Hypertension Sister     Allergies  Allergies  Allergen Reactions  . Demerol Other (See Comments)    coma  . Benzoin     coma  . Cephalexin     Turns red  . Cephalosporins     coma  . Contrast Media (Iodinated Diagnostic Agents)     Passed out  . Dilaudid (Hydromorphone Hcl)     coma  . Insulins Swelling  . Lisinopril     cough  . Morphine And Related     coma  . Other      Julia Greene highly sensitive to antibiotics, narcotics and any medication used to put Julia Greene to sleep for surgery.  . Penicillins     coma  . Sulfa Antibiotics     coma  . Aspirin     coma  . Latex Rash     Current Outpatient Prescriptions  Medication Sig Dispense Refill  . acetaminophen-codeine (TYLENOL #3) 300-30 MG per tablet Take 1 tablet by mouth every 4 (four) hours as needed. For pain      . aspirin EC 81 MG tablet Take 1 tablet (81 mg total) by mouth every other day.      Marland Kitchen atorvastatin (LIPITOR) 40 MG tablet Take 1 tablet (40 mg total) by mouth daily.  30 tablet  6  . Calcium Carbonate-Vitamin D (CALCIUM 600 + D PO) Take 1 tablet by mouth 2 (two) times daily.      . collagenase (SANTYL) ointment Apply topically daily. Apply to inner and outer right foot wound daily.  Cover with moist dressgin.  15 g  1  . DiphenhydrAMINE HCl (BENADRYL PO) Take 30 mLs by mouth as needed. For cough      . isosorbide mononitrate (IMDUR) 60 MG 24 hr tablet Take 1.5 tablets (90 mg total) by mouth daily.  135 tablet  3  . Melatonin 5 MG CAPS Take 1 capsule by mouth at bedtime as needed. To help sleep      . Methylsulfonylmethane (MSM) 1000 MG TABS Take 1 tablet by mouth daily.      . metoprolol tartrate (LOPRESSOR) 25 MG tablet Take 12.5 mg by mouth 2 (two) times daily. Take 1/2 tablet by mouth twice daily      . Multiple Vitamins-Minerals (ADULT GUMMY PO) Take 2 tablets by mouth daily.      . nitroGLYCERIN (NITROSTAT) 0.4 MG SL tablet Place 1 tablet (0.4 mg total) under the tongue every 5 (five) minutes x 3 doses as needed. For chest pain  25 tablet  3  . omeprazole (PRILOSEC) 10 MG capsule Take 10 mg by mouth daily.      Marland Kitchen DISCONTD: amiodarone (PACERONE) 200 MG tablet Take 200 mg by mouth daily.        ROS:   General:  No weight loss, Fever, chills  HEENT: No recent headaches, no nasal bleeding, no visual changes, no sore throat  Neurologic: No dizziness, blackouts, seizures. No recent symptoms  of stroke or mini- stroke. No recent episodes of slurred speech, or temporary blindness.  Cardiac: + recent episodes of chest pain/pressure, no shortness of breath at rest.  No shortness of breath with exertion  Vascular: No history of rest pain in feet.  No history of claudication.  No history of non-healing ulcer, No history of DVT   Pulmonary: No home oxygen, no productive cough, no hemoptysis,  No asthma or wheezing  Musculoskeletal:  [ ]  Arthritis, [ ]  Low back pain,  [ ]  Joint pain  Hematologic:No history of hypercoagulable state.  No history of easy bleeding.  No history of anemia  Gastrointestinal: No hematochezia or melena,  No gastroesophageal reflux, no trouble swallowing  Urinary: [ ]  chronic Kidney disease, [ ]  on HD - [ ]  MWF or [ ]  TTHS, [ ]  Burning with urination, [ ]  Frequent urination, [ ]  Difficulty urinating;   Skin: No rashes  Psychological: No history of anxiety,  No history of depression   Physical Examination  Filed Vitals:   10/08/11 1238 10/08/11 1242  BP: 151/80 152/48  Pulse: 61 46  Resp: 16   Height: 5\' 2"  (1.575 m)   Weight: 131 lb 4.8 oz (59.557 kg)   SpO2: 99% 96%    Body mass index is 24.01 kg/(m^2).  General:  Alert and oriented, no acute distress HEENT: Normal Neck: Harsh right-sided bruit no JVD Pulmonary: Clear to auscultation bilaterally Cardiac: Regular Rate and Rhythm without murmur Abdomen: Soft, non-tender, non-distended, no mass, colostomy Skin: No rash, right lateral fifth toe 1 cm ulceration with clean base right dorsal foot with 1 cm open ulceration clean base Extremity Pulses:  2+ radial, brachial, femoral, absent dorsalis pedis, posterior tibial pulses bilaterally Musculoskeletal: No deformity or edema  Neurologic: Upper and lower extremity motor 5/5 and symmetric  DATA: The Julia Greene had a bilateral carotid duplex ultrasound today as well as bilateral ABIs. I reviewed and interpreted both studies. The carotid duplex shows a  greater than 80% right internal carotid artery stenosis and a 40-60% left internal carotid artery stenosis there is antegrade vertebral flow bilaterally. ABI on the right was 0.55 left was 0.93 toe pressure on the right was 41 left was 76  ASSESSMENT: #1 high-grade asymptomatic right internal carotid artery stenosis. At this point the Julia Greene has consented a carotid endarterectomy. Risks benefits possible complications and procedure details were explained the Julia Greene and her 2 sons today. These include but are not limited to bleeding infection stroke risk of 1-2% myocardial risk as previously described by Dr. Antoine Poche, cranial nerve injury 10-15%.  Right carotid endarterectomy scheduled for September 24  #2 peripheral arterial disease with nonhealing ulcer right foot she most likely has superficial femoral artery occlusive disease we will do further evaluation of this after the carotid issue has been addressed continue local wound care   PLAN:  See above  Fabienne Bruns, MD [facility] [place] [facility] Office: [phone] Pager: [phone]

## 2011-10-09 ENCOUNTER — Encounter (HOSPITAL_COMMUNITY): Payer: Self-pay | Admitting: Pharmacy Technician

## 2011-10-13 ENCOUNTER — Encounter (HOSPITAL_COMMUNITY)
Admission: RE | Admit: 2011-10-13 | Discharge: 2011-10-13 | Disposition: A | Discharge status: Home / Self Care | Payer: Medicare Other | Source: Ambulatory Visit | Attending: Vascular Surgery | Admitting: Vascular Surgery

## 2011-10-13 ENCOUNTER — Encounter (HOSPITAL_COMMUNITY): Payer: Self-pay

## 2011-10-13 HISTORY — DX: Adverse effect of unspecified anesthetic, initial encounter: T41.45XA

## 2011-10-13 HISTORY — DX: Other complications of anesthesia, initial encounter: T88.59XA

## 2011-10-13 LAB — COMPREHENSIVE METABOLIC PANEL
AST: 17 U/L (ref 0–37)
Albumin: 3.6 g/dL (ref 3.5–5.2)
Alkaline Phosphatase: 66 U/L (ref 39–117)
CO2: 28 mEq/L (ref 19–32)
Chloride: 103 mEq/L (ref 96–112)
GFR calc non Af Amer: 66 mL/min — ABNORMAL LOW (ref >90)
Potassium: 4.3 mEq/L (ref 3.5–5.1)
Total Bilirubin: 0.2 mg/dL — ABNORMAL LOW (ref 0.3–1.2)

## 2011-10-13 LAB — CBC
HCT: 28.5 % — ABNORMAL LOW (ref 36.0–46.0)
Platelets: 252 10*3/uL (ref 150–400)
RBC: 3.07 MIL/uL — ABNORMAL LOW (ref 3.87–5.11)
RDW: 13.6 % (ref 11.5–15.5)
WBC: 10.3 10*3/uL (ref 4.0–10.5)

## 2011-10-13 LAB — URINE MICROSCOPIC-ADD ON

## 2011-10-13 LAB — SURGICAL PCR SCREEN
MRSA, PCR: POSITIVE — AB
Staphylococcus aureus: POSITIVE — AB

## 2011-10-13 LAB — APTT: aPTT: 29 seconds (ref 24–37)

## 2011-10-13 LAB — URINALYSIS, ROUTINE W REFLEX MICROSCOPIC
Glucose, UA: NEGATIVE mg/dL
Protein, ur: NEGATIVE mg/dL
Specific Gravity, Urine: 1.024 (ref 1.005–1.030)
pH: 5 (ref 5.0–8.0)

## 2011-10-13 LAB — ABO/RH: ABO/RH(D): O POS

## 2011-10-13 LAB — PROTIME-INR: Prothrombin Time: 13.8 seconds (ref 11.6–15.2)

## 2011-10-13 NOTE — Pre-Procedure Instructions (Signed)
20 Julia Greene   10/13/2011   Your procedure is scheduled on: September 24TH, Tuesday    Report to [facility] at 5:30 AM.             Surgery time is posted from 7:30 Am to 10:47 Am   Call this number if you have problems the morning of surgery: [phone]   Remember:   Do not eat food or drink any liquids:After Midnight Monday   Take these medicines the morning of surgery with A SIP OF WATER: Imdur, Metoprolol, Omeprazole   Do not wear jewelry, make-up or nail polish.  Do not wear lotions, powders, or perfumes. You may NOT wear deodorant.  Ladies--Do not shave 48 hours prior to surgery.    Do not bring valuables to the hospital.  Contacts, dentures or bridgework may not be worn into surgery.   Leave suitcase in the car. After surgery it may be brought to your room.  For patients admitted to the hospital, checkout time is 11:00 AM the day of discharge.   Patients discharged the day of surgery will not be allowed to drive home.   Name and phone number of your driver:    Special Instructions: CHG Shower Use Special Wash: 1/2 bottle night before surgery and 1/2 bottle morning of surgery.   Please read over the following fact sheets that you were given: Pain Booklet, Coughing and Deep Breathing, Blood Transfusion Information, MRSA Information and Surgical Site Infection Prevention

## 2011-10-13 NOTE — Progress Notes (Signed)
Pt notified of PCR results(+/+).  Abnormal labs(Hgb=9.6, Hct=28.5) noted at preadmission visit.  Jaynie Collins, Anesthesia PA, notified and given chart to review.

## 2011-10-13 NOTE — Progress Notes (Signed)
Pt here for preadmission accompanied by daughter-in-law, Lucerito Rosinski. All info re: heart test in EPIC.  Pt denies being dx'd w/sleep apnea but presents w/ score of 4 on sleep apnea questionnaire. Pt reportshospitalized in 10/2010 w/Sepsis from ruptured diverticuli and now has colostomy.  Pt and daughter-in-law verbalized understanding w/ verbalization back of preop instructions, etc.//l. Nesbit Michon,RN

## 2011-10-14 NOTE — Consult Note (Addendum)
Anesthesia chart review: Patient is a 78 year old female scheduled for right carotid endarterectomy by Dr. Darrick Penna on 10/20/2011.  History includes non-smoker, CAD, NSTEMI 10/2010, afib in the setting of sepsis 01/2011, MVP with moderate MR by 01/2011 echo, DM2 diet controlled, HTN, HLD, anemia, colon resection with colostomy for ruptured diverticulum 02/23/11. She has multiple antibiotic and narcotic allergies.  PCP is Dr. Oneal Grout.  Her Cardiologist is Dr. Warsaw Lions.  He last saw her on 10/06/11 for hospital follow-up for unstable angina.  Imdur was added to her medication regimen (now up to 90 mg daily), and her symptoms improved. Her EKG on 10/06/11 showed SR with occasional PVCs.  Dr. Antoine Poche is aware that patient now wants to proceed with carotid endarterectomy, and his office actually arranged further follow-up with Dr. Darrick Penna to discuss CEA as she initially wanted to try chelation therapy (see Dr. Jenene Slicker 10/06/11 office note and telephone message from 10/07/11 by Standley Dakins).  According to Dr. Evelina Dun note (05/21/11), he called Dr. Antoine Poche to discuss patient's operative risk since Dr. Antoine Poche thought she was too high risk for a colostomy takedown.  Dr. Evelina Dun documented that, "He [Dr. Hochrein] thinks she would be at moderate risk for myocardial events perioperatively."     She had an abnormal stress test (as part of a preoperative evaluation for colostomy takedown) on 03/13/11 showing moderate ischemia affecting the entire septum and possibly a small part of the anterior wall.  She subsequently had a cardiac cath on 04/06/11 that showed: "dLM 40%, oLAD 70%, then long 50%, then occluded, oDx 95%, p-mDx 60%, AVCFX occluded, oOM 90% with inf branch occluded, ext network of occluded OMs and PLs (fill s/w by collats), p+mRI 60%,, pRCA 40-50%, oAM 80%, EF 55-65%." Recommendations: The patient's coronary disease is long-standing severe diffuse and extensively collateralized. At this point there are no  high grade lesions amenable to percutaneous revascularization. Any attempts at surgical revascularization would be very incomplete. Given this medical management and continued attempts at risk reduction is the preferred therapy. Given her extensive disease she would be at moderately high risk for cardiovascular events with elective bowel surgery (Dr. Antoine Poche).  Dr. Michaell Cowing did not think the risks would out weight the benefits, and she subsequently opted not to undergo takedown on her colostomy.    Echo on 02/26/11 showed: - Left ventricle: Septal and apical hypokinesis The cavity size was moderately dilated. Wall thickness was normal. Systolic function was mildly reduced. The estimated ejection fraction was in the range of 45% to 50%. - Mitral valve: Posterior MAC with bileaflet prolapse. Eccentric MR directed posteriorly. Moderate regurgitation. - Left atrium: The atrium was mildly dilated. - Right atrium: The atrium was mildly dilated. - Atrial septum: No defect or patent foramen ovale was Identified. - Trivial pulmonic regurgitation. - Mild tricuspid regurgitation.  CXR on 09/30/11 showed: No active lung disease. Slight hyperaeration. Borderline  cardiomegaly.  Labs noted.  Cr 0.82, glucose 145.  H/H 9.6/28.5 (up from 10/02/11). T&S done.  (Will change to T&C.)   PT/PTT WNL.  UA shows moderate leukocytes, negative nitrites.  Oswaldo Done, RN at [facility] notified of H/H and UA results.  I reviewed cardiac history and documented communication from Dr. Darrick Penna and Dr. Antoine Poche with Anesthesiologist Dr. Ivin Booty.  If no significant change in her CV status then anticipate she can proceed as planned.  Shonna Chock, PA-C

## 2011-10-16 ENCOUNTER — Telehealth: Payer: Self-pay | Admitting: Cardiology

## 2011-10-16 DIAGNOSIS — I25810 Atherosclerosis of coronary artery bypass graft(s) without angina pectoris: Secondary | ICD-10-CM

## 2011-10-16 MED ORDER — ISOSORBIDE MONONITRATE ER 120 MG PO TB24: 120.0000 mg | ORAL_TABLET | Freq: Every day | ORAL | Status: DC

## 2011-10-16 NOTE — Telephone Encounter (Signed)
Pt calling stating that she had to use SL Ntg during the day yesterday for chest heaviness and discomfort that went down both arms. This was relieved with SL Ntg.  She is asking if she should increase her Isosorbide to 120 mg a day and take 60 in the am and 60 in the pm.  Advised pt I will need to discuss with Dr Antoine Poche first before she makes that change.  She also reports her cough has continued despite discontinuing  Lisinopril as ordered.  She reports nausea and vomiting with Lipitor and has stopped it.  Pt aware I will call back after reviewing with MD.

## 2011-10-16 NOTE — Telephone Encounter (Signed)
Pt aware and will increase to 120 mg daily.  She does not want a new RX at this time and will call back for it when needed.

## 2011-10-16 NOTE — Telephone Encounter (Signed)
She can increase to 120 mg daily but it needs to be all at one time.

## 2011-10-16 NOTE — Telephone Encounter (Signed)
New problem:  C/o side effect from lipitor 40 mg. Upcoming surgery on 9/24 with Dr. Darrick Penna.

## 2011-10-19 MED ORDER — VANCOMYCIN HCL IN DEXTROSE 1-5 GM/200ML-% IV SOLN
1000.0000 mg | INTRAVENOUS | Status: AC
  Administered 2011-10-20: 1000 mg via INTRAVENOUS
  Filled 2011-10-19 (×2): qty 200

## 2011-10-19 MED ORDER — SODIUM CHLORIDE 0.9 % IV SOLN: INTRAVENOUS | Status: DC

## 2011-10-20 ENCOUNTER — Inpatient Hospital Stay (HOSPITAL_COMMUNITY)
Admission: RE | Admit: 2011-10-20 | Discharge: 2011-10-21 | DRG: 039 | Disposition: A | Discharge status: Home / Self Care | Payer: Medicare Other | Source: Ambulatory Visit | Attending: Vascular Surgery | Admitting: Vascular Surgery

## 2011-10-20 ENCOUNTER — Encounter (HOSPITAL_COMMUNITY): Payer: Self-pay | Admitting: Vascular Surgery

## 2011-10-20 ENCOUNTER — Encounter (HOSPITAL_COMMUNITY)
Admission: RE | Disposition: A | Discharge status: Home / Self Care | Payer: Self-pay | Source: Ambulatory Visit | Attending: Vascular Surgery

## 2011-10-20 ENCOUNTER — Inpatient Hospital Stay (HOSPITAL_COMMUNITY): Payer: Medicare Other | Admitting: Vascular Surgery

## 2011-10-20 ENCOUNTER — Encounter (HOSPITAL_COMMUNITY): Payer: Self-pay | Admitting: *Deleted

## 2011-10-20 DIAGNOSIS — I658 Occlusion and stenosis of other precerebral arteries: Secondary | ICD-10-CM | POA: Diagnosis present

## 2011-10-20 DIAGNOSIS — E785 Hyperlipidemia, unspecified: Secondary | ICD-10-CM | POA: Diagnosis present

## 2011-10-20 DIAGNOSIS — L97509 Non-pressure chronic ulcer of other part of unspecified foot with unspecified severity: Secondary | ICD-10-CM | POA: Diagnosis present

## 2011-10-20 DIAGNOSIS — I10 Essential (primary) hypertension: Secondary | ICD-10-CM | POA: Diagnosis present

## 2011-10-20 DIAGNOSIS — L98499 Non-pressure chronic ulcer of skin of other sites with unspecified severity: Secondary | ICD-10-CM | POA: Diagnosis present

## 2011-10-20 DIAGNOSIS — Z01812 Encounter for preprocedural laboratory examination: Secondary | ICD-10-CM

## 2011-10-20 DIAGNOSIS — E559 Vitamin D deficiency, unspecified: Secondary | ICD-10-CM | POA: Diagnosis present

## 2011-10-20 DIAGNOSIS — I6529 Occlusion and stenosis of unspecified carotid artery: Secondary | ICD-10-CM

## 2011-10-20 DIAGNOSIS — I4891 Unspecified atrial fibrillation: Secondary | ICD-10-CM | POA: Diagnosis present

## 2011-10-20 DIAGNOSIS — I25810 Atherosclerosis of coronary artery bypass graft(s) without angina pectoris: Secondary | ICD-10-CM

## 2011-10-20 DIAGNOSIS — I219 Acute myocardial infarction, unspecified: Secondary | ICD-10-CM

## 2011-10-20 DIAGNOSIS — I2510 Atherosclerotic heart disease of native coronary artery without angina pectoris: Secondary | ICD-10-CM | POA: Diagnosis present

## 2011-10-20 DIAGNOSIS — I739 Peripheral vascular disease, unspecified: Secondary | ICD-10-CM | POA: Diagnosis present

## 2011-10-20 DIAGNOSIS — M129 Arthropathy, unspecified: Secondary | ICD-10-CM | POA: Diagnosis present

## 2011-10-20 DIAGNOSIS — Z7982 Long term (current) use of aspirin: Secondary | ICD-10-CM

## 2011-10-20 DIAGNOSIS — D649 Anemia, unspecified: Secondary | ICD-10-CM | POA: Diagnosis present

## 2011-10-20 DIAGNOSIS — Z96649 Presence of unspecified artificial hip joint: Secondary | ICD-10-CM

## 2011-10-20 DIAGNOSIS — Z79899 Other long term (current) drug therapy: Secondary | ICD-10-CM

## 2011-10-20 DIAGNOSIS — I252 Old myocardial infarction: Secondary | ICD-10-CM

## 2011-10-20 DIAGNOSIS — R7309 Other abnormal glucose: Secondary | ICD-10-CM | POA: Diagnosis present

## 2011-10-20 HISTORY — PX: ENDARTERECTOMY: SHX5162

## 2011-10-20 LAB — GLUCOSE, CAPILLARY: Glucose-Capillary: 111 mg/dL — ABNORMAL HIGH (ref 70–99)

## 2011-10-20 LAB — CREATININE, SERUM
GFR calc Af Amer: >90 mL/min (ref >90)
GFR calc non Af Amer: 78 mL/min — ABNORMAL LOW (ref >90)

## 2011-10-20 LAB — CBC
HCT: 26.2 % — ABNORMAL LOW (ref 36.0–46.0)
MCV: 91.6 fL (ref 78.0–100.0)
Platelets: 215 10*3/uL (ref 150–400)
RBC: 2.86 MIL/uL — ABNORMAL LOW (ref 3.87–5.11)
RDW: 13.5 % (ref 11.5–15.5)
WBC: 8.9 10*3/uL (ref 4.0–10.5)

## 2011-10-20 SURGERY — ENDARTERECTOMY, CAROTID
Anesthesia: General | Site: Neck | Laterality: Right | Wound class: Clean

## 2011-10-20 MED ORDER — METOPROLOL TARTRATE 12.5 MG HALF TABLET
12.5000 mg | ORAL_TABLET | Freq: Two times a day (BID) | ORAL | Status: DC
  Administered 2011-10-20 – 2011-10-21 (×2): 12.5 mg via ORAL
  Filled 2011-10-20 (×3): qty 1

## 2011-10-20 MED ORDER — ONDANSETRON HCL 4 MG/2ML IJ SOLN: 4.0000 mg | Freq: Four times a day (QID) | INTRAMUSCULAR | Status: DC | PRN

## 2011-10-20 MED ORDER — PROPOFOL 10 MG/ML IV BOLUS
INTRAVENOUS | Status: DC | PRN
  Administered 2011-10-20: 80 mg via INTRAVENOUS

## 2011-10-20 MED ORDER — SODIUM CHLORIDE 0.9 % IV SOLN
10.0000 mg | INTRAVENOUS | Status: DC | PRN
  Administered 2011-10-20: 25 ug/min via INTRAVENOUS

## 2011-10-20 MED ORDER — ACETAMINOPHEN 325 MG PO TABS: 325.0000 mg | ORAL_TABLET | ORAL | Status: DC | PRN

## 2011-10-20 MED ORDER — MELATONIN 5 MG PO CAPS: 5.0000 mg | ORAL_CAPSULE | Freq: Every evening | ORAL | Status: DC | PRN

## 2011-10-20 MED ORDER — DEXTROSE 5 % IV SOLN: 1.5000 g | Freq: Two times a day (BID) | INTRAVENOUS | Status: DC

## 2011-10-20 MED ORDER — MAGNESIUM SULFATE 40 MG/ML IJ SOLN
2.0000 g | Freq: Once | INTRAMUSCULAR | Status: AC | PRN
  Filled 2011-10-20: qty 50

## 2011-10-20 MED ORDER — MSM 1000 MG PO TABS: 1.0000 | ORAL_TABLET | Freq: Every day | ORAL | Status: DC

## 2011-10-20 MED ORDER — FENTANYL CITRATE 0.05 MG/ML IJ SOLN
25.0000 ug | INTRAMUSCULAR | Status: DC | PRN
  Administered 2011-10-20: 50 ug via INTRAVENOUS
  Administered 2011-10-20: 25 ug via INTRAVENOUS

## 2011-10-20 MED ORDER — HYDROMORPHONE HCL PF 1 MG/ML IJ SOLN: 0.2500 mg | INTRAMUSCULAR | Status: DC | PRN

## 2011-10-20 MED ORDER — THROMBIN 20000 UNITS EX KIT
PACK | CUTANEOUS | Status: DC | PRN
  Administered 2011-10-20: 10:00:00 via TOPICAL

## 2011-10-20 MED ORDER — GUAIFENESIN-DM 100-10 MG/5ML PO SYRP: 15.0000 mL | ORAL_SOLUTION | ORAL | Status: DC | PRN

## 2011-10-20 MED ORDER — POTASSIUM CHLORIDE CRYS ER 20 MEQ PO TBCR: 20.0000 meq | EXTENDED_RELEASE_TABLET | Freq: Once | ORAL | Status: DC | PRN

## 2011-10-20 MED ORDER — NITROGLYCERIN 0.4 MG SL SUBL: 0.4000 mg | SUBLINGUAL_TABLET | SUBLINGUAL | Status: DC | PRN

## 2011-10-20 MED ORDER — HYDRALAZINE HCL 20 MG/ML IJ SOLN: 10.0000 mg | INTRAMUSCULAR | Status: DC | PRN

## 2011-10-20 MED ORDER — SODIUM CHLORIDE 0.9 % IR SOLN
Status: DC | PRN
  Administered 2011-10-20: 08:00:00

## 2011-10-20 MED ORDER — ONDANSETRON HCL 4 MG/2ML IJ SOLN
INTRAMUSCULAR | Status: DC | PRN
  Administered 2011-10-20: 4 mg via INTRAVENOUS

## 2011-10-20 MED ORDER — HEPARIN SODIUM (PORCINE) 5000 UNIT/ML IJ SOLN
5000.0000 [IU] | Freq: Three times a day (TID) | INTRAMUSCULAR | Status: DC
  Administered 2011-10-20 – 2011-10-21 (×2): 5000 [IU] via SUBCUTANEOUS
  Filled 2011-10-20 (×5): qty 1

## 2011-10-20 MED ORDER — ACETAMINOPHEN 10 MG/ML IV SOLN: 1000.0000 mg | Freq: Once | INTRAVENOUS | Status: DC | PRN

## 2011-10-20 MED ORDER — FENTANYL CITRATE 0.05 MG/ML IJ SOLN
INTRAMUSCULAR | Status: DC | PRN
  Administered 2011-10-20 (×2): 100 ug via INTRAVENOUS

## 2011-10-20 MED ORDER — FENTANYL CITRATE 0.05 MG/ML IJ SOLN
INTRAMUSCULAR | Status: AC
  Filled 2011-10-20: qty 2

## 2011-10-20 MED ORDER — NEOSTIGMINE METHYLSULFATE 1 MG/ML IJ SOLN
INTRAMUSCULAR | Status: DC | PRN
  Administered 2011-10-20: 4 mg via INTRAVENOUS

## 2011-10-20 MED ORDER — ACETAMINOPHEN 650 MG RE SUPP: 325.0000 mg | RECTAL | Status: DC | PRN

## 2011-10-20 MED ORDER — MIDAZOLAM HCL 5 MG/5ML IJ SOLN
INTRAMUSCULAR | Status: DC | PRN
  Administered 2011-10-20: 1 mg via INTRAVENOUS

## 2011-10-20 MED ORDER — SODIUM CHLORIDE 0.9 % IV SOLN: 500.0000 mL | Freq: Once | INTRAVENOUS | Status: AC | PRN

## 2011-10-20 MED ORDER — METOPROLOL TARTRATE 12.5 MG HALF TABLET
12.5000 mg | ORAL_TABLET | Freq: Once | ORAL | Status: AC
  Administered 2011-10-20: 12.5 mg via ORAL

## 2011-10-20 MED ORDER — ROCURONIUM BROMIDE 100 MG/10ML IV SOLN
INTRAVENOUS | Status: DC | PRN
  Administered 2011-10-20: 20 mg via INTRAVENOUS
  Administered 2011-10-20: 30 mg via INTRAVENOUS

## 2011-10-20 MED ORDER — METOPROLOL TARTRATE 1 MG/ML IV SOLN: 2.0000 mg | INTRAVENOUS | Status: DC | PRN

## 2011-10-20 MED ORDER — CHLORHEXIDINE GLUCONATE CLOTH 2 % EX PADS
6.0000 | MEDICATED_PAD | Freq: Every day | CUTANEOUS | Status: DC
  Administered 2011-10-21: 6 via TOPICAL

## 2011-10-20 MED ORDER — MUPIROCIN 2 % EX OINT
1.0000 "application " | TOPICAL_OINTMENT | Freq: Two times a day (BID) | CUTANEOUS | Status: DC
  Administered 2011-10-20 – 2011-10-21 (×2): 1 via NASAL
  Filled 2011-10-20: qty 22

## 2011-10-20 MED ORDER — DOPAMINE-DEXTROSE 3.2-5 MG/ML-% IV SOLN: 3.0000 ug/kg/min | INTRAVENOUS | Status: DC | PRN

## 2011-10-20 MED ORDER — PHENOL 1.4 % MT LIQD: 1.0000 | OROMUCOSAL | Status: DC | PRN

## 2011-10-20 MED ORDER — LABETALOL HCL 5 MG/ML IV SOLN: 10.0000 mg | INTRAVENOUS | Status: DC | PRN

## 2011-10-20 MED ORDER — LIDOCAINE HCL (CARDIAC) 20 MG/ML IV SOLN
INTRAVENOUS | Status: DC | PRN
  Administered 2011-10-20: 50 mg via INTRAVENOUS
  Administered 2011-10-20: 40 mg via INTRAVENOUS

## 2011-10-20 MED ORDER — VANCOMYCIN HCL 500 MG IV SOLR
500.0000 mg | Freq: Two times a day (BID) | INTRAVENOUS | Status: AC
  Administered 2011-10-20 – 2011-10-21 (×2): 500 mg via INTRAVENOUS
  Filled 2011-10-20 (×2): qty 500

## 2011-10-20 MED ORDER — TRAMADOL HCL 50 MG PO TABS: 50.0000 mg | ORAL_TABLET | Freq: Three times a day (TID) | ORAL | Status: DC | PRN

## 2011-10-20 MED ORDER — KCL IN DEXTROSE-NACL 20-5-0.45 MEQ/L-%-% IV SOLN
INTRAVENOUS | Status: DC
  Administered 2011-10-20: 19:00:00 via INTRAVENOUS
  Filled 2011-10-20 (×2): qty 1000

## 2011-10-20 MED ORDER — 0.9 % SODIUM CHLORIDE (POUR BTL) OPTIME
TOPICAL | Status: DC | PRN
  Administered 2011-10-20: 2000 mL

## 2011-10-20 MED ORDER — PROTAMINE SULFATE 10 MG/ML IV SOLN
INTRAVENOUS | Status: DC | PRN
  Administered 2011-10-20: 10 mg via INTRAVENOUS

## 2011-10-20 MED ORDER — PANTOPRAZOLE SODIUM 40 MG PO TBEC
40.0000 mg | DELAYED_RELEASE_TABLET | Freq: Every day | ORAL | Status: DC
  Administered 2011-10-21: 40 mg via ORAL
  Filled 2011-10-20: qty 1

## 2011-10-20 MED ORDER — EPHEDRINE SULFATE 50 MG/ML IJ SOLN
INTRAMUSCULAR | Status: DC | PRN
  Administered 2011-10-20 (×3): 5 mg via INTRAVENOUS

## 2011-10-20 MED ORDER — DOCUSATE SODIUM 100 MG PO CAPS: 100.0000 mg | ORAL_CAPSULE | Freq: Every day | ORAL | Status: DC

## 2011-10-20 MED ORDER — GLYCOPYRROLATE 0.2 MG/ML IJ SOLN
INTRAMUSCULAR | Status: DC | PRN
  Administered 2011-10-20: 0.2 mg via INTRAVENOUS
  Administered 2011-10-20: 0.6 mg via INTRAVENOUS

## 2011-10-20 MED ORDER — ONDANSETRON HCL 4 MG/2ML IJ SOLN: 4.0000 mg | Freq: Once | INTRAMUSCULAR | Status: DC | PRN

## 2011-10-20 MED ORDER — LACTATED RINGERS IV SOLN
INTRAVENOUS | Status: DC | PRN
  Administered 2011-10-20: 08:00:00 via INTRAVENOUS

## 2011-10-20 MED ORDER — VANCOMYCIN HCL IN DEXTROSE 1-5 GM/200ML-% IV SOLN
1000.0000 mg | Freq: Two times a day (BID) | INTRAVENOUS | Status: DC
  Filled 2011-10-20 (×2): qty 200

## 2011-10-20 MED ORDER — COLLAGENASE 250 UNIT/GM EX OINT
TOPICAL_OINTMENT | Freq: Every day | CUTANEOUS | Status: DC
  Administered 2011-10-20 – 2011-10-21 (×2): via TOPICAL
  Filled 2011-10-20: qty 30

## 2011-10-20 MED ORDER — HEPARIN SODIUM (PORCINE) 1000 UNIT/ML IJ SOLN
INTRAMUSCULAR | Status: DC | PRN
  Administered 2011-10-20: 6000 [IU] via INTRAVENOUS

## 2011-10-20 MED ORDER — THROMBIN 20000 UNITS EX SOLR
CUTANEOUS | Status: AC
  Filled 2011-10-20: qty 20000

## 2011-10-20 MED ORDER — LIDOCAINE HCL (PF) 1 % IJ SOLN
INTRAMUSCULAR | Status: AC
  Filled 2011-10-20: qty 30

## 2011-10-20 MED ORDER — ISOSORBIDE MONONITRATE ER 60 MG PO TB24
120.0000 mg | ORAL_TABLET | Freq: Every day | ORAL | Status: DC
  Administered 2011-10-20 – 2011-10-21 (×2): 120 mg via ORAL
  Filled 2011-10-20 (×2): qty 2

## 2011-10-20 MED ORDER — ALUM & MAG HYDROXIDE-SIMETH 200-200-20 MG/5ML PO SUSP: 15.0000 mL | ORAL | Status: DC | PRN

## 2011-10-20 MED ORDER — TRAMADOL HCL 50 MG PO TABS
50.0000 mg | ORAL_TABLET | Freq: Four times a day (QID) | ORAL | Status: DC | PRN
  Administered 2011-10-20 – 2011-10-21 (×2): 50 mg via ORAL
  Filled 2011-10-20 (×3): qty 1

## 2011-10-20 MED ORDER — METOPROLOL TARTRATE 12.5 MG HALF TABLET
ORAL_TABLET | ORAL | Status: AC
  Filled 2011-10-20: qty 1

## 2011-10-20 MED ORDER — ASPIRIN EC 81 MG PO TBEC
81.0000 mg | DELAYED_RELEASE_TABLET | ORAL | Status: DC
  Administered 2011-10-20: 81 mg via ORAL
  Filled 2011-10-20: qty 1

## 2011-10-20 SURGICAL SUPPLY — 58 items
CANISTER SUCTION 2500CC (MISCELLANEOUS) ×2 IMPLANT
CANNULA VESSEL W/WING WO/VALVE (CANNULA) ×2 IMPLANT
CATH FOLEY LATEX FREE 14FR (CATHETERS) ×1
CATH FOLEY LF 14FR (CATHETERS) ×1 IMPLANT
CATH ROBINSON RED A/P 18FR (CATHETERS) IMPLANT
CLIP TI MEDIUM 24 (CLIP) ×2 IMPLANT
CLIP TI WIDE RED SMALL 24 (CLIP) ×2 IMPLANT
CLOTH BEACON ORANGE TIMEOUT ST (SAFETY) ×2 IMPLANT
COVER SURGICAL LIGHT HANDLE (MISCELLANEOUS) ×2 IMPLANT
CRADLE DONUT ADULT HEAD (MISCELLANEOUS) ×2 IMPLANT
DECANTER SPIKE VIAL GLASS SM (MISCELLANEOUS) IMPLANT
DERMABOND ADHESIVE PROPEN (GAUZE/BANDAGES/DRESSINGS) ×1
DERMABOND ADVANCED (GAUZE/BANDAGES/DRESSINGS) ×1
DERMABOND ADVANCED .7 DNX12 (GAUZE/BANDAGES/DRESSINGS) ×1 IMPLANT
DERMABOND ADVANCED .7 DNX6 (GAUZE/BANDAGES/DRESSINGS) ×1 IMPLANT
DRAIN HEMOVAC 1/8 X 5 (WOUND CARE) IMPLANT
DRAPE INCISE 23X17 IOBAN STRL (DRAPES) ×1
DRAPE INCISE IOBAN 23X17 STRL (DRAPES) ×1 IMPLANT
DRAPE WARM FLUID 44X44 (DRAPE) ×2 IMPLANT
ELECT REM PT RETURN 9FT ADLT (ELECTROSURGICAL) ×2
ELECTRODE REM PT RTRN 9FT ADLT (ELECTROSURGICAL) ×1 IMPLANT
EVACUATOR SILICONE 100CC (DRAIN) IMPLANT
GEL ULTRASOUND 20GR AQUASONIC (MISCELLANEOUS) IMPLANT
GLOVE BIO SURGEON STRL SZ7.5 (GLOVE) IMPLANT
GLOVE BIOGEL PI IND STRL 6.5 (GLOVE) ×1 IMPLANT
GLOVE BIOGEL PI IND STRL 7.0 (GLOVE) ×3 IMPLANT
GLOVE BIOGEL PI IND STRL 7.5 (GLOVE) ×2 IMPLANT
GLOVE BIOGEL PI INDICATOR 6.5 (GLOVE) ×1
GLOVE BIOGEL PI INDICATOR 7.0 (GLOVE) ×3
GLOVE BIOGEL PI INDICATOR 7.5 (GLOVE) ×2
GLOVE SURG SS PI 6.5 STRL IVOR (GLOVE) ×4 IMPLANT
GLOVE SURG SS PI 7.5 STRL IVOR (GLOVE) ×6 IMPLANT
GOWN PREVENTION PLUS XLARGE (GOWN DISPOSABLE) ×6 IMPLANT
GOWN STRL NON-REIN LRG LVL3 (GOWN DISPOSABLE) ×4 IMPLANT
KIT BASIN OR (CUSTOM PROCEDURE TRAY) ×2 IMPLANT
KIT ROOM TURNOVER OR (KITS) ×2 IMPLANT
KIT SUCTION CATH 14FR (SUCTIONS) ×2 IMPLANT
LOOP VESSEL MINI RED (MISCELLANEOUS) IMPLANT
NEEDLE HYPO 25GX1X1/2 BEV (NEEDLE) IMPLANT
NS IRRIG 1000ML POUR BTL (IV SOLUTION) ×4 IMPLANT
PACK CAROTID (CUSTOM PROCEDURE TRAY) ×2 IMPLANT
PAD ARMBOARD 7.5X6 YLW CONV (MISCELLANEOUS) ×4 IMPLANT
PATCH HEMASHIELD 8X75 (Vascular Products) ×2 IMPLANT
SHUNT CAROTID BYPASS 10 (VASCULAR PRODUCTS) IMPLANT
SHUNT CAROTID BYPASS 12FRX15.5 (VASCULAR PRODUCTS) IMPLANT
SPECIMEN JAR SMALL (MISCELLANEOUS) ×2 IMPLANT
SPONGE SURGIFOAM ABS GEL 100 (HEMOSTASIS) IMPLANT
SUT ETHILON 3 0 PS 1 (SUTURE) IMPLANT
SUT PROLENE 6 0 CC (SUTURE) ×2 IMPLANT
SUT PROLENE 7 0 BV 1 (SUTURE) IMPLANT
SUT VIC AB 3-0 SH 27 (SUTURE) ×1
SUT VIC AB 3-0 SH 27X BRD (SUTURE) ×1 IMPLANT
SUT VICRYL 4-0 PS2 18IN ABS (SUTURE) ×2 IMPLANT
SYR CONTROL 10ML LL (SYRINGE) IMPLANT
TOWEL OR 17X24 6PK STRL BLUE (TOWEL DISPOSABLE) ×4 IMPLANT
TOWEL OR 17X26 10 PK STRL BLUE (TOWEL DISPOSABLE) ×2 IMPLANT
TRAY FOLEY CATH 14FRSI W/METER (CATHETERS) IMPLANT
WATER STERILE IRR 1000ML POUR (IV SOLUTION) ×2 IMPLANT

## 2011-10-20 NOTE — Preoperative (Signed)
Beta Blockers   Reason not to administer Beta Blockers:Not Applicable, took at [phone]

## 2011-10-20 NOTE — Op Note (Signed)
Procedure Right carotid endarterectomy  Preoperative diagnosis: High-grade asymptomatic right internal carotid artery stenosis  Postoperative diagnosis: Same  Anesthesia General  Asst.: Della Goo, [facility]  Operative findings: #1 greater than 80% right internal carotid artery stenosis with calcified plaque                                #2 10 French shunt                                #3 Dacron patch  Operative details: After obtaining informed consent, the patient was taken to the operating room. The patient was placed in a supine position on the operating room table. After induction of general anesthesia and endotracheal intubation a Foley catheter was placed. Next the patient's entire neck and chest was prepped and draped in the usual sterile fashion. An oblique incision was made on the right aspect of the patient's neck anterior to the border the right sternocleidomastoid muscle. The incision was carried into the subcutaneous tissues and through the platysma. The sternocleidomastoid muscle was identified and reflected laterally. The common carotid artery was then found at the base of the incision this was dissected free circumferentially. The vagus nerve was identified and protected. Dissection was then carried up to the level carotid bifurcation. The Ansa cervicalis was identified and traced up to its insertion into the hypoglossal nerve. The hyperglossal nerve was identified and protected. The internal carotid artery was dissected free circumferentially just below the level of the hypoglossal nerve and it was soft in character at this location and above any palpable disease. A vessel loop was placed around this. Next the external carotid and superior thyroid arteries were dissected free circumferentially and vessel loops were placed around these. The patient was given 6000 units of intravenous heparin. After 2 minutes of circulation time and raising the mean arterial pressure to 85 mm mercury,  the distal internal carotid artery was controlled with small bulldog clamp. The external carotid and superior thyroid arteries were controlled with vessel loops. The common carotid artery was controlled with a peripheral DeBakey clamp. A longitudinal opening was made in the common carotid artery just below the bifurcation. The arteriotomy was extended distally up into the internal carotid with Potts scissors. There was a large calcified plaque with greater than 80% stenosis at the carotid bifurcation. The arteriotomy was extended past the level of stenosis. Next a 10 [place] shunt was brought in the operative field was threaded into the distal internal carotid artery and allowed to backbleed thoroughly. There was brisk backbleeding from the internal carotid artery around the shunt requiring a distal Javid shunt clamp.  This shunt was then threaded into the common carotid artery and secured with a Rummel tourniquet. The shunt was inspected and found to be free of air. Flow was then restored to the brain after approximately 6 minutes of ischemia time.  Attention was then turned to the common carotid artery once again. A suitable endarterectomy plane was obtained and endarterectomy was begun in the common carotid artery and a good proximal endpoint was obtained. An eversion endarterectomy was performed on the external carotid artery and a good endpoint was obtained. The plaque was then elevated in the internal carotid artery and a nice feathered distal endpoint was also obtained. The plaque was passed off the table as a specimen. All loose debris was then  removed from the carotid bed and everything was thoroughly irrigated with heparinized saline. A Dacron patch was then brought on to the operative field and this was sewn on as a patch angioplasty using a running 6-0 Prolene suture. Prior to completion of the anastomosis the shunt was reoccluded and brought down out of the distal internal carotid artery. The internal  carotid artery was thoroughly backbled. This was then controlled again with small bulldog clamp. The shunt was then removed from the proximal common carotid artery and this is again secured with a peripheral DeBakey clamp after forward bleeding. The external carotid was also thoroughly backbled.  The remainder of the patch was completed and the anastomosis was secured. Flow was then restored first retrograde from the external carotid into the carotid bed then antegrade from the common carotid to the external carotid artery and after approximately 5 cardiac cycles to the internal carotid artery. Doppler was used to evaluate the external/internal and common carotid arteries and these all had good Doppler flow. Hemostasis was obtained with thrombin and gelfoam.   At conclusion of the case the thrombin and gelfoam were removed from the neck incision.  The platysma muscle was reapproximated using a running 3-0 Vicryl suture. The skin was closed with 4 0 Vicryl subcuticular stitch. The patient was awakened in the operating room and was moving upper and lower extremities symmetrically and following commands.  The patient was stable on arrival to the PACU.  Fabienne Bruns, MD [facility] [place] Office: [phone] Pager: [phone]

## 2011-10-20 NOTE — Progress Notes (Signed)
Pharmacy: Antibiotic Dose Adjustment for Renal Function  OBJECTIVE:  SCr 0.82 (pre-op labs on 10/13/11) Wt: 61.6 kg Estimated CrCl~55-65 ml/min  ASSESSMENT:  78 y.o. F s/p R carotid endarterectomy on 10/20/11 and to receive 2 doses of Vancomycin for post-op coverage. The patient received a dose of Vancomycin 1g around 0800 this morning. Given the patient's weight and reduced renal function, will reduce post-op doses slightly.  PLAN:  1. Reduce Vancomycin post-op doses to 500 mg IV every 12 hours x 2 doses (starting at 2000 on 10/20/11 ~12 hours after pre-op dose given) 2. Will continue to follow for any additional antibiotic adjustments  Georgina Pillion, PharmD, BCPS Clinical Pharmacist Pager: [phone] 10/20/2011 5:30 PM

## 2011-10-20 NOTE — OR Nursing (Signed)
Patient displayed equal bilateral strength in hands and feet, tongue midline; pre-op and post-op. 

## 2011-10-20 NOTE — Interval H&P Note (Signed)
History and Physical Interval Note:  10/20/2011 7:40 AM  Julia Greene  has presented today for surgery, with the diagnosis of Right Internal Carotid Artery Stenosis  The various methods of treatment have been discussed with the patient and family. After consideration of risks, benefits and other options for treatment, the patient has consented to  Procedure(s) (LRB) with comments: ENDARTERECTOMY CAROTID (Right) as a surgical intervention .  The patient's history has been reviewed, patient examined, no change in status, stable for surgery.  I have reviewed the patient's chart and labs.  Questions were answered to the patient's satisfaction.     FIELDS,CHARLES E

## 2011-10-20 NOTE — Anesthesia Preprocedure Evaluation (Addendum)
Anesthesia Evaluation  Patient identified by MRN, date of birth, ID band Patient awake    Reviewed: Allergy & Precautions, H&P , NPO status , reviewed documented beta blocker date and time   Airway Mallampati: II      Dental  (+) Edentulous Upper and Edentulous Lower   Pulmonary  breath sounds clear to auscultation        Cardiovascular Rhythm:Regular Rate:Normal     Neuro/Psych    GI/Hepatic   Endo/Other    Renal/GU      Musculoskeletal   Abdominal   Peds  Hematology   Anesthesia Other Findings   Reproductive/Obstetrics                          Anesthesia Physical Anesthesia Plan  ASA: III  Anesthesia Plan: General   Post-op Pain Management:    Induction: Intravenous  Airway Management Planned: Oral ETT  Additional Equipment: Arterial line  Intra-op Plan:   Post-operative Plan: Extubation in OR  Informed Consent: I have reviewed the patients History and Physical, chart, labs and discussed the procedure including the risks, benefits and alternatives for the proposed anesthesia with the patient or authorized representative who has indicated his/her understanding and acceptance.     Plan Discussed with: CRNA  Anesthesia Plan Comments: (CAD s/p NSTEMI 10/12 while suffering sepsis from diverticulitis anatomy not amenable to intervention htn H/o Afib now in SR )        Anesthesia Quick Evaluation

## 2011-10-20 NOTE — Anesthesia Postprocedure Evaluation (Signed)
  Anesthesia Post-op Note  Patient: Julia Greene  Procedure(s) Performed: Procedure(s) (LRB) with comments: ENDARTERECTOMY CAROTID (Right) - Right Carotid Endarterectomy with patch angioplasty  Patient Location: PACU  Anesthesia Type: General  Level of Consciousness: awake, alert  and oriented  Airway and Oxygen Therapy: Patient Spontanous Breathing and Patient connected to nasal cannula oxygen  Post-op Pain: none  Post-op Assessment: Post-op Vital signs reviewed and Patient's Cardiovascular Status Stable  Post-op Vital Signs: stable  Complications: No apparent anesthesia complications

## 2011-10-20 NOTE — H&P (View-Only) (Signed)
[facility] [place] HISTORY AND PHYSICAL   History of Present Illness:  Patient is a 78 y.o. year old female who presents for evaluation of a known high-grade right carotid stenosis and moderate left carotid stenosis.  The patient was offered carotid endarterectomy on her previous office visit however she refused. She returns today for further followup. She is followed by Dr. Hochrein for cardiology issues and is thought to be moderate risk. She had a recent hospital admission with angina. She was reevaluated by Dr. Hochrein at that time. She denies any symptoms of TIA amaurosis or stroke. Both of her sons are present for the office visit today.   In addition she has now developed an ulceration on her right foot. Her primary care physician thought this might be related to peripheral arterial disease. She is here for evaluation of this today as well. She denies claudication. However she is minimally ambulatory. She is currently applying collagenase ointment to the ulcers on her right foot. She does not know how they occurred.  Other medical problems include hyperlipidemia, hypertension, coronary disease and borderline diabetes. These are all currently controlled.  Past Medical History  Diagnosis Date  . Coronary artery disease     h/o NSTEMI in setting of [place]ib/sepsis ([place]  LHC 04/06/11:  dLM 40%, oLAD 70%, then long 50%, then occluded, oDx 95%, p-mDx 60%, AVCFX occluded, oOM 90% with inf branch occluded, ext network of occluded OMs and PLs (fill s/w by collats), p+mRI 60%,, pRCA 40-50%, oAM 80%, EF 55-65%   . Atrial fibrillation     in setting of sepsis in [place]; tx with amiodarone (amio stopped 04/20/11);  Echo 02/26/11: Septal and apical hypokinesis, moderate LVE, EF 45-50%, posterior MAC with bileaflet mitral valve prolapse, posteriorly directed eccentric MR with moderate MR, mild BAE.  . Diverticulitis of colon (without mention of hemorrhage)   . Injury to liver   . Allergic  rhinitis due to pollen   . Hyperlipidemia   . Hypertension   . Bone/cartilage disorder   . Vitamin d deficiency   . Anemia, unspecified   . Arthritis   . Diabetes mellitus     borderline  . Diverticulitis of colon with perforation s/p colectomy/ostomy 13Oct2012 02/23/2011  . Carotid stenosis     02/2011 - RICA 80-99% and LICA 60-79%  . NSTEMI (non-ST elevated myocardial infarction) 10/2010    Echo 10/2010 (at [facility] in [place]):  mid ot apical septal AK, EF 40-45%, MAC, mild to mod MR, mild TR, no pulmo HTN    Past Surgical History  Procedure Date  . Appendectomy   . Total hip arthroplasty 07/2004    Right  . Colostomy 10/2010  . Cesarean section 1955, 1959, 1960  . Abdominal hysterectomy 1976  . Bladder repair 1976  . Abdominal adhesion surgery     at time of hysterectomy  . Joint replacement 2006    Right  Hip     Social History History  Substance Use Topics  . Smoking status: Never Smoker   . Smokeless tobacco: Never Used  . Alcohol Use: No    Family History Family History  Problem Relation Age of Onset  . Heart disease Mother 75    Pacemaker, No CAD Heart Disease before age 60  . Diverticulitis Mother   . Diabetes Mother   . Hyperlipidemia Mother   . Hypertension Mother   . Lung disease Father 78    Smoking  . Ovarian cancer Daughter   .   Cancer Daughter   . Colon cancer Neg Hx   . Heart disease Maternal Grandmother   . Diabetes Maternal Aunt     x 2  . Diabetes Sister   . Heart disease Sister     Heart Disease before age 60  . Hyperlipidemia Sister   . Hypertension Sister     Allergies  Allergies  Allergen Reactions  . Demerol Other (See Comments)    coma  . Benzoin     coma  . Cephalexin     Turns red  . Cephalosporins     coma  . Contrast Media (Iodinated Diagnostic Agents)     Passed out  . Dilaudid (Hydromorphone Hcl)     coma  . Insulins Swelling  . Lisinopril     cough  . Morphine And Related     coma  . Other      Patient highly sensitive to antibiotics, narcotics and any medication used to put patient to sleep for surgery.  . Penicillins     coma  . Sulfa Antibiotics     coma  . Aspirin     coma  . Latex Rash     Current Outpatient Prescriptions  Medication Sig Dispense Refill  . acetaminophen-codeine (TYLENOL #3) 300-30 MG per tablet Take 1 tablet by mouth every 4 (four) hours as needed. For pain      . aspirin EC 81 MG tablet Take 1 tablet (81 mg total) by mouth every other day.      . atorvastatin (LIPITOR) 40 MG tablet Take 1 tablet (40 mg total) by mouth daily.  30 tablet  6  . Calcium Carbonate-Vitamin D (CALCIUM 600 + D PO) Take 1 tablet by mouth 2 (two) times daily.      . collagenase (SANTYL) ointment Apply topically daily. Apply to inner and outer right foot wound daily.  Cover with moist dressgin.  15 g  1  . DiphenhydrAMINE HCl (BENADRYL PO) Take 30 mLs by mouth as needed. For cough      . isosorbide mononitrate (IMDUR) 60 MG 24 hr tablet Take 1.5 tablets (90 mg total) by mouth daily.  135 tablet  3  . Melatonin 5 MG CAPS Take 1 capsule by mouth at bedtime as needed. To help sleep      . Methylsulfonylmethane (MSM) 1000 MG TABS Take 1 tablet by mouth daily.      . metoprolol tartrate (LOPRESSOR) 25 MG tablet Take 12.5 mg by mouth 2 (two) times daily. Take 1/2 tablet by mouth twice daily      . Multiple Vitamins-Minerals (ADULT GUMMY PO) Take 2 tablets by mouth daily.      . nitroGLYCERIN (NITROSTAT) 0.4 MG SL tablet Place 1 tablet (0.4 mg total) under the tongue every 5 (five) minutes x 3 doses as needed. For chest pain  25 tablet  3  . omeprazole (PRILOSEC) 10 MG capsule Take 10 mg by mouth daily.      . DISCONTD: amiodarone (PACERONE) 200 MG tablet Take 200 mg by mouth daily.        ROS:   General:  No weight loss, Fever, chills  HEENT: No recent headaches, no nasal bleeding, no visual changes, no sore throat  Neurologic: No dizziness, blackouts, seizures. No recent symptoms  of stroke or mini- stroke. No recent episodes of slurred speech, or temporary blindness.  Cardiac: + recent episodes of chest pain/pressure, no shortness of breath at rest.  No shortness of breath with exertion    Vascular: No history of rest pain in feet.  No history of claudication.  No history of non-healing ulcer, No history of DVT   Pulmonary: No home oxygen, no productive cough, no hemoptysis,  No asthma or wheezing  Musculoskeletal:  [ ] Arthritis, [ ] Low back pain,  [ ] Joint pain  Hematologic:No history of hypercoagulable state.  No history of easy bleeding.  No history of anemia  Gastrointestinal: No hematochezia or melena,  No gastroesophageal reflux, no trouble swallowing  Urinary: [ ] chronic Kidney disease, [ ] on HD - [ ] MWF or [ ] TTHS, [ ] Burning with urination, [ ] Frequent urination, [ ] Difficulty urinating;   Skin: No rashes  Psychological: No history of anxiety,  No history of depression   Physical Examination  Filed Vitals:   10/08/11 1238 10/08/11 1242  BP: 151/80 152/48  Pulse: 61 46  Resp: 16   Height: 5' 2" (1.575 m)   Weight: 131 lb 4.8 oz (59.557 kg)   SpO2: 99% 96%    Body mass index is 24.01 kg/(m^2).  General:  Alert and oriented, no acute distress HEENT: Normal Neck: Harsh right-sided bruit no JVD Pulmonary: Clear to auscultation bilaterally Cardiac: Regular Rate and Rhythm without murmur Abdomen: Soft, non-tender, non-distended, no mass, colostomy Skin: No rash, right lateral fifth toe 1 cm ulceration with clean base right dorsal foot with 1 cm open ulceration clean base Extremity Pulses:  2+ radial, brachial, femoral, absent dorsalis pedis, posterior tibial pulses bilaterally Musculoskeletal: No deformity or edema  Neurologic: Upper and lower extremity motor 5/5 and symmetric  DATA: The patient had a bilateral carotid duplex ultrasound today as well as bilateral ABIs. I reviewed and interpreted both studies. The carotid duplex shows a  greater than 80% right internal carotid artery stenosis and a 40-60% left internal carotid artery stenosis there is antegrade vertebral flow bilaterally. ABI on the right was 0.55 left was 0.93 toe pressure on the right was 41 left was 76  ASSESSMENT: #1 high-grade asymptomatic right internal carotid artery stenosis. At this point the patient has consented a carotid endarterectomy. Risks benefits possible complications and procedure details were explained the patient and her 2 sons today. These include but are not limited to bleeding infection stroke risk of 1-2% myocardial risk as previously described by Dr. Hochrein, cranial nerve injury 10-15%.  Right carotid endarterectomy scheduled for September 24  #2 peripheral arterial disease with nonhealing ulcer right foot she most likely has superficial femoral artery occlusive disease we will do further evaluation of this after the carotid issue has been addressed continue local wound care   PLAN:  See above  Jamile Sivils, MD [facility] [place] Office: [phone] Pager: [phone]  

## 2011-10-20 NOTE — Progress Notes (Signed)
Cuff BP is high, then low, but Arterial BP is stable.  Spoke with dr. Noreene Larsson, told to treat via the art. bp

## 2011-10-20 NOTE — Progress Notes (Signed)
Pt awake in PACU, sore throat Follows commands UE/LE 5/5 motor Tongue midline  Stable in PACU To 3300 when bed available Probable d/c am  Fabienne Bruns, MD [facility] [place] Office: [phone] Pager: [phone]

## 2011-10-20 NOTE — Transfer of Care (Signed)
Immediate Anesthesia Transfer of Care Note  Patient: Julia Greene  Procedure(s) Performed: Procedure(s) (LRB) with comments: ENDARTERECTOMY CAROTID (Right) - Right Carotid Endarterectomy with patch angioplasty  Patient Location: PACU  Anesthesia Type: General  Level of Consciousness: awake, oriented and patient cooperative  Airway & Oxygen Therapy: Patient Spontanous Breathing and Patient connected to face mask oxygen  Post-op Assessment: Report given to PACU RN and Post -op Vital signs reviewed and stable  Post vital signs: Reviewed and stable  Complications: No apparent anesthesia complications

## 2011-10-20 NOTE — OR Nursing (Signed)
Pre-procedure timeout completed at 0745, not 0845

## 2011-10-21 ENCOUNTER — Encounter (HOSPITAL_COMMUNITY): Payer: Self-pay | Admitting: Vascular Surgery

## 2011-10-21 ENCOUNTER — Telehealth: Payer: Self-pay | Admitting: Vascular Surgery

## 2011-10-21 LAB — TYPE AND SCREEN
ABO/RH(D): O POS
Antibody Screen: NEGATIVE
Unit division: 0
Unit division: 0

## 2011-10-21 LAB — BASIC METABOLIC PANEL
Chloride: 101 mEq/L (ref 96–112)
GFR calc Af Amer: >90 mL/min (ref >90)
GFR calc non Af Amer: 78 mL/min — ABNORMAL LOW (ref >90)
Potassium: 4.8 mEq/L (ref 3.5–5.1)
Sodium: 135 mEq/L (ref 135–145)

## 2011-10-21 LAB — CBC
Hemoglobin: 8.6 g/dL — ABNORMAL LOW (ref 12.0–15.0)
MCHC: 34 g/dL (ref 30.0–36.0)
RDW: 13.4 % (ref 11.5–15.5)
WBC: 7.9 10*3/uL (ref 4.0–10.5)

## 2011-10-21 MED ORDER — ACETAMINOPHEN-CODEINE #3 300-30 MG PO TABS: 1.0000 | ORAL_TABLET | ORAL | Status: DC | PRN

## 2011-10-21 NOTE — Discharge Summary (Signed)
[facility] Discharge Summary   Patient ID:  Julia Greene MRN: [id number] DOB/AGE: 1930/10/01 78 y.o.  Admit date: 10/20/2011 Discharge date: 10/21/2011 Date of Surgery: 10/20/2011 Surgeon: Surgeon(s): Sherren Kerns, MD  Admission Diagnosis: Right Internal Carotid Artery Stenosis  Discharge Diagnoses:  Right Internal Carotid Artery Stenosis  Secondary Diagnoses: Past Medical History  Diagnosis Date  . Coronary artery disease     h/o NSTEMI in setting of AFib/se[place] ([place]  LHC 04/06/11:  dLM 40%, oLAD 70%, then long 50%, then occluded, oDx 95%, p-mDx 60%, AVCFX occluded, oOM 90% with inf branch occluded, ext network of occluded OMs and PLs (fill s/w by collats), p+mRI 60%,, pRCA 40-50%, oAM 80%, EF 55-65%   . Atrial fibrillation     in setting of sepsis in [place]; tx with amiodarone (amio stopped 04/20/11);  Echo 02/26/11: Septal and apical hypokinesis, moderate LVE, EF 45-50%, posterior MAC with bileaflet mitral valve prolapse, posteriorly directed eccentric MR with moderate MR, mild BAE.  . Diverticulitis of colon (without mention of hemorrhage)   . Injury to liver   . Allergic rhinitis due to pollen   . Hyperlipidemia   . Hypertension   . Bone/cartilage disorder   . Vitamin d deficiency   . Anemia, unspecified   . Arthritis   . Diabetes mellitus     borderline  . Diverticulitis of colon with perforation s/p colectomy/ostomy 13Oct2012 02/23/2011  . Carotid stenosis     02/2011 - RICA 80-99% and LICA 60-79%  . NSTEMI (non-ST elevated myocardial infarction) 10/2010    Echo 10/2010 (at [facility] in [facility]):  mid ot apical septal AK, EF 40-45%, MAC, mild to mod MR, mild TR, no pulmo HTN  . Complication of anesthesia     Allergie to narcotics and abx.    Procedure(s): ENDARTERECTOMY CAROTID  Discharged Condition: good  HPI:  Patient is a 78 y.o. year old female who presents for evaluation of a known high-grade right carotid stenosis and moderate left  carotid stenosis. The patient was offered carotid endarterectomy on her previous office visit however she refused. She returns today for further followup. She is followed by Dr. Antoine Poche for cardiology issues and is thought to be moderate risk. She had a recent hospital admission with angina. She was reevaluated by Dr. Antoine Poche at that time. She denies any symptoms of TIA amaurosis or stroke. Both of her sons are present for the office visit today. She is admitted for Right carotid endarterectomy  Hospital Course:  Julia Greene is a 78 y.o. female is S/P Right Procedure(s): ENDARTERECTOMY CAROTID Extubated: POD # 0 Post-op wounds healing well Pt. Ambulating, voiding and taking PO diet without difficulty. Pt pain controlled with PO pain meds. Labs as below Complications:none  Consults:     Significant Diagnostic Studies: CBC Lab Results  Component Value Date   WBC 7.9 10/21/2011   HGB 8.6* 10/21/2011   HCT 25.3* 10/21/2011   MCV 91.3 10/21/2011   PLT 237 10/21/2011    BMET    Component Value Date/Time   NA 135 10/21/2011 0420   K 4.8 10/21/2011 0420   CL 101 10/21/2011 0420   CO2 25 10/21/2011 0420   GLUCOSE 136* 10/21/2011 0420   BUN 14 10/21/2011 0420   CREATININE 0.76 10/21/2011 0420   CREATININE 1.17* 03/27/2011 1603   CALCIUM 9.1 10/21/2011 0420   GFRNONAA 78* 10/21/2011 0420   GFRAA >90 10/21/2011 0420   COAG Lab Results  Component Value Date  INR 1.07 10/13/2011   INR 1.02 09/30/2011   INR 1.01 03/27/2011     Disposition:  Discharge to :Home Discharge Orders    Future Appointments: Provider: Department: Dept Phone: Center:   11/17/2011 10:10 AM Beatrice Lecher, PA [facility] [phone] [facility]     Future Orders Please Complete By Expires   Resume previous diet      Driving Restrictions      Comments:   No driving for 2 weeks   Lifting restrictions      Comments:   No lifting for 4 weeks   Call MD for:  temperature >100.5      Call MD for:  redness,  tenderness, or signs of infection (pain, swelling, bleeding, redness, odor or green/yellow discharge around incision site)      Call MD for:  severe or increased pain, loss or decreased feeling  in affected limb(s)      Increase activity slowly      Comments:   Walk with assistance use walker or cane as needed   May shower       Scheduling Instructions:   Thursday   No dressing needed      may wash over wound with mild soap and water      CAROTID [place]: Call MD for difficulty swallowing or speaking; weakness in arms or legs that is a new symtom; severe headache.  If you have increased swelling in the neck and/or  are having difficulty breathing, CALL 911         Julia Greene, Julia Greene  Home Medication Instructions [id number]   Printed on:10/21/11 5621  Medication Information                    metoprolol tartrate (LOPRESSOR) 25 MG tablet Take 12.5 mg by mouth 2 (two) times daily.            aspirin EC 81 MG tablet Take 1 tablet (81 mg total) by mouth every other day.           omeprazole (PRILOSEC) 10 MG capsule Take 10 mg by mouth daily.           acetaminophen-codeine (TYLENOL #3) 300-30 MG per tablet Take 1 tablet by mouth every 4 (four) hours as needed. For pain           Calcium Carbonate-Vitamin D (CALCIUM 600 + D PO) Take 1 tablet by mouth 2 (two) times daily.           Melatonin 5 MG CAPS Take 5 mg by mouth at bedtime as needed. To help sleep           Multiple Vitamins-Minerals (ADULT GUMMY PO) Take 2 tablets by mouth daily.           Methylsulfonylmethane (MSM) 1000 MG TABS Take 1 tablet by mouth daily.           DiphenhydrAMINE HCl (BENADRYL PO) Take 30 mLs by mouth as needed. For cough           atorvastatin (LIPITOR) 40 MG tablet Take 1 tablet (40 mg total) by mouth daily.           nitroGLYCERIN (NITROSTAT) 0.4 MG SL tablet Place 1 tablet (0.4 mg total) under the tongue every 5 (five) minutes x 3 doses as needed. For chest pain           isosorbide  mononitrate (IMDUR) 120 MG 24 hr tablet Take 1 tablet (120 mg total) by mouth  daily.           Tylenol #3 take 1 tab every 4 hours as needed for pain #30 given in RX           Verbal and written Discharge instructions given to the patient. Wound care per Discharge AVS Follow-up Information    Follow up with Fabienne Bruns E, MD. In 2 weeks. (office will arrange -sent)    Contact information:   [address] [place] [address] [phone]          Signed: Marlowe Shores 10/21/2011, 7:28 AM

## 2011-10-21 NOTE — Progress Notes (Addendum)
VASCULAR AND VEIN SURGERY POST - OP [facility] PROGRESS NOTE  Date of Surgery: 10/20/2011 Surgeon: Surgeon(s): Sherren Kerns, MD 1 Day Post-Op right Carotid Endarterectomy .  HPI: Julia Greene is a 78 y.o. female who is 1 Day Post-Op right Carotid Endarterectomy . Patient is doing well.  Patient denies headache; Patient denies difficulty swallowing; denies weakness in upper or lower extremities; Pt. denies other symptoms of stroke or TIA.  IMAGING: No results found.  Significant Diagnostic Studies: CBC Lab Results  Component Value Date   WBC 7.9 10/21/2011   HGB 8.6* 10/21/2011   HCT 25.3* 10/21/2011   MCV 91.3 10/21/2011   PLT 237 10/21/2011    BMET    Component Value Date/Time   NA 135 10/21/2011 0420   K 4.8 10/21/2011 0420   CL 101 10/21/2011 0420   CO2 25 10/21/2011 0420   GLUCOSE 136* 10/21/2011 0420   BUN 14 10/21/2011 0420   CREATININE 0.76 10/21/2011 0420   CREATININE 1.17* 03/27/2011 1603   CALCIUM 9.1 10/21/2011 0420   GFRNONAA 78* 10/21/2011 0420   GFRAA >90 10/21/2011 0420    COAG Lab Results  Component Value Date   INR 1.07 10/13/2011   INR 1.02 09/30/2011   INR 1.01 03/27/2011   No results found for this basename: PTT      Intake/Output Summary (Last 24 hours) at 10/21/11 0727 Last data filed at 10/21/11 0410  Gross per 24 hour  Intake   1240 ml  Output   1650 ml  Net   -410 ml    Physical Exam:  BP Readings from Last 3 Encounters:  10/21/11 126/46  10/21/11 126/46  10/13/11 117/62   Temp Readings from Last 3 Encounters:  10/21/11 98.6 F (37 C) Oral  10/21/11 98.6 F (37 C) Oral  10/13/11 98.2 F (36.8 C)    SpO2 Readings from Last 3 Encounters:  10/21/11 97%  10/21/11 97%  10/13/11 96%   Pulse Readings from Last 3 Encounters:  10/21/11 73  10/21/11 73  10/13/11 63    Pt is A&O x 3 Gait is normal Speech is fluent right Neck Wound is healing well Patient with Negative tongue deviation and Negative facial droop Pt has good and equal  strength in all extremities  Assessment: Julia Greene is a 78 y.o. female is S/P Right Carotid endarterectomy Pt is voiding, ambulating and taking po well  chronic anemia stable with Hgb 8.6 (9.0) pre-op   Plan: Discharge to: Home Follow-up in 2 weeks  Pt states she can take Tylenol #3 with benadryl and can tolerate despite Allergy to Tomi Bamberger [phone] 10/21/2011 7:27 AM   History and exam as above Neuro intact, no hematoma  D/c home  Fabienne Bruns, MD [facility] [place] Office: [phone] Pager: [phone]

## 2011-10-21 NOTE — Telephone Encounter (Signed)
Message copied by Margaretmary Eddy on Wed Oct 21, 2011  9:44 AM ------      Message from: Melene Plan      Created: Tue Oct 20, 2011  2:53 PM                   ----- Message -----         From: Marlowe Shores, PA         Sent: 10/20/2011  11:00 AM           To: Melene Plan, RN            2 week F/U  [facility]

## 2011-10-21 NOTE — Progress Notes (Signed)
Discharge instructions given to patient with readback. Also, nods head for understanding of all orders.  Patient awaiting family to transport home.  Continue to watch

## 2011-10-21 NOTE — Progress Notes (Signed)
Family in to transport patient. No complaints, pt. Getting dressed.  NSL dc'd intact.  Site unremarkable. No questions or concerns from pt or family.

## 2011-10-21 NOTE — Progress Notes (Signed)
Utilization review completed.  

## 2011-11-04 ENCOUNTER — Encounter: Payer: Self-pay | Admitting: Vascular Surgery

## 2011-11-05 ENCOUNTER — Encounter: Payer: Self-pay | Admitting: Vascular Surgery

## 2011-11-05 ENCOUNTER — Ambulatory Visit (INDEPENDENT_AMBULATORY_CARE_PROVIDER_SITE_OTHER): Payer: Medicare Other | Admitting: Vascular Surgery

## 2011-11-05 VITALS — BP 147/51 | HR 64 | Temp 98.0°F | Ht 62.0 in | Wt 131.0 lb

## 2011-11-05 DIAGNOSIS — I6529 Occlusion and stenosis of unspecified carotid artery: Secondary | ICD-10-CM

## 2011-11-05 DIAGNOSIS — I739 Peripheral vascular disease, unspecified: Secondary | ICD-10-CM

## 2011-11-05 DIAGNOSIS — Z48812 Encounter for surgical aftercare following surgery on the circulatory system: Secondary | ICD-10-CM

## 2011-11-05 DIAGNOSIS — L98499 Non-pressure chronic ulcer of skin of other sites with unspecified severity: Secondary | ICD-10-CM

## 2011-11-05 NOTE — Progress Notes (Signed)
Patient is a 78 year old female returns for postoperative followup today concerning a right carotid endarterectomy. She is currently taking one 81 mg aspirin every other day. She reports no symptoms of TIA amaurosis or stroke. She does also have a history of peripheral arterial disease. She also returns today for followup evaluation of her right foot. She was previously seen with 2 ulcerations on her right foot. She states that she thinks these are healing.  Review of systems: She denies shortness of breath. She denies chest pain.  Physical exam:  Filed Vitals:   11/05/11 1114 11/05/11 1115  BP: 164/68 147/51  Pulse: 64   Temp: 98 F (36.7 C)   TempSrc: Oral   Height: 5\' 2"  (1.575 m)   Weight: 131 lb (59.421 kg)   SpO2: 100%     Neck: 2+ carotid pulses well-healed right neck incision no carotid bruit  Chest: Clear to auscultation  Neuro: Symmetric upper extremity and lower extremity motor strength is 5 over 5  Vascular: 2+ femoral pulses with absent popliteal and pedal pulses bilaterally  Skin: 1 cm ulceration on plantar aspect of the mid foot medially which appears to be healing similar ulceration dorsal aspect right fifth toe  Assessment/plan: Doing well status post carotid endarterectomy. Patient with known PAD in the right lower extremity. Ulcerations at this point are healing. I believe continuing conservative measures with close observation and soap and water in minimizing trauma to the foot the should heal without further intervention. If the wounds on her right foot are not healed within 6 weeks she will return for further followup. Otherwise she'll return in 6 months for a repeat carotid duplex scan and bilateral ABIs. She will try to take an 81 mg aspirin daily.  Fabienne Bruns, MD [facility] [place] Office: [phone] Pager: [phone]

## 2011-11-05 NOTE — Addendum Note (Signed)
Addended by: Sharee Pimple on: 11/05/2011 12:39 PM   Modules accepted: Orders

## 2011-11-17 ENCOUNTER — Ambulatory Visit: Payer: Medicare Other | Admitting: Physician Assistant

## 2011-11-19 ENCOUNTER — Ambulatory Visit: Payer: Medicare Other | Admitting: Vascular Surgery

## 2011-11-19 ENCOUNTER — Other Ambulatory Visit: Payer: Medicare Other

## 2011-11-24 ENCOUNTER — Encounter: Payer: Self-pay | Admitting: Physician Assistant

## 2011-11-24 ENCOUNTER — Ambulatory Visit (INDEPENDENT_AMBULATORY_CARE_PROVIDER_SITE_OTHER): Payer: Medicare Other | Admitting: Physician Assistant

## 2011-11-24 VITALS — BP 149/64 | HR 60 | Ht 62.0 in | Wt 132.1 lb

## 2011-11-24 DIAGNOSIS — E785 Hyperlipidemia, unspecified: Secondary | ICD-10-CM

## 2011-11-24 DIAGNOSIS — I2510 Atherosclerotic heart disease of native coronary artery without angina pectoris: Secondary | ICD-10-CM

## 2011-11-24 DIAGNOSIS — I6529 Occlusion and stenosis of unspecified carotid artery: Secondary | ICD-10-CM | POA: Insufficient documentation

## 2011-11-24 DIAGNOSIS — I739 Peripheral vascular disease, unspecified: Secondary | ICD-10-CM

## 2011-11-24 DIAGNOSIS — I10 Essential (primary) hypertension: Secondary | ICD-10-CM

## 2011-11-24 MED ORDER — AMLODIPINE BESYLATE 5 MG PO TABS: 5.0000 mg | ORAL_TABLET | Freq: Every day | ORAL | Status: DC

## 2011-11-24 NOTE — Progress Notes (Addendum)
[address], [address] [place], [place]  [address] Phone: [phone], Fax:  [phone]  Date:  11/24/2011   Name:  Julia Greene   DOB:  [birth year]   MRN:  [id number]  PCP:  Oneal Grout, MD  Primary Cardiologist:  Dr. Rollene Rotunda  Primary Electrophysiologist:  None    History of Present Illness: Julia Greene is a 78 y.o. female who returns for follow up on CAD.  She established with Dr. Antoine Poche 01/2011 after moving here from [facility]. She had a NSTEMI in the setting of atrial fibrillation and perforated diverticulum with peritoneal abscess.  She had preserved LVF and mitral regurgitation noted.  Myoview demonstrated septal and anterior ischemia.  Cardiac cath demonstrated severe diffuse three-vessel coronary artery disease.  She had no high grade lesions amenable to PCI and surgical revascularization would be very incomplete.  Medical Rx recommended.  She was noted to have a high grade RICA stenosis.  She was admitted 9/13 with chest discomfort. Cath films were reviewed again. Medical therapy continued to be recommended and she was placed on nitrates. She was last seen by Dr. Antoine Poche 10/06/11. He increased her nitrate therapy. Patient underwent right CEA by Dr. Darrick Penna 10/20/11. Her postoperative course was uneventful.  She also follows with Dr. Darrick Penna for PAD and has been noted to have RLE ulcerations.  Notes indicate these are healing.   She is doing well.  She has infrequent chest pain.  No significant dyspnea.  No orthopnea, PND, edema.  Right foot ulcers are continuing to heal.  No syncope.    Labs (9/13):   K 4.8, creatinine 0.76, ALT 8, LDL 154, Hgb 8.6, TSH 12.011  Wt Readings from Last 3 Encounters:  11/24/11 132 lb 1.9 oz (59.929 kg)  11/05/11 131 lb (59.421 kg)  10/21/11 135 lb 12.9 oz (61.6 kg)     Past Medical History  Diagnosis Date  . Coronary artery disease     a. h/o NSTEMI in setting of AFib/se[place] ([place]  b.  MV 2/13: EF 57%, mod isch septum and  poss small part of the Ant wall;  c.  LHC 04/06/11:  dLM 40%, oLAD 70%, then long 50%, then occluded, oDx 95%, p-mDx 60%, AVCFX occluded, oOM 90% with inf branch occluded, ext network of occluded OMs and PLs (fill s/w by collats), p+mRI 60%,, pRCA 40-50%, oAM 80%, EF 55-65%   . Atrial fibrillation     in setting of sepsis in [place]; tx with amiodarone (amio stopped 04/20/11);    Marland Kitchen Injury to liver   . Allergic rhinitis due to pollen   . Hyperlipidemia   . Hypertension   . Vitamin D deficiency   . Anemia, unspecified   . Arthritis   . Diabetes mellitus     borderline  . Diverticulitis of colon with perforation s/p colectomy/ostomy 13Oct2012 02/23/2011  . Carotid stenosis     a.  02/2011 - RICA 80-99% and LICA 60-79%;  b. s/p R CEA Dr. Darrick Penna 09/2011  . Complication of anesthesia     Allergie to narcotics and abx.  Marland Kitchen Hx of echocardiogram     a.  Echo 10/2010 (in setting of NSTEMI at [facility] in [facility]):  mid ot apical septal AK, EF 40-45%, MAC, mild to mod MR, mild TR, no pulmo HTN;  b. Echo 02/26/11: Septal and apical hypokinesis, moderate LVE, EF 45-50%, posterior MAC with bileaflet mitral valve prolapse, posteriorly directed eccentric MR with moderate MR, mild BAE.  Marland Kitchen  PAD (peripheral artery disease)     a. ABIs 9/13: R 0.55; L 0.93;  b. arterial insufficiency ulcers on right foot;  followed by [facility]    Current Outpatient Prescriptions  Medication Sig Dispense Refill  . acetaminophen-codeine (TYLENOL #3) 300-30 MG per tablet Take 1 tablet by mouth every 4 (four) hours as needed for pain.  30 tablet  0  . aspirin EC 81 MG tablet Take 1 tablet (81 mg total) by mouth every other day.      . Calcium Carbonate-Vitamin D (CALCIUM 600 + D PO) Take 1 tablet by mouth 2 (two) times daily.      . DiphenhydrAMINE HCl (BENADRYL PO) Take 30 mLs by mouth as needed. For cough      . isosorbide mononitrate (IMDUR) 120 MG 24 hr tablet Take 1 tablet (120 mg total) by mouth daily.      . Melatonin 5 MG CAPS Take  5 mg by mouth at bedtime as needed. To help sleep      . Methylsulfonylmethane (MSM) 1000 MG TABS Take 1 tablet by mouth daily.      . metoprolol tartrate (LOPRESSOR) 25 MG tablet Take 12.5 mg by mouth 2 (two) times daily.       . Multiple Vitamins-Minerals (ADULT GUMMY PO) Take 2 tablets by mouth daily.      . nitroGLYCERIN (NITROSTAT) 0.4 MG SL tablet Place 1 tablet (0.4 mg total) under the tongue every 5 (five) minutes x 3 doses as needed. For chest pain  25 tablet  3  . DISCONTD: amiodarone (PACERONE) 200 MG tablet Take 200 mg by mouth daily.        Allergies: Allergies  Allergen Reactions  . Demerol Other (See Comments)    coma  . Codeine Swelling and Rash    Injectable. Can take by mouth.  . Benzoin     coma  . Cephalexin     Turns red  . Cephalosporins     coma  . Contrast Media (Iodinated Diagnostic Agents)     Passed out  . Dilaudid (Hydromorphone Hcl)     coma  . Insulins Swelling  . Lisinopril     cough  . Morphine And Related     coma  . Other     Patient highly sensitive to antibiotics, narcotics and any medication used to put patient to sleep for surgery.  . Penicillins     coma  . Sulfa Antibiotics     coma  . Aspirin     coma  . Latex Rash    Social History:   reports that she has never smoked. She has never used smokeless tobacco. She reports that she does not drink alcohol or use illicit drugs.   ROS:  Please see the history of present illness.   No further cough.  All other systems reviewed and negative.   PHYSICAL EXAM: VS:  BP 149/64  Pulse 60  Ht 5\' 2"  (1.575 m)  Wt 132 lb 1.9 oz (59.929 kg)  BMI 24.16 kg/m2 Well nourished, well developed, in no acute distress HEENT: normal Neck: no JVD; right CEA scar well healed Cardiac:  normal S1, S2; RRR; 2/6 systolic murmur along LLSB Lungs:  clear to auscultation bilaterally, no wheezing, rhonchi or rales Abd: soft, nontender, no hepatomegaly Ext: no edema; 2 small ulcers noted on right foot (one  in arch and one on plantar surface at the 1st MTPJ)  Skin: warm and dry Neuro:  CNs 2-12 intact, no  focal abnormalities noted  EKG:  NSR, HR 60, normal axis, no acute changes.      ASSESSMENT AND PLAN:  1. Coronary Artery Disease:   Doing well.  Angina fairly well controlled.  I will add Amlodipine as noted below.  Continue ASA.  Follow up with me in 3 mos.  2. Hypertension:   BP elevated.  Add Amlodipine 5 mg QD.  This can also help as an antianginal.    3. Hyperlipidemia:   Reports a cough with Lipitor.  She was taking an ACE at the same time and this was stopped.  Suspect the cough was related to the ACE and not the statin.  I have encouraged her to restart the Lipitor 40 mg QD.  I have also given her samples of Crestor.  If she decides not to take the Lipitor or if she cannot tolerate the Lipitor, she can try Crestor 10 mg on Mon, Wed, Fri.  If she tolerates, she will call us back so we can arrange follow up labs.  4. Carotid Stenosis:   Doing well post right CEA.  She follows with [facility].  5. Peripheral Arterial Disease:   She has arterial ulcers on her right foot.  These are healing.  She is followed by [facility].  6. Elevated TSH:   She has followed up with her PCP with repeat labs.  Luna Glasgow, PA-C  12:26 PM 11/24/2011

## 2011-11-24 NOTE — Patient Instructions (Addendum)
1. Start Amlodipine 5 mg daily.  This is added to your medications.  Do not stop anything else.  2. After a few weeks of taking Amlodipine, restart Lipitor 40 mg daily.  If you develop side effects to this, you can stop.    3. I have given you some samples of Crestor.  If you decide to not take Lipitor or if you have stop the Lipitor, try the Crestor 10 mg on Monday, Wednesday and Friday only.    4. If you tolerate the Crestor without side effects, call us so we can arrange follow up labs and send in a prescription to your pharmacy.    5. FOLLOW UP APPOINTMENT WITH SCOTT WEAVER, [facility] ON 02/24/12 @ 11:30

## 2012-01-04 ENCOUNTER — Ambulatory Visit (INDEPENDENT_AMBULATORY_CARE_PROVIDER_SITE_OTHER): Payer: Medicare Other | Admitting: *Deleted

## 2012-01-04 DIAGNOSIS — I4891 Unspecified atrial fibrillation: Secondary | ICD-10-CM

## 2012-01-04 DIAGNOSIS — I10 Essential (primary) hypertension: Secondary | ICD-10-CM

## 2012-01-04 DIAGNOSIS — I219 Acute myocardial infarction, unspecified: Secondary | ICD-10-CM

## 2012-01-04 LAB — CBC WITH DIFFERENTIAL/PLATELET
Basophils Relative: 0.6 % (ref 0.0–3.0)
Eosinophils Relative: 1.8 % (ref 0.0–5.0)
Hemoglobin: 9.9 g/dL — ABNORMAL LOW (ref 12.0–15.0)
Lymphocytes Relative: 40.7 % (ref 12.0–46.0)
Monocytes Relative: 10.8 % (ref 3.0–12.0)
Neutro Abs: 3.9 10*3/uL (ref 1.4–7.7)
Neutrophils Relative %: 46.1 % (ref 43.0–77.0)
RBC: 3.36 Mil/uL — ABNORMAL LOW (ref 3.87–5.11)
WBC: 8.5 10*3/uL (ref 4.5–10.5)

## 2012-01-04 LAB — BASIC METABOLIC PANEL
BUN: 32 mg/dL — ABNORMAL HIGH (ref 6–23)
CO2: 25 mEq/L (ref 19–32)
Chloride: 103 mEq/L (ref 96–112)
Creatinine, Ser: 1 mg/dL (ref 0.4–1.2)
Glucose, Bld: 118 mg/dL — ABNORMAL HIGH (ref 70–99)
Potassium: 4.5 mEq/L (ref 3.5–5.1)

## 2012-02-11 ENCOUNTER — Ambulatory Visit (INDEPENDENT_AMBULATORY_CARE_PROVIDER_SITE_OTHER): Payer: Medicare Other | Admitting: Vascular Surgery

## 2012-02-11 ENCOUNTER — Other Ambulatory Visit: Payer: Self-pay | Admitting: *Deleted

## 2012-02-11 ENCOUNTER — Encounter: Payer: Self-pay | Admitting: Vascular Surgery

## 2012-02-11 ENCOUNTER — Encounter (INDEPENDENT_AMBULATORY_CARE_PROVIDER_SITE_OTHER): Payer: Medicare Other

## 2012-02-11 VITALS — BP 162/70 | HR 81 | Ht 62.0 in | Wt 139.8 lb

## 2012-02-11 DIAGNOSIS — L97509 Non-pressure chronic ulcer of other part of unspecified foot with unspecified severity: Secondary | ICD-10-CM

## 2012-02-11 DIAGNOSIS — I70219 Atherosclerosis of native arteries of extremities with intermittent claudication, unspecified extremity: Secondary | ICD-10-CM

## 2012-02-11 NOTE — Progress Notes (Signed)
[facility]  Subjective   She is following up on her right 5 th toe foot ulcer.  She does also have a history of peripheral arterial disease.  Chronic ulcer that has been present for several months but slowly getting worse.  It is painful to her.  Gradually larger, no fever or chills.  Past Medical History  Diagnosis Date  . Coronary artery disease     a. h/o NSTEMI in setting of AFib/sepsis (in [place]  b.  MV 2/13: EF 57%, mod isch septum and poss small part of the Ant wall;  c.  LHC 04/06/11:  dLM 40%, oLAD 70%, then long 50%, then occluded, oDx 95%, p-mDx 60%, AVCFX occluded, oOM 90% with inf branch occluded, ext network of occluded OMs and PLs (fill s/w by collats), p+mRI 60%,, pRCA 40-50%, oAM 80%, EF 55-65%   . Atrial fibrillation     in setting of sepsis in [place]; tx with amiodarone (amio stopped 04/20/11);    Marland Kitchen Injury to liver   . Allergic rhinitis due to pollen   . Hyperlipidemia   . Hypertension   . Vitamin D deficiency   . Anemia, unspecified   . Arthritis   . Diabetes mellitus     borderline  . Diverticulitis of colon with perforation s/p colectomy/ostomy 13Oct2012 02/23/2011  . Carotid stenosis     a.  02/2011 - RICA 80-99% and LICA 60-79%;  b. s/p R CEA Dr. Darrick Penna 09/2011  . Complication of anesthesia     Allergie to narcotics and abx.  Marland Kitchen Hx of echocardiogram     a.  Echo 10/2010 (in setting of NSTEMI at [facility] in [facility]):  mid ot apical septal AK, EF 40-45%, MAC, mild to mod MR, mild TR, no pulmo HTN;  b. Echo 02/26/11: Septal and apical hypokinesis, moderate LVE, EF 45-50%, posterior MAC with bileaflet mitral valve prolapse, posteriorly directed eccentric MR with moderate MR, mild BAE.  Marland Kitchen PAD (peripheral artery disease)     a. ABIs 9/13: R 0.55; L 0.93;  b. arterial insufficiency ulcers on right foot;  followed by [facility]  . Hypothyroid      Past Surgical History  Procedure Date  . Appendectomy   . Total hip arthroplasty 07/2004    Right   . Colostomy 10/2010  . Cesarean section 1955, 1959, 1960  . Abdominal hysterectomy 1976  . Bladder repair 1976  . Abdominal adhesion surgery     at time of hysterectomy  . Joint replacement 2006    Right  Hip  . Endarterectomy 10/20/2011    Procedure: ENDARTERECTOMY CAROTID;  Surgeon: Sherren Kerns, MD;  Location: [facility] OR;  Service: Vascular;  Laterality: Right;  Right Carotid Endarterectomy with patch angioplasty  . Carotid endarterectomy     History   Social History  . Marital Status: Widowed    Spouse Name: N/A    Number of Children: N/A  . Years of Education: N/A   Occupational History  . Not on file.   Social History Main Topics  . Smoking status: Never Smoker   . Smokeless tobacco: Never Used  . Alcohol Use: No  . Drug Use: No  . Sexually Active: Not on file   Other Topics Concern  . Not on file   Social History Narrative   Lives with    Family History  Problem Relation Age of Onset  . Heart disease Mother 68    Pacemaker, No CAD Heart Disease before age  60  . Diverticulitis Mother   . Diabetes Mother   . Hyperlipidemia Mother   . Hypertension Mother   . Lung disease Father 93    Smoking  . Ovarian cancer Daughter   . Cancer Daughter   . Colon cancer Neg Hx   . Heart disease Maternal Grandmother   . Diabetes Maternal Aunt     x 2  . Diabetes Sister   . Heart disease Sister     Heart Disease before age 65  . Hyperlipidemia Sister   . Hypertension Sister     Review of systems: Cardiac: Denies chest pain Respiratory: Shortness of breath with exertion Musculoskeletal: She is ambulatory   Objective 162/70 81  Height: 5\' 2"  (1.575 m)  Weight: 131 lb (59.421 kg)     99%  ABI right 0.55 left 0.93 10/08/2011   Review of systems: She denies shortness of breath. She does have periodic chest pain and takes nitroglycerin. PRN.  PE:  Right 5th toe with erythematous base and edges. No active drainage. Cap refill is brisk. 2+ femoral pulses  bilaterally with absent popliteal and pedal pulses  Assessment/Planning: PAD in the right lower extremity Right 5 th digit chronic ulcer Dr. Darrick Penna will have her scheduled for Bilateral angiogram with intervention Jan. 31/2014   Clinton Gallant [facility] 02/11/2012 3:37 PM   History and exam details as above. Right fifth toe has a 2-3 cm ulceration on the lateral aspect. The base is clean. This is larger than on her previous office visit. I discussed with the patient and her daughter risks benefits possible complications and procedure details of an arteriogram. We will also consider possible percutaneous intervention at that time. Also discussed with them that if the lesions are not amenable to percutaneous approach would need to consider a bypass operation.  Fabienne Bruns, MD [facility] Office: [phone] Pager: [phone]

## 2012-02-15 ENCOUNTER — Encounter (HOSPITAL_COMMUNITY): Payer: Self-pay | Admitting: Pharmacy Technician

## 2012-02-17 ENCOUNTER — Other Ambulatory Visit: Payer: Self-pay | Admitting: Physician Assistant

## 2012-02-19 ENCOUNTER — Other Ambulatory Visit: Payer: Self-pay

## 2012-02-24 ENCOUNTER — Ambulatory Visit (INDEPENDENT_AMBULATORY_CARE_PROVIDER_SITE_OTHER): Payer: Medicare Other | Admitting: Physician Assistant

## 2012-02-24 ENCOUNTER — Encounter: Payer: Self-pay | Admitting: Physician Assistant

## 2012-02-24 VITALS — BP 158/62 | HR 61 | Ht 62.0 in | Wt 123.2 lb

## 2012-02-24 DIAGNOSIS — I10 Essential (primary) hypertension: Secondary | ICD-10-CM

## 2012-02-24 DIAGNOSIS — I739 Peripheral vascular disease, unspecified: Secondary | ICD-10-CM

## 2012-02-24 DIAGNOSIS — E785 Hyperlipidemia, unspecified: Secondary | ICD-10-CM

## 2012-02-24 DIAGNOSIS — I2510 Atherosclerotic heart disease of native coronary artery without angina pectoris: Secondary | ICD-10-CM

## 2012-02-24 MED ORDER — EZETIMIBE 10 MG PO TABS: 10.0000 mg | ORAL_TABLET | Freq: Every day | ORAL | Status: DC

## 2012-02-24 MED ORDER — AMLODIPINE BESYLATE 5 MG PO TABS: 5.0000 mg | ORAL_TABLET | Freq: Two times a day (BID) | ORAL | Status: DC

## 2012-02-24 NOTE — Progress Notes (Signed)
[address] [place], [place]  [address] Phone: [phone], Fax:  [phone]  Date:  02/24/2012   ID:  Julia Greene, DOB November 22, 1930, MRN [id number]  PCP:  Oneal Grout, MD  Primary Cardiologist:  Dr. Rollene Rotunda     History of Present Illness: Julia Greene is a 79 y.o. female who returns for follow up.  She established with Dr. Antoine Poche 01/2011 after moving here from [facility]. She had a NSTEMI in the setting of atrial fibrillation and perforated diverticulum with peritoneal abscess. She had preserved LVF and mitral regurgitation noted. Myoview demonstrated septal and anterior ischemia. Cardiac cath demonstrated severe diffuse three-vessel coronary artery disease. She had no high grade lesions amenable to PCI and surgical revascularization would be very incomplete. Medical Rx recommended. She was noted to have a high grade RICA stenosis. She was admitted 9/13 with chest discomfort. Cath films were reviewed again. Medical therapy continued to be recommended and she was placed on nitrates.  Patient underwent right CEA by Dr. Darrick Penna 10/20/11.  Recently saw Dr. Darrick Penna in follow up for PAD with assoc right foot ulcers.  Plan is to proceed with lower extremity angiogram with possible intervention.    She notes occasional chest discomfort consistent with her angina. Overall, this is much improved. She notes it more when she has been more active. She has more occurrences in the early evening hours. She denies significant dyspnea. She denies syncope. She denies orthopnea or PND. Her lower extremity edema is stable.  Labs (9/13):   K 4.8, creatinine 0.76, ALT 8, LDL 154, Hgb 8.6, TSH 12.011 Labs (12/13): K 4.5, creatinine 1.0, Hgb 9.9, TSH 12.011 (T4 0.77, T3 2.4)  Wt Readings from Last 3 Encounters:  02/24/12 123 lb 3.2 oz (55.883 kg)  02/11/12 139 lb 12.8 oz (63.413 kg)  11/24/11 132 lb 1.9 oz (59.929 kg)     Past Medical History  Diagnosis Date  . Coronary artery  disease     a. h/o NSTEMI in setting of AFib/se[place] ([place]  b.  MV 2/13: EF 57%, mod isch septum and poss small part of the Ant wall;  c.  LHC 04/06/11:  dLM 40%, oLAD 70%, then long 50%, then occluded, oDx 95%, p-mDx 60%, AVCFX occluded, oOM 90% with inf branch occluded, ext network of occluded OMs and PLs (fill s/w by collats), p+mRI 60%,, pRCA 40-50%, oAM 80%, EF 55-65%   . Atrial fibrillation     in setting of sepsis in [place]; tx with amiodarone (amio stopped 04/20/11);    Marland Kitchen Injury to liver   . Allergic rhinitis due to pollen   . Hyperlipidemia   . Hypertension   . Vitamin D deficiency   . Anemia, unspecified   . Arthritis   . Diabetes mellitus     borderline  . Diverticulitis of colon with perforation s/p colectomy/ostomy 13Oct2012 02/23/2011  . Carotid stenosis     a.  02/2011 - RICA 80-99% and LICA 60-79%;  b. s/p R CEA Dr. Darrick Penna 09/2011  . Complication of anesthesia     Allergie to narcotics and abx.  Marland Kitchen Hx of echocardiogram     a.  Echo 10/2010 (in setting of NSTEMI at [facility] in [facility]):  mid ot apical septal AK, EF 40-45%, MAC, mild to mod MR, mild TR, no pulmo HTN;  b. Echo 02/26/11: Septal and apical hypokinesis, moderate LVE, EF 45-50%, posterior MAC with bileaflet mitral valve prolapse, posteriorly directed eccentric MR with moderate MR,  mild BAE.  Marland Kitchen PAD (peripheral artery disease)     a. ABIs 9/13: R 0.55; L 0.93;  b. arterial insufficiency ulcers on right foot;  followed by [facility]  . Hypothyroid     Current Outpatient Prescriptions  Medication Sig Dispense Refill  . acetaminophen-codeine (TYLENOL #3) 300-30 MG per tablet Take 1 tablet by mouth every 4 (four) hours as needed for pain.  30 tablet  0  . amLODipine (NORVASC) 5 MG tablet Take 1 tablet (5 mg total) by mouth daily.  30 tablet  11  . aspirin EC 81 MG tablet Take 1 tablet (81 mg total) by mouth every other day.      . Calcium Carbonate-Vitamin D (CALCIUM 600 + D PO) Take 1 tablet by mouth 2 (two) times daily.       . DiphenhydrAMINE HCl (BENADRYL PO) Take 30 mLs by mouth as needed. For cough      . isosorbide mononitrate (IMDUR) 120 MG 24 hr tablet Take 1 tablet (120 mg total) by mouth daily.      Marland Kitchen levothyroxine (SYNTHROID, LEVOTHROID) 25 MCG tablet Take 25 mcg by mouth daily.      . Melatonin 5 MG CAPS Take 5 mg by mouth at bedtime as needed. To help sleep      . Methylsulfonylmethane (MSM) 1000 MG TABS Take 1 tablet by mouth daily.      . metoprolol tartrate (LOPRESSOR) 25 MG tablet Take 12.5 mg by mouth 2 (two) times daily.       . Multiple Vitamins-Minerals (ADULT GUMMY PO) Take 2 tablets by mouth daily.      . nitroGLYCERIN (NITROSTAT) 0.4 MG SL tablet Place 1 tablet (0.4 mg total) under the tongue every 5 (five) minutes x 3 doses as needed. For chest pain  25 tablet  3  . [DISCONTINUED] amiodarone (PACERONE) 200 MG tablet Take 200 mg by mouth daily.        Allergies:    Allergies  Allergen Reactions  . Demerol Other (See Comments)    coma  . Codeine Swelling and Rash    Injectable. Can take by mouth.  . Benzoin     coma  . Cephalexin     Turns red  . Cephalosporins     coma  . Contrast Media (Iodinated Diagnostic Agents)     Passed out  . Dilaudid (Hydromorphone Hcl)     coma  . Insulins Swelling  . Lisinopril     cough  . Morphine And Related     coma  . Other     Patient highly sensitive to antibiotics, narcotics and any medication used to put patient to sleep for surgery.  . Penicillins     coma  . Sulfa Antibiotics     coma  . Aspirin     coma  . Latex Rash    Social History:  The patient  reports that she has never smoked. She has never used smokeless tobacco. She reports that she does not drink alcohol or use illicit drugs.   ROS:  Please see the history of present illness.      All other systems reviewed and negative.   PHYSICAL EXAM: VS:  BP 158/62  Pulse 61  Ht 5\' 2"  (1.575 m)  Wt 123 lb 3.2 oz (55.883 kg)  BMI 22.53 kg/m2 Well nourished, well developed,  in no acute distress HEENT: normal Neck: no JVD Cardiac:  normal S1, S2; RRR; 2/6 systolic ejection murmur heard best along the  left sternal border Lungs:  clear to auscultation bilaterally, no wheezing, rhonchi or rales Abd: soft, nontender, no hepatomegaly Ext: no edema Skin: warm and dry; ulcer noted on the lateral aspect of the right fifth toe Neuro:  CNs 2-12 intact, no focal abnormalities noted  EKG:  NSR, HR 61, no acute change     ASSESSMENT AND PLAN:  1. Coronary Artery Disease:  She has chronic, stable angina. Her chest discomfort seems to occur more in the early evening hours. She already takes her isosorbide at bedtime. Her blood pressure remains elevated. I have recommended that she change her amlodipine to 5 mg twice a day. If she does not achieve complete relief with this, we could consider adding another dose of isosorbide 30 mg in the morning in addition to her evening dose. Continue aspirin and beta blocker. 2. Hypertension: Increase amlodipine as noted. Consider adding ARB in the future given her wall motion abnormalities and reduced ejection fraction in the setting of ischemic heart disease. She could not tolerate ACE inhibitor due to cough. For now, we will focus on anginal control. 3. Peripheral Arterial Disease:  She has planned angiogram with Dr. Darrick Penna soon. 4. Hyperlipidemia:  She was unable to tolerate Crestor as well. Her LDL is significantly elevated. I have asked her to try Zetia 10 mg daily. Check lipids and LFTs in 3 months. 5. Disposition:  Followup with Dr. Antoine Poche 3 months.  Signed, Tereso Newcomer, PA-C  12:17 PM 02/24/2012

## 2012-02-24 NOTE — Patient Instructions (Addendum)
INCREASE NORVASC TO 5 MG TWICE DAILY; A REFILL WAS SENT IN TODAY FOR YOU  YOU HAVE BEEN GIVEN ZETIA 10 MG SAMPLES TO TRY FOR 1 MONTHS AND IF YOU CAN TOLERATE THIS PLEASE CALL SO THAT WE CAN SEND I  A PRESCRIPTION ; IF YOU STAY ON THE ZETIA YOU WILL NEED TO HAVE FASTING LIPID AND LIVER PANEL DONE IN 3 MONTHS  PLEASE FOLLOW UP WITH  DR. HOCHREIN IN 3 MONTHS

## 2012-02-25 ENCOUNTER — Encounter (HOSPITAL_COMMUNITY): Payer: Self-pay

## 2012-02-25 MED ORDER — SODIUM CHLORIDE 0.9 % IV SOLN: INTRAVENOUS | Status: DC

## 2012-02-26 ENCOUNTER — Telehealth: Payer: Self-pay | Admitting: Vascular Surgery

## 2012-02-26 ENCOUNTER — Ambulatory Visit (HOSPITAL_COMMUNITY)
Admission: RE | Admit: 2012-02-26 | Discharge: 2012-02-26 | Disposition: A | Discharge status: Home / Self Care | Payer: Medicare Other | Source: Ambulatory Visit | Attending: Vascular Surgery | Admitting: Vascular Surgery

## 2012-02-26 ENCOUNTER — Encounter (HOSPITAL_COMMUNITY)
Admission: RE | Disposition: A | Discharge status: Home / Self Care | Payer: Self-pay | Source: Ambulatory Visit | Attending: Vascular Surgery

## 2012-02-26 DIAGNOSIS — I739 Peripheral vascular disease, unspecified: Secondary | ICD-10-CM | POA: Insufficient documentation

## 2012-02-26 DIAGNOSIS — I658 Occlusion and stenosis of other precerebral arteries: Secondary | ICD-10-CM | POA: Insufficient documentation

## 2012-02-26 DIAGNOSIS — I252 Old myocardial infarction: Secondary | ICD-10-CM | POA: Insufficient documentation

## 2012-02-26 DIAGNOSIS — I10 Essential (primary) hypertension: Secondary | ICD-10-CM | POA: Insufficient documentation

## 2012-02-26 DIAGNOSIS — L98499 Non-pressure chronic ulcer of skin of other sites with unspecified severity: Secondary | ICD-10-CM | POA: Insufficient documentation

## 2012-02-26 DIAGNOSIS — D649 Anemia, unspecified: Secondary | ICD-10-CM | POA: Insufficient documentation

## 2012-02-26 DIAGNOSIS — I2510 Atherosclerotic heart disease of native coronary artery without angina pectoris: Secondary | ICD-10-CM | POA: Insufficient documentation

## 2012-02-26 DIAGNOSIS — I4891 Unspecified atrial fibrillation: Secondary | ICD-10-CM | POA: Insufficient documentation

## 2012-02-26 DIAGNOSIS — I6529 Occlusion and stenosis of unspecified carotid artery: Secondary | ICD-10-CM | POA: Insufficient documentation

## 2012-02-26 DIAGNOSIS — R7309 Other abnormal glucose: Secondary | ICD-10-CM | POA: Insufficient documentation

## 2012-02-26 DIAGNOSIS — Z96649 Presence of unspecified artificial hip joint: Secondary | ICD-10-CM | POA: Insufficient documentation

## 2012-02-26 DIAGNOSIS — E785 Hyperlipidemia, unspecified: Secondary | ICD-10-CM | POA: Insufficient documentation

## 2012-02-26 HISTORY — PX: ABDOMINAL ANGIOGRAM: SHX5499

## 2012-02-26 HISTORY — PX: LOWER EXTREMITY ANGIOGRAM: SHX5508

## 2012-02-26 LAB — POCT I-STAT, CHEM 8
BUN: 46 mg/dL — ABNORMAL HIGH (ref 6–23)
Calcium, Ion: 1.25 mmol/L (ref 1.13–1.30)
Chloride: 113 mEq/L — ABNORMAL HIGH (ref 96–112)
Creatinine, Ser: 1.3 mg/dL — ABNORMAL HIGH (ref 0.50–1.10)
Glucose, Bld: 129 mg/dL — ABNORMAL HIGH (ref 70–99)

## 2012-02-26 SURGERY — ANGIOGRAM, LOWER EXTREMITY
Anesthesia: LOCAL

## 2012-02-26 MED ORDER — LIDOCAINE HCL (PF) 1 % IJ SOLN
INTRAMUSCULAR | Status: AC
  Filled 2012-02-26: qty 30

## 2012-02-26 MED ORDER — ACETAMINOPHEN 325 MG RE SUPP: 325.0000 mg | RECTAL | Status: DC | PRN

## 2012-02-26 MED ORDER — METHYLPREDNISOLONE SODIUM SUCC 125 MG IJ SOLR
125.0000 mg | INTRAMUSCULAR | Status: AC
  Administered 2012-02-26: 125 mg via INTRAVENOUS
  Filled 2012-02-26: qty 2

## 2012-02-26 MED ORDER — NITROGLYCERIN 0.4 MG SL SUBL: 0.4000 mg | SUBLINGUAL_TABLET | SUBLINGUAL | Status: DC | PRN

## 2012-02-26 MED ORDER — NITROGLYCERIN IN D5W 200-5 MCG/ML-% IV SOLN
INTRAVENOUS | Status: AC
  Filled 2012-02-26: qty 250

## 2012-02-26 MED ORDER — PANTOPRAZOLE SODIUM 40 MG PO TBEC: 40.0000 mg | DELAYED_RELEASE_TABLET | Freq: Every day | ORAL | Status: DC

## 2012-02-26 MED ORDER — HYDRALAZINE HCL 20 MG/ML IJ SOLN: 10.0000 mg | INTRAMUSCULAR | Status: DC | PRN

## 2012-02-26 MED ORDER — ACETAMINOPHEN 325 MG PO TABS: 325.0000 mg | ORAL_TABLET | ORAL | Status: DC | PRN

## 2012-02-26 MED ORDER — DIPHENHYDRAMINE HCL 50 MG/ML IJ SOLN
25.0000 mg | INTRAMUSCULAR | Status: AC
  Administered 2012-02-26: 25 mg via INTRAVENOUS
  Filled 2012-02-26: qty 1

## 2012-02-26 MED ORDER — ALUM & MAG HYDROXIDE-SIMETH 200-200-20 MG/5ML PO SUSP: 15.0000 mL | ORAL | Status: DC | PRN

## 2012-02-26 MED ORDER — PHENOL 1.4 % MT LIQD: 1.0000 | OROMUCOSAL | Status: DC | PRN

## 2012-02-26 MED ORDER — SODIUM CHLORIDE 0.45 % IV SOLN
INTRAVENOUS | Status: DC
  Administered 2012-02-26: 16:00:00 via INTRAVENOUS

## 2012-02-26 MED ORDER — NITROGLYCERIN IN D5W 200-5 MCG/ML-% IV SOLN
10.0000 ug/min | INTRAVENOUS | Status: DC
  Administered 2012-02-26: 10 ug/min via INTRAVENOUS

## 2012-02-26 MED ORDER — FAMOTIDINE IN NACL 20-0.9 MG/50ML-% IV SOLN
20.0000 mg | INTRAVENOUS | Status: AC
  Administered 2012-02-26: 20 mg via INTRAVENOUS
  Filled 2012-02-26: qty 50

## 2012-02-26 MED ORDER — LABETALOL HCL 5 MG/ML IV SOLN: 10.0000 mg | INTRAVENOUS | Status: DC | PRN

## 2012-02-26 MED ORDER — NITROGLYCERIN 0.4 MG/SPRAY TL SOLN: 1.0000 | Status: DC | PRN

## 2012-02-26 MED ORDER — ONDANSETRON HCL 4 MG/2ML IJ SOLN: 4.0000 mg | Freq: Four times a day (QID) | INTRAMUSCULAR | Status: DC | PRN

## 2012-02-26 MED ORDER — METOPROLOL TARTRATE 1 MG/ML IV SOLN: 2.0000 mg | INTRAVENOUS | Status: DC | PRN

## 2012-02-26 MED ORDER — GUAIFENESIN-DM 100-10 MG/5ML PO SYRP: 15.0000 mL | ORAL_SOLUTION | ORAL | Status: DC | PRN

## 2012-02-26 NOTE — Progress Notes (Signed)
Pt states her chest pain is a 9.5 or 10.  Pt has stable vs. Skin warm and dry.  Tearful.  Pt had taken one of her NTG SL tablets and tow of her home Tylenol with Codeine pills and all of her home meds.  Answering service called and stated Dr. Arbie Cookey is on call and they would page him.

## 2012-02-26 NOTE — Telephone Encounter (Addendum)
Message copied by Rosalyn Charters on Fri Feb 26, 2012  4:37 PM ------      Message from: Melene Plan      Created: Fri Feb 26, 2012  4:13 PM       FYI      ----- Message -----         From: Sherren Kerns, MD         Sent: 02/26/2012   3:12 PM           To: Reuel Derby, Melene Plan, RN            Aortogram with bilat runoff no ultrasound      2nd order right external iliac            Continue follow up with Rusty for Carotid whenever she is scheduled for follow up duplex.  Rusty can see her ulcer then also.  She has no revasc options.            Charles       l/v/m for pt. that fu is sheduled on 05-18-12 10:30 am and mailed appt. letter

## 2012-02-26 NOTE — H&P (View-Only) (Signed)
[facility]  Subjective   She is following up on her right 5 th toe foot ulcer.  She does also have a history of peripheral arterial disease.  Chronic ulcer that has been present for several months but slowly getting worse.  It is painful to her.  Gradually larger, no fever or chills.  Past Medical History  Diagnosis Date  . Coronary artery disease     a. h/o NSTEMI in setting of [place]ib/se[place] ([place]  b.  MV 2/13: EF 57%, mod isch septum and poss small part of the Ant wall;  c.  LHC 04/06/11:  dLM 40%, oLAD 70%, then long 50%, then occluded, oDx 95%, p-mDx 60%, AVCFX occluded, oOM 90% with inf branch occluded, ext network of occluded OMs and PLs (fill s/w by collats), p+mRI 60%,, pRCA 40-50%, oAM 80%, EF 55-65%   . Atrial fibrillation     in setting of sepsis in [place]; tx with amiodarone (amio stopped 04/20/11);    . Injury to liver   . Allergic rhinitis due to pollen   . Hyperlipidemia   . Hypertension   . Vitamin D deficiency   . Anemia, unspecified   . Arthritis   . Diabetes mellitus     borderline  . Diverticulitis of colon with perforation s/p colectomy/ostomy 13Oct2012 02/23/2011  . Carotid stenosis     a.  02/2011 - RICA 80-99% and LICA 60-79%;  b. s/p R CEA Dr. Fields 09/2011  . Complication of anesthesia     Allergie to narcotics and abx.  . Hx of echocardiogram     a.  Echo 10/2010 (in setting of NSTEMI at [facility] in [place]):  mid ot apical septal AK, EF 40-45%, MAC, mild to mod MR, mild TR, no pulmo HTN;  b. Echo 02/26/11: Septal and apical hypokinesis, moderate LVE, EF 45-50%, posterior MAC with bileaflet mitral valve prolapse, posteriorly directed eccentric MR with moderate MR, mild BAE.  . PAD (peripheral artery disease)     a. ABIs 9/13: R 0.55; L 0.93;  b. arterial insufficiency ulcers on right foot;  followed by [facility]  . Hypothyroid      Past Surgical History  Procedure Date  . Appendectomy   . Total hip arthroplasty 07/2004    Right   . Colostomy 10/2010  . Cesarean section 1955, 1959, 1960  . Abdominal hysterectomy 1976  . Bladder repair 1976  . Abdominal adhesion surgery     at time of hysterectomy  . Joint replacement 2006    Right  Hip  . Endarterectomy 10/20/2011    Procedure: ENDARTERECTOMY CAROTID;  Surgeon: Charles E Fields, MD;  Location: [facility] OR;  Service: Vascular;  Laterality: Right;  Right Carotid Endarterectomy with patch angioplasty  . Carotid endarterectomy     History   Social History  . Marital Status: Widowed    Spouse Name: N/A    Number of Children: N/A  . Years of Education: N/A   Occupational History  . Not on file.   Social History Main Topics  . Smoking status: Never Smoker   . Smokeless tobacco: Never Used  . Alcohol Use: No  . Drug Use: No  . Sexually Active: Not on file   Other Topics Concern  . Not on file   Social History Narrative   Lives with    Family History  Problem Relation Age of Onset  . Heart disease Mother 75    Pacemaker, No CAD Heart Disease before age   60  . Diverticulitis Mother   . Diabetes Mother   . Hyperlipidemia Mother   . Hypertension Mother   . Lung disease Father 78    Smoking  . Ovarian cancer Daughter   . Cancer Daughter   . Colon cancer Neg Hx   . Heart disease Maternal Grandmother   . Diabetes Maternal Aunt     x 2  . Diabetes Sister   . Heart disease Sister     Heart Disease before age 60  . Hyperlipidemia Sister   . Hypertension Sister     Review of systems: Cardiac: Denies chest pain Respiratory: Shortness of breath with exertion Musculoskeletal: She is ambulatory   Objective 162/70 81  Height: 5' 2" (1.575 m)  Weight: 131 lb (59.421 kg)     99%  ABI right 0.55 left 0.93 10/08/2011   Review of systems: She denies shortness of breath. She does have periodic chest pain and takes nitroglycerin. PRN.  PE:  Right 5th toe with erythematous base and edges. No active drainage. Cap refill is brisk. 2+ femoral pulses  bilaterally with absent popliteal and pedal pulses  Assessment/Planning: PAD in the right lower extremity Right 5 th digit chronic ulcer Dr. Fields will have her scheduled for Bilateral angiogram with intervention Jan. 31/2014   [place] MAUREEN 02/11/2012 3:37 PM   History and exam details as above. Right fifth toe has a 2-3 cm ulceration on the lateral aspect. The base is clean. This is larger than on her previous office visit. I discussed with the patient and her daughter risks benefits possible complications and procedure details of an arteriogram. We will also consider possible percutaneous intervention at that time. Also discussed with them that if the lesions are not amenable to percutaneous approach would need to consider a bypass operation.  Charles Fields, MD [facility] [place] Office: [phone] Pager: [phone]  

## 2012-02-26 NOTE — Op Note (Signed)
Procedure: Abdominal aortogram with bilateral lower extremity runoff  Preoperative diagnosis: Nonhealing wound right foot Postoperative diagnosis: Same  Anesthesia: Local  Indications: Patient is 79 year old female with a chronic nonhealing wound of her right second toe  Operative details: After obtaining informed consent, the patient was taken to the PV lab. The patient was placed in supine position the Angio table. Both groins were prepped and draped in usual sterile fashion. Local anesthesia was infiltrated over the left common femoral artery. An introducer needle was used to cannulate the left common femoral artery an 035 Versacore wire was threaded up into the abdominal aorta under fluoroscopic guidance. Next a 5 French sheath is posterior the guidewire and the left common femoral artery. This was thoroughly flushed with heparinized saline. A 5 French pigtail catheter was then placed over the guidewire into the abdominal aorta. An abdominal aortogram was then obtained and AP projection. The left and right renal arteries are patent. The infrarenal abdominal aorta has moderate atherosclerotic change but no flow limiting stenosis. The left and right common iliac artery 725% stenosis at the origin. The left and right external and internal iliac arteries are patent. Next the pigtail catheter was pulled down just above the aortic bifurcation. Pelvic angiogram was obtained also the oblique projection because of overlying hip hardware. The patient has bilateral patent common femoral and profunda femoris arteries. Next bilateral extremity runoff views were obtained. The superficial femoral artery is patent proximally. The profunda femoris is patent. The superficial femoral and proximal popliteal artery occludes bilaterally. The popliteal artery is occluded throughout its course bilaterally. There is one-vessel runoff via the peroneal artery to the foot bilaterally. This is a very small vessel. The patient had  experienced some chest pain periprocedure which resolved with 2 nitroglycerin sprays. However when the patient was coming off of the table she again again to experience some chest pain. She was taken to the holding area with her sheath in place and we'll obtain a 12-lead EKG in the recovery area.  Otherwise she tolerated the procedure well and there were no complications.  The patient was taken to the holding area in stable condition.  Operative findings: Severe bilateral superficial femoral and popliteal artery occlusive disease with one-vessel runoff via a very small peroneal artery bilaterally  Management: The patient is not a very good operative risk for a bypass repair. She is not a candidate for percutaneous revascularization. I will discuss all these options with her family. Most likely we will offer conservative management with local wound care and hopefully the ulcer on her foot will continue to heal over time.  Fabienne Bruns, MD [facility] [place] Office: [phone] Pager: [phone]

## 2012-02-26 NOTE — Progress Notes (Signed)
Pt has taken another of her NTG SL tablets.  Calm and talking normally.  Dr, Early has not yet returned call.  Will page again.  Family states pt seems herself and that she has been having these episodes at home,.  Pt states she has had these about every other day.  Pt states  her cadiologist, Dr. Antoine Poche,  is aware and she spoke with his PA yesterday, ie Francesca Oman.A.

## 2012-02-26 NOTE — Interval H&P Note (Signed)
History and Physical Interval Note:  02/26/2012 1:49 PM  Julia Greene  has presented today for surgery, with the diagnosis of pvd with ulcer  The various methods of treatment have been discussed with the patient and family. After consideration of risks, benefits and other options for treatment, the patient has consented to  Procedure(s) (LRB) with comments: LOWER EXTREMITY ANGIOGRAM (N/A) as a surgical intervention .  The patient's history has been reviewed, patient examined, no change in status, stable for surgery.  I have reviewed the patient's chart and labs.  Questions were answered to the patient's satisfaction.     FIELDS,CHARLES E

## 2012-02-27 ENCOUNTER — Encounter (HOSPITAL_COMMUNITY): Payer: Self-pay

## 2012-02-27 ENCOUNTER — Emergency Department (HOSPITAL_COMMUNITY): Payer: Medicare Other

## 2012-02-27 ENCOUNTER — Inpatient Hospital Stay (HOSPITAL_COMMUNITY)
Admission: EM | Admit: 2012-02-27 | Discharge: 2012-03-01 | DRG: 281 | Disposition: A | Discharge status: Home / Self Care | Payer: Medicare Other | Attending: Cardiology | Admitting: Cardiology

## 2012-02-27 DIAGNOSIS — L97509 Non-pressure chronic ulcer of other part of unspecified foot with unspecified severity: Secondary | ICD-10-CM | POA: Diagnosis present

## 2012-02-27 DIAGNOSIS — I70209 Unspecified atherosclerosis of native arteries of extremities, unspecified extremity: Secondary | ICD-10-CM | POA: Diagnosis present

## 2012-02-27 DIAGNOSIS — I340 Nonrheumatic mitral (valve) insufficiency: Secondary | ICD-10-CM | POA: Diagnosis present

## 2012-02-27 DIAGNOSIS — I059 Rheumatic mitral valve disease, unspecified: Secondary | ICD-10-CM | POA: Diagnosis present

## 2012-02-27 DIAGNOSIS — I7092 Chronic total occlusion of artery of the extremities: Secondary | ICD-10-CM | POA: Diagnosis present

## 2012-02-27 DIAGNOSIS — E559 Vitamin D deficiency, unspecified: Secondary | ICD-10-CM | POA: Diagnosis present

## 2012-02-27 DIAGNOSIS — I219 Acute myocardial infarction, unspecified: Secondary | ICD-10-CM

## 2012-02-27 DIAGNOSIS — K5720 Diverticulitis of large intestine with perforation and abscess without bleeding: Secondary | ICD-10-CM | POA: Diagnosis present

## 2012-02-27 DIAGNOSIS — I2510 Atherosclerotic heart disease of native coronary artery without angina pectoris: Secondary | ICD-10-CM | POA: Diagnosis present

## 2012-02-27 DIAGNOSIS — Z96649 Presence of unspecified artificial hip joint: Secondary | ICD-10-CM

## 2012-02-27 DIAGNOSIS — I428 Other cardiomyopathies: Secondary | ICD-10-CM | POA: Diagnosis present

## 2012-02-27 DIAGNOSIS — E785 Hyperlipidemia, unspecified: Secondary | ICD-10-CM | POA: Diagnosis present

## 2012-02-27 DIAGNOSIS — I10 Essential (primary) hypertension: Secondary | ICD-10-CM | POA: Diagnosis present

## 2012-02-27 DIAGNOSIS — D649 Anemia, unspecified: Secondary | ICD-10-CM | POA: Diagnosis present

## 2012-02-27 DIAGNOSIS — I214 Non-ST elevation (NSTEMI) myocardial infarction: Principal | ICD-10-CM | POA: Diagnosis present

## 2012-02-27 DIAGNOSIS — Z7982 Long term (current) use of aspirin: Secondary | ICD-10-CM

## 2012-02-27 DIAGNOSIS — I739 Peripheral vascular disease, unspecified: Secondary | ICD-10-CM | POA: Diagnosis present

## 2012-02-27 DIAGNOSIS — I200 Unstable angina: Secondary | ICD-10-CM

## 2012-02-27 DIAGNOSIS — E119 Type 2 diabetes mellitus without complications: Secondary | ICD-10-CM | POA: Diagnosis present

## 2012-02-27 DIAGNOSIS — M1990 Unspecified osteoarthritis, unspecified site: Secondary | ICD-10-CM | POA: Diagnosis present

## 2012-02-27 DIAGNOSIS — I4891 Unspecified atrial fibrillation: Secondary | ICD-10-CM | POA: Diagnosis present

## 2012-02-27 DIAGNOSIS — L97519 Non-pressure chronic ulcer of other part of right foot with unspecified severity: Secondary | ICD-10-CM | POA: Diagnosis present

## 2012-02-27 DIAGNOSIS — E039 Hypothyroidism, unspecified: Secondary | ICD-10-CM | POA: Diagnosis present

## 2012-02-27 LAB — POCT I-STAT, CHEM 8
HCT: 29 % — ABNORMAL LOW (ref 36.0–46.0)
Hemoglobin: 9.9 g/dL — ABNORMAL LOW (ref 12.0–15.0)
Potassium: 4.7 mEq/L (ref 3.5–5.1)
Sodium: 139 mEq/L (ref 135–145)
TCO2: 20 mmol/L (ref 0–100)

## 2012-02-27 LAB — POCT I-STAT TROPONIN I
Troponin i, poc: 0 ng/mL (ref 0.00–0.08)
Troponin i, poc: 0.25 ng/mL (ref 0.00–0.08)

## 2012-02-27 LAB — TROPONIN I: Troponin I: 0.65 ng/mL (ref <0.30)

## 2012-02-27 LAB — GLUCOSE, CAPILLARY: Glucose-Capillary: 108 mg/dL — ABNORMAL HIGH (ref 70–99)

## 2012-02-27 MED ORDER — SODIUM CHLORIDE 0.9 % IJ SOLN: 3.0000 mL | INTRAMUSCULAR | Status: DC | PRN

## 2012-02-27 MED ORDER — SODIUM CHLORIDE 0.9 % IV SOLN
250.0000 mL | INTRAVENOUS | Status: DC | PRN
  Administered 2012-02-28: 250 mL via INTRAVENOUS

## 2012-02-27 MED ORDER — ASPIRIN EC 81 MG PO TBEC
81.0000 mg | DELAYED_RELEASE_TABLET | Freq: Every day | ORAL | Status: DC
  Administered 2012-02-28 – 2012-03-01 (×3): 81 mg via ORAL
  Filled 2012-02-27 (×3): qty 1

## 2012-02-27 MED ORDER — AMLODIPINE BESYLATE 5 MG PO TABS
5.0000 mg | ORAL_TABLET | Freq: Two times a day (BID) | ORAL | Status: DC
  Administered 2012-02-27 – 2012-03-01 (×6): 5 mg via ORAL
  Filled 2012-02-27 (×7): qty 1

## 2012-02-27 MED ORDER — HYDROCODONE-ACETAMINOPHEN 5-325 MG PO TABS
1.0000 | ORAL_TABLET | Freq: Once | ORAL | Status: AC
  Administered 2012-02-27: 1 via ORAL
  Filled 2012-02-27: qty 1

## 2012-02-27 MED ORDER — LEVOTHYROXINE SODIUM 25 MCG PO TABS
25.0000 ug | ORAL_TABLET | Freq: Every day | ORAL | Status: DC
  Administered 2012-02-27 – 2012-03-01 (×4): 25 ug via ORAL
  Filled 2012-02-27 (×4): qty 1

## 2012-02-27 MED ORDER — ASPIRIN 300 MG RE SUPP
300.0000 mg | RECTAL | Status: AC
  Filled 2012-02-27: qty 1

## 2012-02-27 MED ORDER — EZETIMIBE 10 MG PO TABS
10.0000 mg | ORAL_TABLET | Freq: Every day | ORAL | Status: DC
  Administered 2012-02-27 – 2012-03-01 (×4): 10 mg via ORAL
  Filled 2012-02-27 (×4): qty 1

## 2012-02-27 MED ORDER — HEPARIN (PORCINE) IN NACL 100-0.45 UNIT/ML-% IJ SOLN
750.0000 [IU]/h | INTRAMUSCULAR | Status: DC
  Administered 2012-02-27 – 2012-02-28 (×2): 750 [IU]/h via INTRAVENOUS
  Filled 2012-02-27 (×4): qty 250

## 2012-02-27 MED ORDER — CLOPIDOGREL BISULFATE 75 MG PO TABS
75.0000 mg | ORAL_TABLET | Freq: Every day | ORAL | Status: DC
  Administered 2012-02-28 – 2012-03-01 (×3): 75 mg via ORAL
  Filled 2012-02-27 (×3): qty 1

## 2012-02-27 MED ORDER — MELATONIN 5 MG PO CAPS: 5.0000 mg | ORAL_CAPSULE | Freq: Every evening | ORAL | Status: DC | PRN

## 2012-02-27 MED ORDER — CLOPIDOGREL BISULFATE 300 MG PO TABS
600.0000 mg | ORAL_TABLET | Freq: Once | ORAL | Status: AC
  Administered 2012-02-27: 600 mg via ORAL
  Filled 2012-02-27: qty 2

## 2012-02-27 MED ORDER — ASPIRIN 81 MG PO CHEW
324.0000 mg | CHEWABLE_TABLET | ORAL | Status: AC
  Administered 2012-02-27: 324 mg via ORAL

## 2012-02-27 MED ORDER — NITROGLYCERIN IN D5W 200-5 MCG/ML-% IV SOLN
2.0000 ug/min | INTRAVENOUS | Status: DC
  Administered 2012-02-27: 15 ug/min via INTRAVENOUS
  Administered 2012-02-27: 20 ug/min via INTRAVENOUS
  Administered 2012-02-27: 10 ug/min via INTRAVENOUS
  Administered 2012-02-27: 5 ug/min via INTRAVENOUS
  Filled 2012-02-27: qty 250

## 2012-02-27 MED ORDER — MUPIROCIN 2 % EX OINT
1.0000 "application " | TOPICAL_OINTMENT | Freq: Two times a day (BID) | CUTANEOUS | Status: DC
  Administered 2012-02-27 – 2012-03-01 (×6): 1 via NASAL
  Filled 2012-02-27 (×2): qty 22

## 2012-02-27 MED ORDER — NITROGLYCERIN 0.4 MG SL SUBL: 0.4000 mg | SUBLINGUAL_TABLET | SUBLINGUAL | Status: DC | PRN

## 2012-02-27 MED ORDER — ASPIRIN EC 81 MG PO TBEC: 81.0000 mg | DELAYED_RELEASE_TABLET | Freq: Every day | ORAL | Status: DC

## 2012-02-27 MED ORDER — CHLORHEXIDINE GLUCONATE CLOTH 2 % EX PADS
6.0000 | MEDICATED_PAD | Freq: Every day | CUTANEOUS | Status: DC
  Administered 2012-02-28 – 2012-03-01 (×3): 6 via TOPICAL

## 2012-02-27 MED ORDER — HEPARIN BOLUS VIA INFUSION
2500.0000 [IU] | Freq: Once | INTRAVENOUS | Status: AC
  Administered 2012-02-27: 2500 [IU] via INTRAVENOUS

## 2012-02-27 MED ORDER — NITROGLYCERIN 0.4 MG SL SUBL
0.4000 mg | SUBLINGUAL_TABLET | SUBLINGUAL | Status: DC | PRN
  Administered 2012-02-27: 0.4 mg via SUBLINGUAL
  Filled 2012-02-27: qty 25

## 2012-02-27 MED ORDER — FENTANYL CITRATE 0.05 MG/ML IJ SOLN
50.0000 ug | Freq: Once | INTRAMUSCULAR | Status: AC
  Administered 2012-02-27: 50 ug via INTRAVENOUS
  Filled 2012-02-27: qty 2

## 2012-02-27 MED ORDER — METOPROLOL TARTRATE 25 MG PO TABS
25.0000 mg | ORAL_TABLET | Freq: Four times a day (QID) | ORAL | Status: DC
  Administered 2012-02-27 – 2012-03-01 (×12): 25 mg via ORAL
  Filled 2012-02-27 (×15): qty 1

## 2012-02-27 MED ORDER — ACETAMINOPHEN-CODEINE #3 300-30 MG PO TABS
1.0000 | ORAL_TABLET | Freq: Three times a day (TID) | ORAL | Status: DC | PRN
  Administered 2012-02-29: 1 via ORAL
  Filled 2012-02-27: qty 2

## 2012-02-27 MED ORDER — ONDANSETRON HCL 4 MG/2ML IJ SOLN: 4.0000 mg | Freq: Four times a day (QID) | INTRAMUSCULAR | Status: DC | PRN

## 2012-02-27 MED ORDER — SODIUM CHLORIDE 0.9 % IJ SOLN
3.0000 mL | Freq: Two times a day (BID) | INTRAMUSCULAR | Status: DC
  Administered 2012-02-27 – 2012-03-01 (×6): 3 mL via INTRAVENOUS

## 2012-02-27 MED ORDER — ACETAMINOPHEN 325 MG PO TABS: 650.0000 mg | ORAL_TABLET | ORAL | Status: DC | PRN

## 2012-02-27 NOTE — Progress Notes (Signed)
CRITICAL VALUE ALERT  Critical value received: trop 6.25   Date of notification:  02/27/2012  Time of notification:  1915  Critical value read back:yes  Nurse who received alert:  GR  MD notified (1st page):  Burge  Time of first page:  1930  MD notified (2nd page):  Time of second page:  Responding MD:    Time MD responded:

## 2012-02-27 NOTE — Progress Notes (Signed)
Notified MD of pt's cp.  EKG obtained.  No changes noted.  VS Stable.  Pt states " almost gone"  Increased NTG gtt.  Will continue to monitor.  Lab notified of MRSA + will place pt on contact.  Emilie [place] Liter

## 2012-02-27 NOTE — ED Provider Notes (Signed)
History     CSN: [id number]  Arrival date & time 02/27/12  0901   First MD Initiated Contact with Patient 02/27/12 0915      Chief Complaint  Patient presents with  . Chest Pain     HPI Pt seen here yesterday and had arterial procedure by Dr. Darrick Penna, vascular surgeon. Per EMS, pt has many occlusions that are too advanced for intervention. Pt today c/o central to left CP described as heaviness that woke pt from sleep. Pt took her home nitro, Tylenol # 3, and 4 baby aspirin. Pt took 1st nitro at 0615, 2nd nitro and 1st Tylenol # 3 at 0630. 2nd Tylenol # 3 at 0700. 3rd nitro at 0810. 4 baby aspirin at approximately 0845  Past Medical History  Diagnosis Date  . Coronary artery disease     a. h/o NSTEMI in setting of [place]ib[place]se[place] ([place]  b.  MV 2/13: EF 57%, mod isch septum and poss small part of the Ant wall;  c.  LHC 04/06/11:  dLM 40%, oLAD 70%, then long 50%, then occluded, oDx 95%, p-mDx 60%, AVCFX occluded, oOM 90% with inf branch occluded, ext network of occluded OMs and PLs (fill s/w by collats), p+mRI 60%,, pRCA 40-50%, oAM 80%, EF 55-65%   . Atrial fibrillation     in setting of sepsis in [place]; tx with amiodarone (amio stopped 04/20/11);    Marland Kitchen Injury to liver   . Allergic rhinitis due to pollen   . Hyperlipidemia   . Hypertension   . Vitamin D deficiency   . Anemia, unspecified   . Arthritis   . Diabetes mellitus     borderline  . Diverticulitis of colon with perforation s/p colectomy/ostomy 13Oct2012 02/23/2011  . Carotid stenosis     a.  02/2011 - RICA 80-99% and LICA 60-79%;  b. s/p R CEA Dr. Darrick Penna 09/2011  . Complication of anesthesia     Allergie to narcotics and abx.  Marland Kitchen Hx of echocardiogram     a.  Echo 10/2010 (in setting of NSTEMI at [facility] in [facility]):  mid ot apical septal AK, EF 40-45%, MAC, mild to mod MR, mild TR, no pulmo HTN;  b. Echo 02/26/11: Septal and apical hypokinesis, moderate LVE, EF 45-50%, posterior MAC with bileaflet mitral valve prolapse,  posteriorly directed eccentric MR with moderate MR, mild BAE.  Marland Kitchen PAD (peripheral artery disease)     a. ABIs 9/13: R 0.55; L 0.93;  b. arterial insufficiency ulcers on right foot;  followed by [facility]  . Hypothyroid     Past Surgical History  Procedure Date  . Appendectomy   . Total hip arthroplasty 07/2004    Right  . Colostomy 10/2010  . Cesarean section 1955, 1959, 1960  . Abdominal hysterectomy 1976  . Bladder repair 1976  . Abdominal adhesion surgery     at time of hysterectomy  . Joint replacement 2006    Right  Hip  . Endarterectomy 10/20/2011    Procedure: ENDARTERECTOMY CAROTID;  Surgeon: Sherren Kerns, MD;  Location: [facility] OR;  Service: Vascular;  Laterality: Right;  Right Carotid Endarterectomy with patch angioplasty  . Carotid endarterectomy     Family History  Problem Relation Age of Onset  . Heart disease Mother 79    Pacemaker, No CAD Heart Disease before age 47  . Diverticulitis Mother   . Diabetes Mother   . Hyperlipidemia Mother   . Hypertension Mother   . Lung disease Father 8  Smoking  . Ovarian cancer Daughter   . Cancer Daughter   . Colon cancer Neg Hx   . Heart disease Maternal Grandmother   . Diabetes Maternal Aunt     x 2  . Diabetes Sister   . Heart disease Sister     Heart Disease before age 20  . Hyperlipidemia Sister   . Hypertension Sister     History  Substance Use Topics  . Smoking status: Never Smoker   . Smokeless tobacco: Never Used  . Alcohol Use: No    OB History    Grav Para Term Preterm Abortions TAB SAB Ect Mult Living                  Review of Systems All other systems reviewed and are negative Allergies  Demerol; Codeine; Benzoin; Cephalexin; Cephalosporins; Contrast media; Dilaudid; Insulins; Lisinopril; Morphine and related; Other; Penicillins; Sulfa antibiotics; Valium; Vistaril; Aspirin; and Latex  Home Medications   Current Outpatient Rx  Name  Route  Sig  Dispense  Refill  . ACETAMINOPHEN-CODEINE #3  300-30 MG PO TABS   Oral   Take 1-2 tablets by mouth every 4 (four) hours as needed. For pain         . AMLODIPINE BESYLATE 5 MG PO TABS   Oral   Take 1 tablet (5 mg total) by mouth 2 (two) times daily.   60 tablet   11   . ASPIRIN EC 81 MG PO TBEC   Oral   Take 81 mg by mouth daily.          Marland Kitchen CALCIUM 600 + D PO   Oral   Take 1 tablet by mouth 2 (two) times daily.         Marland Kitchen BENADRYL PO   Oral   Take 30 mLs by mouth as needed. For cough         . EZETIMIBE 10 MG PO TABS   Oral   Take 1 tablet (10 mg total) by mouth daily.   28 tablet   0   . ISOSORBIDE MONONITRATE ER 120 MG PO TB24   Oral   Take 1 tablet (120 mg total) by mouth daily.         Marland Kitchen LEVOTHYROXINE SODIUM 25 MCG PO TABS   Oral   Take 25 mcg by mouth daily.         Marland Kitchen MELATONIN 5 MG PO CAPS   Oral   Take 5 mg by mouth at bedtime as needed. To help sleep         . MSM 1000 MG PO TABS   Oral   Take 1,000 mg by mouth daily.          Marland Kitchen METOPROLOL TARTRATE 25 MG PO TABS   Oral   Take 12.5 mg by mouth 2 (two) times daily.          . ADULT GUMMY PO   Oral   Take 2 tablets by mouth daily.         Marland Kitchen NITROGLYCERIN 0.4 MG SL SUBL   Sublingual   Place 1 tablet (0.4 mg total) under the tongue every 5 (five) minutes x 3 doses as needed. For chest pain   25 tablet   3     BP 119/45  Pulse 64  Temp 98 F (36.7 C) (Oral)  Resp 14  SpO2 98%  Physical Exam  Nursing note and vitals reviewed. Constitutional: She is oriented to person, place, and  time. She appears well-developed and well-nourished. No distress.  HENT:  Head: Normocephalic and atraumatic.  Eyes: Pupils are equal, round, and reactive to light.  Neck: Normal range of motion.  Cardiovascular: Normal rate and intact distal pulses.   No murmur heard. Pulmonary/Chest: No respiratory distress.  Abdominal: Normal appearance. She exhibits no distension.  Musculoskeletal: Normal range of motion.  Neurological: She is alert and  oriented to person, place, and time. No cranial nerve deficit.  Skin: Skin is warm and dry. No rash noted.  Psychiatric: She has a normal mood and affect. Her behavior is normal.    ED Course  Procedures (including critical care time)  Date: 02/27/2012  Rate: 74  Rhythm: normal sinus rhythm  QRS Axis: normal  Intervals: normal  ST/T Wave abnormalities: normal  Conduction Disutrbances: none  Narrative Interpretation: No significant change when compared to previous tracing   Labs Reviewed  POCT I-STAT, CHEM 8 - Abnormal; Notable for the following:    BUN 36 (*)     Glucose, Bld 180 (*)     Hemoglobin 9.9 (*)     HCT 29.0 (*)     All other components within normal limits  POCT I-STAT TROPONIN I - Abnormal; Notable for the following:    Troponin i, poc 0.25 (*)     All other components within normal limits  POCT I-STAT TROPONIN I  HEPARIN LEVEL (UNFRACTIONATED)   Dg Chest Portable 1 View  02/27/2012  *RADIOLOGY REPORT*  Clinical Data: Chest pain  PORTABLE CHEST - 1 VIEW  Comparison: Prior chest x-ray 09/30/2011  Findings: Increased pulmonary vascular congestion without overt edema.  Stable cardiomegaly.  Stable atherosclerotic and tortuous thoracic aorta.  No pneumothorax, pleural effusion or focal airspace opacity.  No acute osseous abnormality.  Calcification projects over the region of the left carotid bifurcation.  IMPRESSION:  1.  Increased pulmonary vascular congestion without overt edema. 2.  Stable mild cardiomegaly 3.  Atherosclerosis including probable left carotid disease   Original Report Authenticated By: Malachy Moan, M.D.      1. Unstable angina   2. NSTEMI (non-ST elevated myocardial infarction)   3. Coronary atherosclerosis of native coronary artery   4. Essential hypertension, benign   5. Myocardial infarction       MDM  CRITICAL CARE Performed by: Nelva Nay L   Total critical care time: 30 min  Critical care time was exclusive of separately  billable procedures and treating other patients.  Critical care was necessary to treat or prevent imminent or life-threatening deterioration.  Critical care was time spent personally by me on the following activities: development of treatment plan with patient and/or surrogate as well as nursing, discussions with consultants, evaluation of patient's response to treatment, examination of patient, obtaining history from patient or surrogate, ordering and performing treatments and interventions, ordering and review of laboratory studies, ordering and review of radiographic studies, pulse oximetry and re-evaluation of patient's condition.         Nelia Shi, MD 02/27/12 1535

## 2012-02-27 NOTE — ED Notes (Signed)
Dr Beaton at bedside 

## 2012-02-27 NOTE — Progress Notes (Signed)
PHARMACIST - PHYSICIAN ORDER COMMUNICATION  CONCERNING: P&T Medication Policy on Herbal Medications  DESCRIPTION:  This patient's order for: Melatonin 5mg  qhs PRN   has been noted.  This product(s) is classified as an "herbal" or natural product. Due to a lack of definitive safety studies or FDA approval, nonstandard manufacturing practices, plus the potential risk of unknown drug-drug interactions while on inpatient medications, the Pharmacy and Therapeutics Committee does not permit the use of "herbal" or natural products of this type within [facility].   ACTION TAKEN: The pharmacy department is unable to verify this order at this time and your patient has been informed of this safety policy. Please reevaluate patient's clinical condition at discharge and address if the herbal or natural product(s) should be resumed at that time.   Bayard Hugger, PharmD, BCPS  Clinical Pharmacist  Pager: [phone]

## 2012-02-27 NOTE — ED Notes (Signed)
Pt seen here yesterday and had arterial procedure by Dr. Darrick Penna, vascular surgeon.  Per EMS, pt has many occlusions that are too advanced for intervention.  Pt today c/o central to left CP described as heaviness that woke pt from sleep.  Pt took her home nitro, Tylenol # 3, and 4 baby aspirin.  Pt took 1st nitro at 0615, 2nd nitro and 1st Tylenol # 3 at 0630.  2nd Tylenol # 3 at 0700.  3rd nitro at 0810.  4 baby aspirin at approximately 0845.

## 2012-02-27 NOTE — ED Notes (Signed)
Julia Greene [phone]

## 2012-02-27 NOTE — H&P (Signed)
Physician History and Physical    Julia Greene MRN: [id number] DOB/AGE: 03/14/1930 79 y.o. Admit date: 02/27/2012  Primary Care Physician: Dr. Glade Lloyd Primary Cardiologist: Dr. Antoine Poche  HPI: 79 yo with history of severe CAD (unrevascularized) with chronic angina, PAF, severe PAD came to the ER with worsened chest pain.  She has a history of severe, unrevascularized CAD with almost daily chest pain (see below history).  Last cath was in 3/13: no PCI options and poor targets for CABG. She has been managed medically.  More recently, she had right CEA in 9/13 and had a peripheral angiogram done yesterday for a nonhealing right foot ulcer + low ABIs.  She had extensive PAD on angiogram yesterday with no good percutaneous option.    At baseline, patient has chest pain episodes every day or two.  She is not clear about triggers.  Often it wakes her up from sleep.  She uses a lot of NTG and a lot of Tylenol#3 (uses up 180 tabs in a month).  She has been seen in the office recent with medication titration.  Yesterday, she had chest pain both before and after her peripheral angiogram.  This resolved with NTG and she was sent home. She woke up this morning with severe substernal chest pressure, much worse than usual for her.  It was 10/10.  It was not resolved with NTG.  She came to the ER.  Initially, cardiac enzymes were negative.  ECG was actually normal.  She was treated with NTG without relief, then Fentanyl with substantial relief.  CP now down to 3/10.  Second set of cardiac enzymes came back with TnI elevated to 0.25.  Of note, she is not on a statin as she has not been able to tolerate statins in the past.  She lives with her son.  At [place], she is not very active.  She has pain in her right foot at the ulcer site (says that the ulcer is healing).   Review of systems complete and found to be negative unless listed above   PMH: 1. Coronary artery disease  a. h/o NSTEMI in [place] of [place]ib[place]se[place]  ([place] b. MV 2/13: EF 57%, mod isch septum and poss small part of the Ant wall; c. LHC 04/06/11: dLM 40%, oLAD 70%, then long 50%, then occluded, oDx 95%, p-mDx 60%, AVCFX occluded, oOM 90% with inf branch occluded, ext network of occluded OMs and PLs (fill s/w by collats), p+mRI 60%,, pRCA 40-50%, oAM 80%, EF 55-60%.  She has been medically managed due to no good PCI options.  She is a high risk surgical candidate and has preferred to avoid CABG.   2. Atrial fibrillation: paroxysmal.  in setting of sepsis in [place]; tx with amiodarone (amio stopped 3/13) 3. Allergic rhinitis due to pollen   4. Hyperlipidemia  5. Hypertension  6. Vitamin D deficiency  7. Anemia, unspecified   8. Arthritis  9. Diabetes mellitus: borderline  10. Diverticulitis of colon with perforation s/p colectomy/ostomy 13Oct2012  11. Carotid stenosis  a. 02/2011 - RICA 80-99% and LICA 60-79%; b. s/p R CEA Dr. Darrick Penna 09/2011  12. Complication of anesthesia  Allergie to narcotics and abx.  13. CHF: Primarily diastolic.  a. Echo 10/2010 (in setting of NSTEMI at [facility] in [facility]): mid ot apical septal AK, EF 40-45%, MAC, mild to mod MR, mild TR, no pulmo HTN; b. Echo 02/26/11: Septal and apical hypokinesis, moderate LVE, EF 45-50%, posterior MAC with bileaflet mitral valve  prolapse, posteriorly directed eccentric MR with moderate MR, mild BAE.  14. Mitral regurgitation: Moderate, see above echo report.  15. PAD (peripheral artery disease)  a. ABIs 9/13: R 0.55; L 0.93; b. arterial insufficiency ulcers on right foot; followed by [facility]; c. peripheral angiogram 1/14 with severe bilateral SFA and popliteal disease, 1 vessel runoff bilaterally.  No PCI options, poor surgical candidate.   16. Hypothyroid    Family History  Problem Relation Age of Onset  . Heart disease Mother 59    Pacemaker, No CAD Heart Disease before age 8  . Diverticulitis Mother   . Diabetes Mother   . Hyperlipidemia Mother   . Hypertension Mother   .  Lung disease Father 92    Smoking  . Ovarian cancer Daughter   . Cancer Daughter   . Colon cancer Neg Hx   . Heart disease Maternal Grandmother   . Diabetes Maternal Aunt     x 2  . Diabetes Sister   . Heart disease Sister     Heart Disease before age 62  . Hyperlipidemia Sister   . Hypertension Sister     History   Social History  . Marital Status: Widowed    Spouse Name: N/A    Number of Children: N/A  . Years of Education: N/A   Occupational History  . Not on file.   Social History Main Topics  . Smoking status: Never Smoker   . Smokeless tobacco: Never Used  . Alcohol Use: No  . Drug Use: No  . Sexually Active: Not on file   Other Topics Concern  . Not on file   Social History Narrative   Lives with      Physical Exam: Blood pressure 127/47, pulse 67, temperature 98 F (36.7 C), temperature source Oral, resp. rate 18, SpO2 95.00%.  General: NAD Neck: No JVD, no thyromegaly or thyroid nodule.  Lungs: Clear to auscultation bilaterally with normal respiratory effort. CV: Nondisplaced PMI.  Heart regular S1/S2, no S3/S4, 2/6 HSM at apex.  No peripheral edema.  Bilateral soft carotid bruits.  Unable to feel pedal pulses.  Abdomen: Soft, nontender, no hepatosplenomegaly, no distention.  Skin: Small ulceration on right 5th toe.  Neurologic: Alert and oriented x 3.  Psych: Normal affect. Extremities: No clubbing or cyanosis.  HEENT: Normal.   Labs:   Lab Results  Component Value Date   WBC 8.5 01/04/2012   HGB 9.9* 02/27/2012   HCT 29.0* 02/27/2012   MCV 91.5 01/04/2012   PLT 230.0 01/04/2012    Lab 02/27/12 0937  NA 139  K 4.7  CL 112  CO2 --  BUN 36*  CREATININE 1.00  CALCIUM --  PROT --  BILITOT --  ALKPHOS --  ALT --  AST --  GLUCOSE 180*   Troponin 0 => 0.25   EKG: NSR, normal  ASSESSMENT AND PLAN: 79 yo with history of severe CAD (unrevascularized) with chronic angina, PAF, severe PAD came to the ER with worsened chest pain and enzyme  evidence for NSTEMI. 1. CAD: Severe.  No PCI options on prior cath.  She would be high-risk for CABG and it appears that this has been decided against.  She has had chronic frequent chest pain.  However, today she had a more severe and more prolonged episode.  ECG is unchanged but troponin mildly elevated, suggestive of NSTEMI. She takes a lot of Tylenol#3 for chest pain at home. - Cycle cardiac enzymes and ECGs.   - Continue  ASA, will add heparin gtt and Plavix.  For now, plan medical treatment of NSTEMI => do not anticipate repeating cardiac cath.  However, will need to review situation with Dr. Antoine Poche who knows her well.  - Will need anginal control: Start NTG gtt (titrate as needed) and add metoprolol.  Can continue home amlodipine.  Fentanyl worked if narcotics are needed. Ultimately, ranolazine will probably be a good medication for her at home.  Would like to try to limit her need for Tylenol #3.  - She has not been able to tolerate statins.  - I will get an echo to reassess EF.  Once angina is controlled, should add ARB.  2. PAD: Severe bilateral SFA and popliteal disease on angiogram yesterday.  No percutaneous option, poor candidate for surgery.  Medical management.  Will get wound consult for foot.  3. Mitral regurgitation: Murmur c/w MR.  Moderate on last echo.  Will get repeat echo to follow.    Signed: Marca Ancona 02/27/2012, 2:23 PM

## 2012-02-27 NOTE — Progress Notes (Signed)
ANTICOAGULATION CONSULT NOTE - Initial Consult  Pharmacy Consult for heparin Indication: chest pain/ACS  Allergies  Allergen Reactions  . Demerol Other (See Comments)    coma  . Codeine Swelling and Rash    Injectable. Can take by mouth.  . Benzoin     coma  . Cephalexin     Turns red  . Cephalosporins     coma  . Contrast Media (Iodinated Diagnostic Agents)     Passed out  . Dilaudid (Hydromorphone Hcl)     coma  . Insulins Swelling  . Lisinopril     cough  . Morphine And Related     coma  . Other     Patient highly sensitive to antibiotics, narcotics and any medication used to put patient to sleep for surgery.  . Penicillins     coma  . Sulfa Antibiotics     coma  . Valium (Diazepam)     cx pt to "climb walls"  . Vistaril (Hydroxyzine Hcl)   . Aspirin     coma  . Latex Rash    Patient Measurements:   Heparin Dosing Weight: 56 kg  Vital Signs: Temp: 98 F (36.7 C) (02/01 0912) Temp src: Oral (02/01 0912) BP: 127/47 mmHg (02/01 1247) Pulse Rate: 67  (02/01 1247)  Labs:  Alvira Philips 02/27/12 0937 02/26/12 0953  HGB 9.9* 10.9*  HCT 29.0* 32.0*  PLT -- --  APTT -- --  LABPROT -- --  INR -- --  HEPARINUNFRC -- --  CREATININE 1.00 1.30*  CKTOTAL -- --  CKMB -- --  TROPONINI -- --    The CrCl is unknown because both a height and weight (above a minimum accepted value) are required for this calculation.   Medical History: Past Medical History  Diagnosis Date  . Coronary artery disease     a. h/o NSTEMI in setting of AFib/se[place] (in [place]  b.  MV 2/13: EF 57%, mod isch septum and poss small part of the Ant wall;  c.  LHC 04/06/11:  dLM 40%, oLAD 70%, then long 50%, then occluded, oDx 95%, p-mDx 60%, AVCFX occluded, oOM 90% with inf branch occluded, ext network of occluded OMs and PLs (fill s/w by collats), p+mRI 60%,, pRCA 40-50%, oAM 80%, EF 55-65%   . Atrial fibrillation     in setting of sepsis in [place]; tx with amiodarone (amio stopped 04/20/11);    Marland Kitchen  Injury to liver   . Allergic rhinitis due to pollen   . Hyperlipidemia   . Hypertension   . Vitamin D deficiency   . Anemia, unspecified   . Arthritis   . Diabetes mellitus     borderline  . Diverticulitis of colon with perforation s/p colectomy/ostomy 13Oct2012 02/23/2011  . Carotid stenosis     a.  02/2011 - RICA 80-99% and LICA 60-79%;  b. s/p R CEA Dr. Darrick Penna 09/2011  . Complication of anesthesia     Allergie to narcotics and abx.  Marland Kitchen Hx of echocardiogram     a.  Echo 10/2010 (in setting of NSTEMI at [facility] in [facility]):  mid ot apical septal AK, EF 40-45%, MAC, mild to mod MR, mild TR, no pulmo HTN;  b. Echo 02/26/11: Septal and apical hypokinesis, moderate LVE, EF 45-50%, posterior MAC with bileaflet mitral valve prolapse, posteriorly directed eccentric MR with moderate MR, mild BAE.  Marland Kitchen PAD (peripheral artery disease)     a. ABIs 9/13: R 0.55; L 0.93;  b. arterial insufficiency ulcers  on right foot;  followed by [facility]  . Hypothyroid     Medications:   (Not in a hospital admission)  Assessment: 79 yo lady presents to ED with CP to start heparin therapy.  She was seen here yesterday for consideration of peripheral bypass surgery and not felt to a surgical candidate. Goal of Therapy:  Heparin level 0.3-0.7 units/ml Monitor platelets by anticoagulation protocol: Yes   Plan:  Heparin 2500 unit bolus and drip at 750 units/hr. Check heparin level and CBC 8 hours after start. Daily HL and CBC while on heparin.  Nakai Pollio Poteet 02/27/2012,2:59 PM

## 2012-02-28 DIAGNOSIS — I059 Rheumatic mitral valve disease, unspecified: Secondary | ICD-10-CM

## 2012-02-28 LAB — COMPREHENSIVE METABOLIC PANEL
Albumin: 3.5 g/dL (ref 3.5–5.2)
BUN: 27 mg/dL — ABNORMAL HIGH (ref 6–23)
Chloride: 107 mEq/L (ref 96–112)
Creatinine, Ser: 0.78 mg/dL (ref 0.50–1.10)
GFR calc Af Amer: 90 mL/min — ABNORMAL LOW (ref >90)
Total Bilirubin: 0.3 mg/dL (ref 0.3–1.2)
Total Protein: 7.1 g/dL (ref 6.0–8.3)

## 2012-02-28 LAB — CBC
HCT: 31.6 % — ABNORMAL LOW (ref 36.0–46.0)
MCHC: 33.2 g/dL (ref 30.0–36.0)
MCV: 87.3 fL (ref 78.0–100.0)
RDW: 14.6 % (ref 11.5–15.5)

## 2012-02-28 LAB — HEPARIN LEVEL (UNFRACTIONATED): Heparin Unfractionated: 0.51 IU/mL (ref 0.30–0.70)

## 2012-02-28 LAB — TROPONIN I: Troponin I: 2.4 ng/mL (ref <0.30)

## 2012-02-28 MED ORDER — RANOLAZINE ER 500 MG PO TB12
500.0000 mg | ORAL_TABLET | Freq: Two times a day (BID) | ORAL | Status: DC
  Administered 2012-02-28 – 2012-03-01 (×5): 500 mg via ORAL
  Filled 2012-02-28 (×6): qty 1

## 2012-02-28 MED ORDER — ISOSORBIDE MONONITRATE ER 60 MG PO TB24
120.0000 mg | ORAL_TABLET | Freq: Every day | ORAL | Status: DC
  Administered 2012-02-28 – 2012-02-29 (×2): 120 mg via ORAL
  Filled 2012-02-28 (×2): qty 2

## 2012-02-28 NOTE — Progress Notes (Signed)
Nurse observed and slightly assisted as Pt changed her own ostomy appliance and cleansed her stoma.

## 2012-02-28 NOTE — Progress Notes (Signed)
ANTICOAGULATION CONSULT NOTE - Follow Up Consult  Pharmacy Consult for heparin Indication: chest pain/ACS  Labs:  Basename 02/28/12 0017 02/27/12 1811 02/27/12 0937 02/26/12 0953  HGB -- -- 9.9* 10.9*  HCT -- -- 29.0* 32.0*  PLT -- -- -- --  APTT -- -- -- --  LABPROT -- -- -- --  INR -- -- -- --  HEPARINUNFRC 0.51 -- -- --  CREATININE -- -- 1.00 1.30*  CKTOTAL -- -- -- --  CKMB -- -- -- --  TROPONINI 2.40* 0.65* -- --    Assessment/Plan:  79yo female therapeutic on heparin with initial dosing for ACS.  Will continue gtt at current rate and confirm stable with next CE.  Colleen Can PharmD BCPS 02/28/2012,2:03 AM

## 2012-02-28 NOTE — Progress Notes (Signed)
ANTICOAGULATION CONSULT NOTE - Follow Up Consult  Pharmacy Consult for Heparin Indication: chest pain/ACS  Allergies  Allergen Reactions  . Demerol Other (See Comments)    coma  . Codeine Swelling and Rash    Injectable. Can take by mouth.  . Benzoin     coma  . Cephalexin     Turns red  . Cephalosporins     coma  . Contrast Media (Iodinated Diagnostic Agents)     Passed out  . Dilaudid (Hydromorphone Hcl)     coma  . Insulins Swelling  . Lisinopril     cough  . Morphine And Related     coma  . Other     Patient highly sensitive to antibiotics, narcotics and any medication used to put patient to sleep for surgery.  . Penicillins     coma  . Sulfa Antibiotics     coma  . Valium (Diazepam)     cx pt to "climb walls"  . Vistaril (Hydroxyzine Hcl)   . Aspirin     coma  . Latex Rash    Patient Measurements: Height: 5\' 2"  (157.5 cm) Weight: 137 lb 2 oz (62.2 kg) IBW/kg (Calculated) : 50.1  Heparin Dosing Weight: 56 kg  Vital Signs: Temp: 97.9 F (36.6 C) (02/02 0751) Temp src: Oral (02/02 0751) BP: 128/56 mmHg (02/02 0600) Pulse Rate: 58  (02/02 0317)  Labs:  Basename 02/28/12 0525 02/28/12 0017 02/27/12 1811 02/27/12 0937 02/26/12 0953  HGB 10.5* -- -- 9.9* --  HCT 31.6* -- -- 29.0* 32.0*  PLT 242 -- -- -- --  APTT -- -- -- -- --  LABPROT -- -- -- -- --  INR -- -- -- -- --  HEPARINUNFRC 0.56 0.51 -- -- --  CREATININE 0.78 -- -- 1.00 1.30*  CKTOTAL -- -- -- -- --  CKMB -- -- -- -- --  TROPONINI 2.48* 2.40* 0.65* -- --    Estimated Creatinine Clearance: 49.4 ml/min (by C-G formula based on Cr of 0.78).   Medications:  Scheduled:    . amLODipine  5 mg Oral BID  . [COMPLETED] aspirin  324 mg Oral NOW   Or  . [COMPLETED] aspirin  300 mg Rectal NOW  . aspirin EC  81 mg Oral Daily  . Chlorhexidine Gluconate Cloth  6 each Topical Q0600  . [COMPLETED] clopidogrel  600 mg Oral Once  . clopidogrel  75 mg Oral Q breakfast  . ezetimibe  10 mg Oral  Daily  . [COMPLETED] fentaNYL  50 mcg Intravenous Once  . [COMPLETED] heparin  2,500 Units Intravenous Once  . [COMPLETED] HYDROcodone-acetaminophen  1 tablet Oral Once  . levothyroxine  25 mcg Oral Daily  . metoprolol tartrate  25 mg Oral Q6H  . mupirocin ointment  1 application Nasal BID  . sodium chloride  3 mL Intravenous Q12H  . [DISCONTINUED] aspirin EC  81 mg Oral Daily   Infusions:    . heparin 750 Units/hr (02/27/12 2000)  . nitroGLYCERIN 20 mcg/min (02/27/12 2000)    Assessment: 79 y/o female receiving heparin for chest pain/ACS. She was seen here yesterday for consideration of peripheral bypass surgery and not felt to be a surgical candidate. Heparin level remains therapeutic on 750 units/hr. No bleeding noted.  Goal of Therapy:  Heparin level 0.3-0.7 units/ml Monitor platelets by anticoagulation protocol: Yes   Plan:  Continue heparin infusion at 750 units/hr Check heparin level daily while on heparin Continue to monitor H&H and platelets  Judeth Cornfield  Willeen Cass, PharmD Clinical Pharmacist 02/28/2012 8:29 AM

## 2012-02-28 NOTE — Consult Note (Signed)
WOC consult Note Reason for Consult: eval for topical wound care for the right 5th toe ulceration. Pt with known hx of PAD, not candidate for any further revascularization.  Pt reported this was a dark area and she used cream at home until that dark area came off, she reported that "then there was a hole there".   Wound type: arterial ulcer of the right 5th toe  Measurement: 0.5cm x 0.5cm x 0.2cm  Wound bed:100% yellow-white slough Drainage (amount, consistency, odor) minimal, no odor Periwound: intact Dressing procedure/placement/frequency:intially I was going to order Santyl (enzymatic debridement ointment) to clean up the slough in the wound bed however the pt request me not to order this, she has used it in the past and it caused her to have pain and the surrounding tissue to be sore.  For this reason will stick with hydrogel to keep tissue soft, may be able to have some outpatient debridement if slough loosens.     Re consult if needed, will not follow at this time. Thanks  Tameeka Luo [employer], CWOCN [phone])

## 2012-02-28 NOTE — Progress Notes (Signed)
Patient ID: Julia Greene, female   DOB: 1930-12-05, 79 y.o.   MRN: [id number]    SUBJECTIVE: Chest pain has resolved on IV NTG.  TnI to 2.48.  Feels good this morning.      Marland Kitchen amLODipine  5 mg Oral BID  . aspirin EC  81 mg Oral Daily  . Chlorhexidine Gluconate Cloth  6 each Topical Q0600  . clopidogrel  75 mg Oral Q breakfast  . ezetimibe  10 mg Oral Daily  . isosorbide mononitrate  120 mg Oral Daily  . levothyroxine  25 mcg Oral Daily  . metoprolol tartrate  25 mg Oral Q6H  . mupirocin ointment  1 application Nasal BID  . ranolazine  500 mg Oral BID  . sodium chloride  3 mL Intravenous Q12H      Filed Vitals:   02/28/12 0320 02/28/12 0400 02/28/12 0600 02/28/12 0751  BP:  110/52 128/56   Pulse:      Temp: 96.4 F (35.8 C)   97.9 F (36.6 C)  TempSrc: Oral   Oral  Resp:    16  Height:      Weight: 137 lb 2 oz (62.2 kg)     SpO2: 98% 97% 98%     Intake/Output Summary (Last 24 hours) at 02/28/12 1022 Last data filed at 02/28/12 0600  Gross per 24 hour  Intake    476 ml  Output   2300 ml  Net  -1824 ml    LABS: Basic Metabolic Panel:  Basename 02/28/12 0525 02/27/12 0937  NA 138 139  K 4.6 4.7  CL 107 112  CO2 22 --  GLUCOSE 128* 180*  BUN 27* 36*  CREATININE 0.78 1.00  CALCIUM 9.5 --  MG -- --  PHOS -- --   Liver Function Tests:  Basename 02/28/12 0525  AST 20  ALT 7  ALKPHOS 53  BILITOT 0.3  PROT 7.1  ALBUMIN 3.5   No results found for this basename: LIPASE:2,AMYLASE:2 in the last 72 hours CBC:  Basename 02/28/12 0525 02/27/12 0937  WBC 13.4* --  NEUTROABS -- --  HGB 10.5* 9.9*  HCT 31.6* 29.0*  MCV 87.3 --  PLT 242 --   Cardiac Enzymes:  Basename 02/28/12 0525 02/28/12 0017 02/27/12 1811  CKTOTAL -- -- --  CKMB -- -- --  CKMBINDEX -- -- --  TROPONINI 2.48* 2.40* 0.65*   BNP: No components found with this basename: POCBNP:3 D-Dimer: No results found for this basename: DDIMER:2 in the last 72 hours Hemoglobin A1C: No results  found for this basename: HGBA1C in the last 72 hours Fasting Lipid Panel: No results found for this basename: CHOL,HDL,LDLCALC,TRIG,CHOLHDL,LDLDIRECT in the last 72 hours Thyroid Function Tests:  Basename 02/27/12 1811  TSH 0.758  T4TOTAL --  T3FREE --  THYROIDAB --   Anemia Panel: No results found for this basename: VITAMINB12,FOLATE,FERRITIN,TIBC,IRON,RETICCTPCT in the last 72 hours  RADIOLOGY: Dg Chest Portable 1 View  02/27/2012  *RADIOLOGY REPORT*  Clinical Data: Chest pain  PORTABLE CHEST - 1 VIEW  Comparison: Prior chest x-ray 09/30/2011  Findings: Increased pulmonary vascular congestion without overt edema.  Stable cardiomegaly.  Stable atherosclerotic and tortuous thoracic aorta.  No pneumothorax, pleural effusion or focal airspace opacity.  No acute osseous abnormality.  Calcification projects over the region of the left carotid bifurcation.  IMPRESSION:  1.  Increased pulmonary vascular congestion without overt edema. 2.  Stable mild cardiomegaly 3.  Atherosclerosis including probable left carotid disease   Original Report Authenticated  By: Malachy Moan, M.D.     PHYSICAL EXAM General: NAD Neck: JVP 8 cm, no thyromegaly or thyroid nodule.  Lungs: Clear to auscultation bilaterally with normal respiratory effort. CV: Nondisplaced PMI.  Heart regular S1/S2, no S3/S4, 2/6 HSM apex.  No peripheral edema.   Abdomen: Soft, nontender, no hepatosplenomegaly, no distention.  Neurologic: Alert and oriented x 3.  Psych: Normal affect. Extremities: No clubbing or cyanosis.   TELEMETRY: Reviewed telemetry pt in NSR  ASSESSMENT AND PLAN: 79 yo with history of severe CAD (unrevascularized) with chronic angina, PAF, severe PAD came to the ER with worsened chest pain and enzyme evidence for NSTEMI.  1. CAD: Severe. No PCI options on prior cath. She would be high-risk for CABG and it appears that this has been decided against. She has had chronic frequent chest pain. However, yesterday  she had a more severe and more prolonged episode. ECG was unchanged but troponin elevated to 2.48, suggestive of NSTEMI. Of note, she takes a lot of Tylenol#3 for chest pain at home. .  - Continue ASA.  Added Plavix for medical management of NSTEMI.  Would continue heparin gtt x 48 hrs. For now, plan medical treatment of NSTEMI => do not anticipate repeating cardiac cath. However, will need to review situation with Dr. Antoine Poche who knows her well.  - Will need anginal control: Transition back to po Imdur today.  I have increased her metoprolol and will add ranolazine 500 mg bid. Can continue home amlodipine.  Would like to try to limit her need for Tylenol #3.  ECG in am after starting ranolazine.  - She has not been able to tolerate statins.  - I will get an echo to reassess EF. Would consider addition of ARB eventually.  2. PAD: Severe bilateral SFA and popliteal disease on angiogram yesterday. No percutaneous option, poor candidate for surgery. Medical management. Will get wound consult for foot.  3. Mitral regurgitation: Murmur c/w MR. Moderate on last echo. Will get repeat echo to follow.   Marca Ancona 02/28/2012 10:25 AM

## 2012-02-28 NOTE — Progress Notes (Signed)
  Echocardiogram 2D Echocardiogram has been performed.  Julia Greene 02/28/2012, 11:54 AM

## 2012-02-29 DIAGNOSIS — I200 Unstable angina: Secondary | ICD-10-CM

## 2012-02-29 LAB — BASIC METABOLIC PANEL
CO2: 23 mEq/L (ref 19–32)
Calcium: 9.4 mg/dL (ref 8.4–10.5)
Creatinine, Ser: 0.93 mg/dL (ref 0.50–1.10)
Glucose, Bld: 142 mg/dL — ABNORMAL HIGH (ref 70–99)

## 2012-02-29 LAB — CBC
HCT: 33.3 % — ABNORMAL LOW (ref 36.0–46.0)
Hemoglobin: 11.1 g/dL — ABNORMAL LOW (ref 12.0–15.0)
MCH: 29.1 pg (ref 26.0–34.0)
MCHC: 33.3 g/dL (ref 30.0–36.0)

## 2012-02-29 MED ORDER — NITROGLYCERIN 0.4 MG/HR TD PT24
0.4000 mg | MEDICATED_PATCH | Freq: Every day | TRANSDERMAL | Status: DC
  Administered 2012-02-29 – 2012-03-01 (×2): 0.4 mg via TRANSDERMAL
  Filled 2012-02-29 (×2): qty 1

## 2012-02-29 MED ORDER — ACETAMINOPHEN-CODEINE #3 300-30 MG PO TABS
1.0000 | ORAL_TABLET | ORAL | Status: DC | PRN
  Administered 2012-02-29 (×2): 1 via ORAL
  Filled 2012-02-29 (×2): qty 1

## 2012-02-29 NOTE — Clinical Social Work Psychosocial (Signed)
     Clinical Social Work Department BRIEF PSYCHOSOCIAL ASSESSMENT 02/29/2012  Patient:  Julia Greene, Julia Greene     Account Number:  [id number]     Admit date:  02/27/2012  Clinical Social Worker:  Hulan Fray  Date/Time:  02/29/2012 02:34 PM  Referred by:  RN  Date Referred:  02/29/2012 Referred for  Other - See comment   Other Referral:   "Pt express  concerns about her finance which is presently handled by her son."   Interview type:  Patient Other interview type:    PSYCHOSOCIAL DATA Living Status:  ALONE Admitted from facility:   Level of care:   Primary support name:  Julina Altmann Primary support relationship to patient:  CHILD, ADULT Degree of support available:   adequate    CURRENT CONCERNS Current Concerns  Other - See comment   Other Concerns:   Financial concern    SOCIAL WORK ASSESSMENT / PLAN Clinical Social Worker received referral for a concern patient has regarding son's handling of her finances. CSW introduced self and explained reason for visit. Patient reported that she was upset with her son, Lonni Fix last night because he would not bring her purse to the hospital. Patient explained that she had medication, tylenol #3, that she keeps in her purse, just in case she needs it, and her son was concerned she would take the medication and believed it is damaging for her health. Patient reported she spoke with MD regarding the medication and spoke with her son regarding that conversation she had with the MD. Patient reported that she has everything under control and has been in touch with an attorney, Wayne Both. Patient began speaking about her late husband that that he was in the [employer] and how that experience being a military wife changed her life. Patient also discussed her children and how they are in ministry. Patient began discussing her relationship with God and how her faith is getting her through. Patient reported that she does not have any further  concern regarding her finances because she is working to regain back the control of her finances.   Assessment/plan status:  No Further Intervention Required Other assessment/ plan:   Information/referral to community resources:   No needs    PATIENTS/FAMILYS RESPONSE TO PLAN OF CARE: Patient reported that she feels like she has her situation under control. Patient is working with a Clinical research associate regarding her financial situation.

## 2012-02-29 NOTE — Progress Notes (Signed)
ANTICOAGULATION CONSULT NOTE - Follow Up Consult  Pharmacy Consult for Heparin Indication: chest pain/ACS  Allergies  Allergen Reactions  . Demerol Other (See Comments)    coma  . Codeine Swelling and Rash    Injectable. Can take by mouth.  . Benzoin     coma  . Cephalexin     Turns red  . Cephalosporins     coma  . Contrast Media (Iodinated Diagnostic Agents)     Passed out  . Dilaudid (Hydromorphone Hcl)     coma  . Insulins Swelling  . Lisinopril     cough  . Morphine And Related     coma  . Other     Patient highly sensitive to antibiotics, narcotics and any medication used to put patient to sleep for surgery.  . Penicillins     coma  . Sulfa Antibiotics     coma  . Valium (Diazepam)     cx pt to "climb walls"  . Vistaril (Hydroxyzine Hcl)   . Aspirin     coma  . Latex Rash    Patient Measurements: Height: 5\' 2"  (157.5 cm) Weight: 135 lb 2.3 oz (61.3 kg) IBW/kg (Calculated) : 50.1  Heparin Dosing Weight: 56 kg  Vital Signs: Temp: 97.7 F (36.5 C) (02/03 1145) Temp src: Oral (02/03 1145) BP: 140/92 mmHg (02/03 1145) Pulse Rate: 68  (02/03 1145)  Labs:  Basename 02/29/12 1610 02/28/12 0909 02/28/12 0525 02/28/12 0017 02/27/12 1811 02/27/12 0937  HGB 11.1* -- 10.5* -- -- --  HCT 33.3* -- 31.6* -- -- 29.0*  PLT 246 -- 242 -- -- --  APTT -- -- -- -- -- --  LABPROT -- 14.1 -- -- -- --  INR -- 1.10 -- -- -- --  HEPARINUNFRC 0.39 -- 0.56 0.51 -- --  CREATININE 0.93 -- 0.78 -- -- 1.00  CKTOTAL -- -- -- -- -- --  CKMB -- -- -- -- -- --  TROPONINI -- -- 2.48* 2.40* 0.65* --    Estimated Creatinine Clearance: 42.3 ml/min (by C-G formula based on Cr of 0.93).   Medications:  Scheduled:     . amLODipine  5 mg Oral BID  . aspirin EC  81 mg Oral Daily  . Chlorhexidine Gluconate Cloth  6 each Topical Q0600  . clopidogrel  75 mg Oral Q breakfast  . ezetimibe  10 mg Oral Daily  . levothyroxine  25 mcg Oral Daily  . metoprolol tartrate  25 mg Oral Q6H   . mupirocin ointment  1 application Nasal BID  . nitroGLYCERIN  0.4 mg Transdermal Daily  . ranolazine  500 mg Oral BID  . sodium chloride  3 mL Intravenous Q12H  . [DISCONTINUED] isosorbide mononitrate  120 mg Oral Daily   Infusions:     . heparin 750 Units/hr (02/29/12 0800)    Assessment: 79 y/o female receiving heparin for chest pain/ACS. Heparin level remains therapeutic on 750 units/hr. No bleeding noted. Patient noted for 48hrs of heparin  Goal of Therapy:  Heparin level 0.3-0.7 units/ml Monitor platelets by anticoagulation protocol: Yes   Plan:  -Continue heparin infusion at 750 units/hr -Check heparin level daily while on heparin -Continue to monitor H&H and platelets  Harland German, Pharm D 02/29/2012 2:13 PM

## 2012-02-29 NOTE — Progress Notes (Signed)
   SUBJECTIVE:  Ambulated with rehab.  No chest pain.  Off of IV NTG.  She feels better   PHYSICAL EXAM Filed Vitals:   02/29/12 0600 02/29/12 0800 02/29/12 1000 02/29/12 1145  BP: 140/59 117/47 125/52 140/92  Pulse:  59  68  Temp: 98.3 F (36.8 C) 97.9 F (36.6 C)  97.7 F (36.5 C)  TempSrc: Oral Oral  Oral  Resp:      Height:      Weight:      SpO2: 99% 96% 96% 95%   General:  No distress Lungs:  Clear Heart:  RRR Abdomen:  Positive bowel sounds, no rebound no guarding Extremities:  No edema.   LABS: Lab Results  Component Value Date   CKTOTAL 43 10/01/2011   CKMB 2.2 10/01/2011   TROPONINI 2.48* 02/28/2012   Results for orders placed during the hospital encounter of 02/27/12 (from the past 24 hour(s))  CBC     Status: Abnormal   Collection Time   02/29/12  6:23 AM      Component Value Range   WBC 10.9 (*) 4.0 - 10.5 K/uL   RBC 3.81 (*) 3.87 - 5.11 MIL/uL   Hemoglobin 11.1 (*) 12.0 - 15.0 g/dL   HCT 57.8 (*) 46.9 - 62.9 %   MCV 87.4  78.0 - 100.0 fL   MCH 29.1  26.0 - 34.0 pg   MCHC 33.3  30.0 - 36.0 g/dL   RDW 52.8  41.3 - 24.4 %   Platelets 246  150 - 400 K/uL  HEPARIN LEVEL (UNFRACTIONATED)     Status: Normal   Collection Time   02/29/12  6:23 AM      Component Value Range   Heparin Unfractionated 0.39  0.30 - 0.70 IU/mL  BASIC METABOLIC PANEL     Status: Abnormal   Collection Time   02/29/12  6:23 AM      Component Value Range   Sodium 134 (*) 135 - 145 mEq/L   Potassium 4.9  3.5 - 5.1 mEq/L   Chloride 102  96 - 112 mEq/L   CO2 23  19 - 32 mEq/L   Glucose, Bld 142 (*) 70 - 99 mg/dL   BUN 27 (*) 6 - 23 mg/dL   Creatinine, Ser 0.10  0.50 - 1.10 mg/dL   Calcium 9.4  8.4 - 27.2 mg/dL   GFR calc non Af Amer 57 (*) >90 mL/min   GFR calc Af Amer 66 (*) >90 mL/min    Intake/Output Summary (Last 24 hours) at 02/29/12 1210 Last data filed at 02/29/12 0800  Gross per 24 hour  Intake  652.5 ml  Output   2050 ml  Net -1397.5 ml    ECHO:  EF 45 - 50%.  No change  from previous.    ASSESSMENT AND PLAN:  CAD:  Ranexa added this admission.  She is not a revascularization candidate.  She is concerned because the pills are coming out in her colostomy.  I suspect that this is because some of them are SR and the tablets will not dissolve.  I will investigate.  However, she will have more peace of mind if we switch to NTG patch and stop the Imdur.   Cardiomyopathy:  EF unchanged.  She seems to be euvolemic.  Continue current therapy.   Fayrene Fearing [facility] 02/29/2012 12:10 PM

## 2012-02-29 NOTE — Progress Notes (Addendum)
CARDIAC REHAB PHASE I   PRE:  Rate/Rhythm: 67 SR    BP: sitting 138/51    SaO2: 97 RA  MODE:  Ambulation: 120 ft   POST:  Rate/Rhythm: 78 SR    BP: sitting 131/115, 140/92     SaO2: 99 RA  Pt ambulated short distance with RW. Sts she could go further, that her house is bigger than that. No CP off IV NTG. Sts she normally gets CP going up and down stair multiple times. BP seemingly very elevated after walk . Pt to chair, declined education, stating she knows everything. Very talkative. Seems to be doing well.  [id number]  Harriet Masson CES, ACSM

## 2012-03-01 ENCOUNTER — Telehealth: Payer: Self-pay | Admitting: Cardiology

## 2012-03-01 DIAGNOSIS — I214 Non-ST elevation (NSTEMI) myocardial infarction: Secondary | ICD-10-CM

## 2012-03-01 DIAGNOSIS — I739 Peripheral vascular disease, unspecified: Secondary | ICD-10-CM

## 2012-03-01 LAB — CBC
HCT: 33 % — ABNORMAL LOW (ref 36.0–46.0)
Hemoglobin: 11.1 g/dL — ABNORMAL LOW (ref 12.0–15.0)
MCH: 29.1 pg (ref 26.0–34.0)
MCHC: 33.6 g/dL (ref 30.0–36.0)
MCV: 86.6 fL (ref 78.0–100.0)
Platelets: 237 10*3/uL (ref 150–400)
RBC: 3.81 MIL/uL — ABNORMAL LOW (ref 3.87–5.11)
RDW: 14.3 % (ref 11.5–15.5)
WBC: 11.3 10*3/uL — ABNORMAL HIGH (ref 4.0–10.5)

## 2012-03-01 MED ORDER — METOPROLOL TARTRATE 50 MG PO TABS: 50.0000 mg | ORAL_TABLET | Freq: Two times a day (BID) | ORAL | Status: DC

## 2012-03-01 MED ORDER — RANOLAZINE ER 500 MG PO TB12: 500.0000 mg | ORAL_TABLET | Freq: Two times a day (BID) | ORAL | Status: DC

## 2012-03-01 MED ORDER — CLOPIDOGREL BISULFATE 75 MG PO TABS: 75.0000 mg | ORAL_TABLET | Freq: Every day | ORAL | Status: DC

## 2012-03-01 MED ORDER — NITROGLYCERIN 0.4 MG/HR TD PT24: 1.0000 | MEDICATED_PATCH | Freq: Every day | TRANSDERMAL | Status: DC

## 2012-03-01 NOTE — Discharge Summary (Signed)
Patient ID: Julia Greene,  MRN: [id number], DOB/AGE: 06/30/1930 79 y.o.  Admit date: 02/27/2012 Discharge date: 03/01/2012  Primary Cardiologist: J. Bonnetta Allbee, MD Patient seen and examined.  Plan as discussed in my rounding note for today and outlined below. Rollene Rotunda  03/01/2012  4:17 PM    Discharge Diagnoses Principal Problem:  *NSTEMI (non-ST elevated myocardial infarction)  **Medically managed. Active Problems:  Coronary artery disease - severe  PAD (peripheral artery disease) - severe  HTN (hypertension)  Hyperlipidemia  Mitral regurgitation  Diverticulitis of colon with perforation s/p colectomy/ostomy 13Oct2012  Foot ulcer, right  Peripheral arterial disease  Allergies Allergies  Allergen Reactions  . Demerol Other (See Comments)    coma  . Codeine Swelling and Rash    Injectable. Can take by mouth.  . Benzoin     coma  . Cephalexin     Turns red  . Cephalosporins     coma  . Contrast Media (Iodinated Diagnostic Agents)     Passed out  . Dilaudid (Hydromorphone Hcl)     coma  . Insulins Swelling  . Lisinopril     cough  . Morphine And Related     coma  . Other     Patient highly sensitive to antibiotics, narcotics and any medication used to put patient to sleep for surgery.  . Penicillins     coma  . Sulfa Antibiotics     coma  . Valium (Diazepam)     cx pt to "climb walls"  . Vistaril (Hydroxyzine Hcl)   . Aspirin     coma  . Latex Rash   Procedures  2D Echocardiogram 02/28/2012  Study Conclusions  - Left ventricle: The cavity size was normal. There was mild   concentric hypertrophy. Systolic function was mildly   reduced. The estimated ejection fraction was in the range   of 45% to 50%. Severe hypokinesis and thinningof the   mid-distal anteroseptal, basilar inferior and basilar   inferolateral myocardium; consistent with infarction. - Aortic valve: Mildly to moderately calcified annulus.   Trileaflet; mildly thickened, mildly calcified  leaflets.   Cusp separation was mildly reduced. - Mitral valve: Calcified annulus. Moderately thickened,   moderately calcified leaflets . Mild to moderate   regurgitation. - Left atrium: The atrium was mildly dilated. - Atrial septum: No defect or patent foramen ovale was   identified. _____________  History of Present Illness  79 year old female with known severe coronary artery disease and peripheral arterial disease.  She has chronic chest pain and frequently uses sublingual nitroglycerin along with Tylenol No. 3 (up to 180 tablets per month). On January 31, she underwent peripheral angiography and was noted to have chest discomfort both before and after the procedure which was nitrate responsive. On the morning of February 1, she had recurrent, more severe chest pain and presented to the [facility]. ECG was nonacute while initial troponin was normal. Subsequent troponin returned elevated at 0.25. She was initiated on heparin therapy and admitted for further evaluation.  Hospital Course  Patient ruled in for non-ST segment elevation myocardial infarction, eventually peaking her troponin at 2.48. Given her known severe coronary artery disease with relatively recent catheterization in March 2013 showing poor targets for PCI or bypass grafting, decision was made to pursue aggressive medical therapy. Her beta blocker was titrated and Plavix as well as Ranexa were added to her regimen. She was kept on heparin for 48 hours. She did have intermittent nitrate-responsive chest discomfort and her  imdur was switched to a nitroglycerin patch for better absorption as she noted full pills in her ostomy bag.  With this change, she had improvement in chest pain and has been ambulating with cardiac rehab without difficulty.  She will be discharged home today in good condition.  Discharge Vitals Blood pressure 140/51, pulse 70, temperature 98.4 F (36.9 C), temperature source Oral, resp. rate 16, height 5'  2" (1.575 m), weight 132 lb 6.4 oz (60.056 kg), SpO2 98.00%.  Filed Weights   02/29/12 0329 02/29/12 0500 03/01/12 0300  Weight: 135 lb 2.3 oz (61.3 kg) 135 lb 2.3 oz (61.3 kg) 132 lb 6.4 oz (60.056 kg)   Labs  CBC  Basename 03/01/12 0525 02/29/12 0623  WBC 11.3* 10.9*  NEUTROABS -- --  HGB 11.1* 11.1*  HCT 33.0* 33.3*  MCV 86.6 87.4  PLT 237 246   Basic Metabolic Panel  Basename 02/29/12 0623 02/28/12 0525  NA 134* 138  K 4.9 4.6  CL 102 107  CO2 23 22  GLUCOSE 142* 128*  BUN 27* 27*  CREATININE 0.93 0.78  CALCIUM 9.4 9.5  MG -- --  PHOS -- --   Liver Function Tests  Basename 02/28/12 0525  AST 20  ALT 7  ALKPHOS 53  BILITOT 0.3  PROT 7.1  ALBUMIN 3.5   Cardiac Enzymes  Basename 02/28/12 0525 02/28/12 0017 02/27/12 1811  CKTOTAL -- -- --  CKMB -- -- --  CKMBINDEX -- -- --  TROPONINI 2.48* 2.40* 0.65*   Thyroid Function Tests  Basename 02/27/12 1811  TSH 0.758  T4TOTAL --  T3FREE --  THYROIDAB --   Disposition  Pt is being discharged home today in good condition.  Follow-up Plans & Appointments  Follow-up Information    Follow up with Tereso Newcomer, PA. On 03/07/2012. (12:10 PM - Dr. Jenene Slicker PA)    Contact information:   [address] [place] [address] [phone]       Follow up with Oneal Grout, MD. (as scheduled.)    Contact information:   [address] [facility] [place] [address] [phone]        Discharge Medications    Medication List     As of 03/01/2012  1:59 PM    STOP taking these medications         isosorbide mononitrate 120 MG 24 hr tablet   Commonly known as: IMDUR      TAKE these medications         acetaminophen-codeine 300-30 MG per tablet   Commonly known as: TYLENOL #3   Take 1-2 tablets by mouth every 4 (four) hours as needed. For pain      ADULT GUMMY PO   Take 2 tablets by mouth daily.      amLODipine 5 MG tablet   Commonly known as: NORVASC   Take 1  tablet (5 mg total) by mouth 2 (two) times daily.      aspirin EC 81 MG tablet   Take 81 mg by mouth daily.      BENADRYL PO   Take 30 mLs by mouth as needed. For cough      CALCIUM 600 + D PO   Take 1 tablet by mouth 2 (two) times daily.      clopidogrel 75 MG tablet   Commonly known as: PLAVIX   Take 1 tablet (75 mg total) by mouth daily with breakfast.      ezetimibe 10 MG  tablet   Commonly known as: ZETIA   Take 1 tablet (10 mg total) by mouth daily.      levothyroxine 25 MCG tablet   Commonly known as: SYNTHROID, LEVOTHROID   Take 25 mcg by mouth daily.      Melatonin 5 MG Caps   Take 5 mg by mouth at bedtime as needed. To help sleep      metoprolol 50 MG tablet   Commonly known as: LOPRESSOR   Take 1 tablet (50 mg total) by mouth 2 (two) times daily.      MSM 1000 MG Tabs   Take 1,000 mg by mouth daily.      nitroGLYCERIN 0.4 MG SL tablet   Commonly known as: NITROSTAT   Place 1 tablet (0.4 mg total) under the tongue every 5 (five) minutes x 3 doses as needed. For chest pain      nitroGLYCERIN 0.4 mg/hr   Commonly known as: NITRODUR - Dosed in mg/24 hr   Place 1 patch (0.4 mg total) onto the skin daily.      ranolazine 500 MG 12 hr tablet   Commonly known as: RANEXA   Take 1 tablet (500 mg total) by mouth 2 (two) times daily.      Outstanding Labs/Studies  None  Duration of Discharge Encounter   Greater than 30 minutes including physician time.   Signed, Nicolasa Ducking NP 03/01/2012, 1:59 PM

## 2012-03-01 NOTE — Progress Notes (Signed)
   SUBJECTIVE:  No further chest pain.  No SOB.     PHYSICAL EXAM Filed Vitals:   02/29/12 2223 03/01/12 0118 03/01/12 0300 03/01/12 0448  BP: 114/52 138/60  128/62  Pulse: 60 60  63  Temp:    97.7 F (36.5 C)  TempSrc:      Resp:    16  Height:      Weight:   132 lb 6.4 oz (60.056 kg)   SpO2:    96%   General:  No distress Lungs:  Clear Heart:  RRR Abdomen:  Positive bowel sounds, no rebound no guarding Extremities:  No edema.   LABS:  Results for orders placed during the hospital encounter of 02/27/12 (from the past 24 hour(s))  CBC     Status: Abnormal   Collection Time   03/01/12  5:25 AM      Component Value Range   WBC 11.3 (*) 4.0 - 10.5 K/uL   RBC 3.81 (*) 3.87 - 5.11 MIL/uL   Hemoglobin 11.1 (*) 12.0 - 15.0 g/dL   HCT 16.1 (*) 09.6 - 04.5 %   MCV 86.6  78.0 - 100.0 fL   MCH 29.1  26.0 - 34.0 pg   MCHC 33.6  30.0 - 36.0 g/dL   RDW 40.9  81.1 - 91.4 %   Platelets 237  150 - 400 K/uL  HEPARIN LEVEL (UNFRACTIONATED)     Status: Normal   Collection Time   03/01/12  5:25 AM      Component Value Range   Heparin Unfractionated 0.36  0.30 - 0.70 IU/mL    Intake/Output Summary (Last 24 hours) at 03/01/12 7829 Last data filed at 02/29/12 1500  Gross per 24 hour  Intake    850 ml  Output      0 ml  Net    850 ml    ASSESSMENT AND PLAN:  CAD:  Ranexa added this admission.  I switched to NTG patch yesterday.  I will stop heparin and ambulated.  Home later this morning.    Cardiomyopathy:  EF unchanged.  She seems to be euvolemic.  Continue current therapy.   Fayrene Fearing [facility] 03/01/2012 6:59 AM

## 2012-03-01 NOTE — Telephone Encounter (Signed)
Per [facility]m B call pt has been scheduled for appt on 2/10 this is a transitional care pt/lgt

## 2012-03-01 NOTE — Progress Notes (Signed)
Reviewed all discharge instructions with patient including appointments, medications, activity, diet, when to call provider, when to call 911, importance of washing hands, and all other discharge instructions. IV's removed, copy of discharge instructions given to patient.

## 2012-03-01 NOTE — Progress Notes (Signed)
CARDIAC REHAB PHASE I   PRE:  Rate/Rhythm: 68 SR    BP: sitting 124/62    SaO2:   MODE:  Ambulation: 600 ft   POST:  Rate/Rhythm: 82 SR    BP: sitting 142/80     SaO2:   Tolerated very well using RW. Denied CP/sx. No leg pain either. Good distance. To recliner. Encouraged walking as tolerated at home. Pt declined CRPII due to hassle (does not drive). Knows to keep NTG fresh. [id number]  Harriet Masson CES, ACSM

## 2012-03-02 NOTE — Telephone Encounter (Signed)
Spoke with pt's daughter in Social worker. Pt got home from the hospital yesterday, and is doing well. PT GOT ALL HER MEDICATION . PT IS AWARE of her appointmwent with Tereso Newcomer PA on 03/07/12 at 12:10 pm.

## 2012-03-03 ENCOUNTER — Telehealth: Payer: Self-pay | Admitting: Cardiology

## 2012-03-03 DIAGNOSIS — W19XXXA Unspecified fall, initial encounter: Secondary | ICD-10-CM

## 2012-03-03 DIAGNOSIS — Y92009 Unspecified place in unspecified non-institutional (private) residence as the place of occurrence of the external cause: Secondary | ICD-10-CM

## 2012-03-03 HISTORY — DX: Unspecified place in unspecified non-institutional (private) residence as the place of occurrence of the external cause: Y92.009

## 2012-03-03 HISTORY — DX: Unspecified fall, initial encounter: W19.XXXA

## 2012-03-03 NOTE — Telephone Encounter (Signed)
Called and spoke with daughter.  Reviewed meds and answered all questions  Pt will follow up at appt on Monday

## 2012-03-03 NOTE — Telephone Encounter (Signed)
New Problem    Pt was just released from hospital and was given a number of new medications. Pt's mom wanted to review ins and outs of new medications. Would like to speak to nurse.

## 2012-03-04 NOTE — Progress Notes (Signed)
UR Completed.  Kahliya Fraleigh Makaia [phone] 03/04/2012  

## 2012-03-07 ENCOUNTER — Ambulatory Visit (INDEPENDENT_AMBULATORY_CARE_PROVIDER_SITE_OTHER): Payer: Medicare Other | Admitting: Physician Assistant

## 2012-03-07 ENCOUNTER — Encounter: Payer: Self-pay | Admitting: Physician Assistant

## 2012-03-07 VITALS — BP 128/62 | HR 53 | Ht 62.0 in | Wt 137.8 lb

## 2012-03-07 DIAGNOSIS — I739 Peripheral vascular disease, unspecified: Secondary | ICD-10-CM

## 2012-03-07 DIAGNOSIS — I2510 Atherosclerotic heart disease of native coronary artery without angina pectoris: Secondary | ICD-10-CM

## 2012-03-07 DIAGNOSIS — I10 Essential (primary) hypertension: Secondary | ICD-10-CM

## 2012-03-07 DIAGNOSIS — R110 Nausea: Secondary | ICD-10-CM

## 2012-03-07 NOTE — Progress Notes (Signed)
[address] [place], [place]  [address] Phone: [phone], Fax:  [phone]  Date:  03/07/2012   ID:  Julia Greene, DOB 05-02-1930, MRN [id number]  PCP:  Oneal Grout, MD  Primary Cardiologist:  Dr. Rollene Rotunda     History of Present Illness: Julia Greene is a 79 y.o. female who returns for follow up after recent admission to the hospital for NSTEMI.  She established with Dr. Antoine Poche 01/2011 after moving here from [facility]. She had a NSTEMI in the setting of atrial fibrillation and perforated diverticulum with peritoneal abscess. She had preserved LVF and mitral regurgitation noted. Myoview demonstrated septal and anterior ischemia. Cardiac cath demonstrated severe diffuse three-vessel coronary artery disease. She had no high grade lesions amenable to PCI and surgical revascularization would be very incomplete. Medical Rx recommended. She was noted to have a high grade RICA stenosis. She was admitted 9/13 with chest discomfort. Cath films were reviewed again. Medical therapy continued to be recommended and she was placed on nitrates.  Patient underwent right CEA by Dr. Darrick Penna 10/20/11.  Recently saw Dr. Darrick Penna in follow up for PAD with assoc right foot ulcers.     She was admitted 2/1-2/4. She noted chest discomfort before and after her recent peripheral angiogram on 02/26/12. She had more severe chest pain on 02/27/12 and presented to the ED. She non-STEMI with peak troponin of 2.48. Given her prior history of known severe CAD with poor targets for PCI or CABG, aggressive medical therapy was recommended. Plavix and Ranexa was added to her medical regimen.  Beta blocker was titrated. Isosorbide was switched to nitroglycerin patch. Symptoms improved and she was discharged to home.  She is doing well.  No further chest pain.  No dyspnea.  No orthopnea, PND, edema.  She has noted some nausea recently.  Her daughter notes several family members with gastroenteritis.  No diarrhea.   No fevers.  She does have some occasional lightheadedness.    Labs (9/13):   K 4.8, creatinine 0.76, ALT 8, LDL 154, Hgb 8.6, TSH 12.011 Labs (12/13): K 4.5, creatinine 1.0, Hgb 9.9, TSH 12.011 (T4 0.77, T3 2.4) Labs (2/14):   K 4.9, creatinine 0.93, ALT 7, Hgb 11.1, TSH 0.758  Wt Readings from Last 3 Encounters:  03/07/12 137 lb 12.8 oz (62.506 kg)  03/01/12 132 lb 6.4 oz (60.056 kg)  02/26/12 123 lb (55.792 kg)     Past Medical History  Diagnosis Date  . Coronary artery disease     a. h/o NSTEMI in setting of [place]ib/se[place] ([place]  b.  MV 2/13: EF 57%, mod isch septum and poss small part of the Ant wall;  c.  LHC 04/06/11:  dLM 40%, oLAD 70%, then long 50%, then occluded, oDx 95%, p-mDx 60%, AVCFX occluded, oOM 90% with inf branch occluded, ext network of occluded OMs and PLs (fill s/w by collats), p+mRI 60%,, pRCA 40-50%, oAM 80%, EF 55-65%   . Atrial fibrillation     in setting of sepsis in [place]; tx with amiodarone (amio stopped 04/20/11);    Marland Kitchen Injury to liver   . Allergic rhinitis due to pollen   . Hyperlipidemia   . Hypertension   . Vitamin D deficiency   . Anemia, unspecified   . Arthritis   . Diabetes mellitus     borderline  . Diverticulitis of colon with perforation s/p colectomy/ostomy 13Oct2012 02/23/2011  . Carotid stenosis     a.  02/2011 - RICA  80-99% and LICA 60-79%;  b. s/p R CEA Dr. Darrick Penna 09/2011  . Complication of anesthesia     Allergie to narcotics and abx.  Marland Kitchen Hx of echocardiogram     a.  Echo 10/2010 (in setting of NSTEMI at [facility] in [facility]):  mid ot apical septal AK, EF 40-45%, MAC, mild to mod MR, mild TR, no pulmo HTN;  b. Echo 02/26/11: Septal and apical hypokinesis, moderate LVE, EF 45-50%, posterior MAC with bileaflet mitral valve prolapse, posteriorly directed eccentric MR with moderate MR, mild BAE.  Marland Kitchen PAD (peripheral artery disease)     a. ABIs 9/13: R 0.55; L 0.93;  b. arterial insufficiency ulcers on right foot;  followed by [facility];  c. angiogram  01/2012 with severe bilat SFA and pop disease => not good bypass candidate and PTA not an option => prob continue conservative mgmt  . Hypothyroid     Current Outpatient Prescriptions  Medication Sig Dispense Refill  . acetaminophen-codeine (TYLENOL #3) 300-30 MG per tablet Take 1-2 tablets by mouth every 4 (four) hours as needed. For pain      . amLODipine (NORVASC) 5 MG tablet Take 1 tablet (5 mg total) by mouth 2 (two) times daily.  60 tablet  11  . aspirin EC 81 MG tablet Take 81 mg by mouth daily.       . Calcium Carbonate-Vitamin D (CALCIUM 600 + D PO) Take 1 tablet by mouth 2 (two) times daily.      . clopidogrel (PLAVIX) 75 MG tablet Take 1 tablet (75 mg total) by mouth daily with breakfast.  30 tablet  6  . DiphenhydrAMINE HCl (BENADRYL PO) Take 30 mLs by mouth as needed. For cough      . ezetimibe (ZETIA) 10 MG tablet Take 1 tablet (10 mg total) by mouth daily.  28 tablet  0  . levothyroxine (SYNTHROID, LEVOTHROID) 25 MCG tablet Take 25 mcg by mouth daily.      . Melatonin 5 MG CAPS Take 5 mg by mouth at bedtime as needed. To help sleep      . Methylsulfonylmethane (MSM) 1000 MG TABS Take 1,000 mg by mouth daily.       . metoprolol tartrate (LOPRESSOR) 50 MG tablet Take 1 tablet (50 mg total) by mouth 2 (two) times daily.  60 tablet  6  . Multiple Vitamins-Minerals (ADULT GUMMY PO) Take 2 tablets by mouth daily.      . nitroGLYCERIN (NITRODUR - DOSED IN MG/24 HR) 0.4 mg/hr Place 1 patch (0.4 mg total) onto the skin daily.  30 patch  6  . nitroGLYCERIN (NITROSTAT) 0.4 MG SL tablet Place 1 tablet (0.4 mg total) under the tongue every 5 (five) minutes x 3 doses as needed. For chest pain  25 tablet  3  . ranolazine (RANEXA) 500 MG 12 hr tablet Take 1 tablet (500 mg total) by mouth 2 (two) times daily.  60 tablet  6  . [DISCONTINUED] amiodarone (PACERONE) 200 MG tablet Take 200 mg by mouth daily.       No current facility-administered medications for this visit.    Allergies:      Allergies  Allergen Reactions  . Demerol Other (See Comments)    coma  . Codeine Swelling and Rash    Injectable. Can take by mouth.  . Benzoin     coma  . Cephalexin     Turns red  . Cephalosporins     coma  . Contrast Media (Iodinated Diagnostic  Agents)     Passed out  . Dilaudid (Hydromorphone Hcl)     coma  . Insulins Swelling  . Lisinopril     cough  . Morphine And Related     coma  . Other     Patient highly sensitive to antibiotics, narcotics and any medication used to put patient to sleep for surgery.  . Penicillins     coma  . Sulfa Antibiotics     coma  . Valium (Diazepam)     cx pt to "climb walls"  . Vistaril (Hydroxyzine Hcl)   . Aspirin     coma  . Latex Rash    Social History:  The patient  reports that she has never smoked. She has never used smokeless tobacco. She reports that she does not drink alcohol or use illicit drugs.   ROS:  Please see the history of present illness.   She denies melena, hematochezia.   All other systems reviewed and negative.   PHYSICAL EXAM: VS:  BP 128/62  Pulse 53  Ht 5\' 2"  (1.575 m)  Wt 137 lb 12.8 oz (62.506 kg)  BMI 25.2 kg/m2 Well nourished, well developed, in no acute distress HEENT: normal Neck: no JVD Cardiac:  normal S1, S2; RRR; 2/6 systolic ejection murmur heard best along the left sternal border Lungs:  clear to auscultation bilaterally, no wheezing, rhonchi or rales Abd: soft, nontender, no hepatomegaly Ext: no edema Skin: warm and dry; ulcer noted on the lateral aspect of the right fifth toe Neuro:  CNs 2-12 intact, no focal abnormalities noted  EKG:  Sinus brady, HR 53, normal axis, no acute changes.    ASSESSMENT AND PLAN:  1. Coronary Artery Disease:  Stable after recent non-STEMI. Continue aspirin, Plavix, beta blocker, nitroglycerin patch and Ranexa.  If lightheadedness persists, consider decreasing beta blocker dose.  2. Hypertension: Controlled.  3. Peripheral Arterial Disease:  Managed by  vascular surgery. 4. Hyperlipidemia:  Continue Zetia 5. Nausea:  May be related to viral gastroenteritis.  I offered a Rx for Zofran.  She wants to hold off.  She will call if symptoms persist. 6. Disposition:  Followup with me in 3 weeks and Dr. Antoine Poche in 3 months.  Luna Glasgow, PA-C  12:59 PM 03/07/2012

## 2012-03-07 NOTE — Patient Instructions (Addendum)
Your physician recommends that you schedule a follow-up appointment in: 3 weeks with  [facility]  Your physician recommends that you continue on your current medications as directed. Please refer to the Current Medication list given to you today.

## 2012-03-25 ENCOUNTER — Other Ambulatory Visit: Payer: Self-pay | Admitting: *Deleted

## 2012-03-25 DIAGNOSIS — M7989 Other specified soft tissue disorders: Secondary | ICD-10-CM

## 2012-03-28 ENCOUNTER — Ambulatory Visit (INDEPENDENT_AMBULATORY_CARE_PROVIDER_SITE_OTHER): Payer: Medicare Other | Admitting: Physician Assistant

## 2012-03-28 ENCOUNTER — Encounter: Payer: Self-pay | Admitting: Physician Assistant

## 2012-03-28 VITALS — BP 138/58 | HR 59 | Ht 62.0 in | Wt 137.4 lb

## 2012-03-28 DIAGNOSIS — I2510 Atherosclerotic heart disease of native coronary artery without angina pectoris: Secondary | ICD-10-CM

## 2012-03-28 DIAGNOSIS — I10 Essential (primary) hypertension: Secondary | ICD-10-CM

## 2012-03-28 NOTE — Progress Notes (Signed)
[address] [place], [place]  [address] Phone: [phone], Fax:  [phone]  Date:  03/28/2012   ID:  Julia Greene, DOB 02-03-1930, MRN [id number]  PCP:  Oneal Grout, MD  Primary Cardiologist:  Dr. Rollene Rotunda     History of Present Illness: Julia Greene is a 79 y.o. female who returns for follow up.  She established with Dr. Antoine Poche 01/2011 after moving here from [facility]. She had a NSTEMI in the setting of atrial fibrillation and perforated diverticulum with peritoneal abscess. She had preserved LVF and mitral regurgitation noted. Myoview demonstrated septal and anterior ischemia. Cardiac cath demonstrated severe diffuse three-vessel coronary artery disease. She had no high grade lesions amenable to PCI and surgical revascularization would be very incomplete. Medical Rx recommended. She was noted to have a high grade RICA stenosis. Admitted 9/13 with chest discomfort. Cath films were reviewed again and medical therapy continued to be recommended.  She was placed on nitrates.  Patient underwent right CEA by Dr. Darrick Penna 10/20/11.    She was admitted early last month with chest discomfort and ruled in for a non-STEMI with peak troponin of 2.48.  Medical Rx was continued. Plavix and Ranexa were added to her medical regimen.  Beta blocker was titrated. Isosorbide was switched to NTG patch.  I saw her 03/07/12 and she was doing better aside from some nausea and lightheadedness.  Doing better today.  No chest pain.  Notes NYHA Class IIb dyspnea.  No orthopnea, PND.  Has some LLE edema and PCP is getting a venous doppler.  No syncope.    Labs (9/13):   K 4.8, creatinine 0.76, ALT 8, LDL 154, Hgb 8.6, TSH 12.011 Labs (12/13): K 4.5, creatinine 1.0, Hgb 9.9, TSH 12.011 (T4 0.77, T3 2.4) Labs (2/14):   K 4.9, creatinine 0.93, ALT 7, Hgb 11.1, TSH 0.758  Wt Readings from Last 3 Encounters:  03/28/12 137 lb 6.4 oz (62.324 kg)  03/07/12 137 lb 12.8 oz (62.506 kg)  03/01/12 132  lb 6.4 oz (60.056 kg)     Past Medical History  Diagnosis Date  . Coronary artery disease     a. h/o NSTEMI in setting of AFib/se[place] ([place]  b.  MV 2/13: EF 57%, mod isch septum and poss small part of the Ant wall;  c.  LHC 04/06/11:  dLM 40%, oLAD 70%, then long 50%, then occluded, oDx 95%, p-mDx 60%, AVCFX occluded, oOM 90% with inf branch occluded, ext network of occluded OMs and PLs (fill s/w by collats), p+mRI 60%,, pRCA 40-50%, oAM 80%, EF 55-65%   . Atrial fibrillation     in setting of sepsis in [place]; tx with amiodarone (amio stopped 04/20/11);    Marland Kitchen Injury to liver   . Allergic rhinitis due to pollen   . Hyperlipidemia   . Hypertension   . Vitamin D deficiency   . Anemia, unspecified   . Arthritis   . Diabetes mellitus     borderline  . Diverticulitis of colon with perforation s/p colectomy/ostomy 13Oct2012 02/23/2011  . Carotid stenosis     a.  02/2011 - RICA 80-99% and LICA 60-79%;  b. s/p R CEA Dr. Darrick Penna 09/2011  . Complication of anesthesia     Allergie to narcotics and abx.  Marland Kitchen Hx of echocardiogram     a.  Echo 10/2010 (in setting of NSTEMI at [facility]):  mid ot apical septal AK, EF 40-45%, MAC, mild to mod MR,  mild TR, no pulmo HTN;  b. Echo 02/26/11: Septal and apical hypokinesis, moderate LVE, EF 45-50%, posterior MAC with bileaflet mitral valve prolapse, posteriorly directed eccentric MR with moderate MR, mild BAE.  Marland Kitchen PAD (peripheral artery disease)     a. ABIs 9/13: R 0.55; L 0.93;  b. arterial insufficiency ulcers on right foot;  followed by [facility];  c. angiogram 01/2012 with severe bilat SFA and pop disease => not good bypass candidate and PTA not an option => prob continue conservative mgmt  . Hypothyroid     Current Outpatient Prescriptions  Medication Sig Dispense Refill  . acetaminophen-codeine (TYLENOL #3) 300-30 MG per tablet Take 1-2 tablets by mouth every 4 (four) hours as needed. For pain      . amLODipine (NORVASC) 5 MG tablet Take 1 tablet (5  mg total) by mouth 2 (two) times daily.  60 tablet  11  . aspirin EC 81 MG tablet Take 81 mg by mouth daily.       . Calcium Carbonate-Vitamin D (CALCIUM 600 + D PO) Take 1 tablet by mouth 2 (two) times daily.      . clopidogrel (PLAVIX) 75 MG tablet Take 1 tablet (75 mg total) by mouth daily with breakfast.  30 tablet  6  . DiphenhydrAMINE HCl (BENADRYL PO) Take 30 mLs by mouth as needed. For cough      . ezetimibe (ZETIA) 10 MG tablet Take 1 tablet (10 mg total) by mouth daily.  28 tablet  0  . levothyroxine (SYNTHROID, LEVOTHROID) 25 MCG tablet Take 25 mcg by mouth daily.      . Melatonin 5 MG CAPS Take 5 mg by mouth at bedtime as needed. To help sleep      . metFORMIN (GLUCOPHAGE) 500 MG tablet Take 500 mg by mouth daily.      . Methylsulfonylmethane (MSM) 1000 MG TABS Take 1,000 mg by mouth daily.       . metoprolol tartrate (LOPRESSOR) 50 MG tablet Take 1 tablet (50 mg total) by mouth 2 (two) times daily.  60 tablet  6  . Multiple Vitamins-Minerals (ADULT GUMMY PO) Take 2 tablets by mouth daily.      . nitroGLYCERIN (NITRODUR - DOSED IN MG/24 HR) 0.4 mg/hr Place 1 patch (0.4 mg total) onto the skin daily.  30 patch  6  . nitroGLYCERIN (NITROSTAT) 0.4 MG SL tablet Place 1 tablet (0.4 mg total) under the tongue every 5 (five) minutes x 3 doses as needed. For chest pain  25 tablet  3  . ranolazine (RANEXA) 500 MG 12 hr tablet Take 1 tablet (500 mg total) by mouth 2 (two) times daily.  60 tablet  6  . [DISCONTINUED] amiodarone (PACERONE) 200 MG tablet Take 200 mg by mouth daily.       No current facility-administered medications for this visit.    Allergies:    Allergies  Allergen Reactions  . Demerol Other (See Comments)    coma  . Codeine Swelling and Rash    Injectable. Can take by mouth.  . Benzoin     coma  . Cephalexin     Turns red  . Cephalosporins     coma  . Contrast Media (Iodinated Diagnostic Agents)     Passed out  . Dilaudid (Hydromorphone Hcl)     coma  .  Insulins Swelling  . Lisinopril     cough  . Morphine And Related     coma  . Other  Patient highly sensitive to antibiotics, narcotics and any medication used to put patient to sleep for surgery.  . Penicillins     coma  . Sulfa Antibiotics     coma  . Valium (Diazepam)     cx pt to "climb walls"  . Vistaril (Hydroxyzine Hcl)   . Aspirin     coma  . Latex Rash    Social History:  The patient  reports that she has never smoked. She has never used smokeless tobacco. She reports that she does not drink alcohol or use illicit drugs.    PHYSICAL EXAM: VS:  BP 138/58  Pulse 59  Ht 5\' 2"  (1.575 m)  Wt 137 lb 6.4 oz (62.324 kg)  BMI 25.12 kg/m2 Well nourished, well developed, in no acute distress HEENT: normal Neck: no JVD Cardiac:  normal S1, S2; RRR; 2/6 systolic ejection murmur heard best along the left sternal border Lungs:  clear to auscultation bilaterally, no wheezing, rhonchi or rales Abd: soft, nontender, no hepatomegaly Ext: no edema Skin: warm and dry; ulcer noted on the lateral aspect of the right fifth toe Neuro:  CNs 2-12 intact, no focal abnormalities noted  EKG:  Sinus brady, HR 59, no change from prior tracing.    ASSESSMENT AND PLAN:  1. Coronary Artery Disease:  Stable.  Continue aspirin, Plavix, beta blocker, nitroglycerin patch and Ranexa.   2. Hypertension: Controlled.  3. Peripheral Arterial Disease:  Managed by vascular surgery. 4. Hyperlipidemia:  Continue Zetia 5. Disposition:  Followup with Dr. Antoine Poche in April as planned.  Signed, Tereso Newcomer, PA-C  10:41 AM 03/28/2012

## 2012-03-28 NOTE — Patient Instructions (Addendum)
NO CHANGES WERE MADE TODAY  PLEASE KEEP APPT WITH DR. [facility] 05/23/12

## 2012-03-30 ENCOUNTER — Encounter: Payer: Self-pay | Admitting: Neurosurgery

## 2012-03-31 ENCOUNTER — Telehealth: Payer: Self-pay | Admitting: Physician Assistant

## 2012-03-31 ENCOUNTER — Encounter (INDEPENDENT_AMBULATORY_CARE_PROVIDER_SITE_OTHER): Payer: Medicare Other | Admitting: *Deleted

## 2012-03-31 ENCOUNTER — Encounter: Payer: Self-pay | Admitting: Neurosurgery

## 2012-03-31 ENCOUNTER — Ambulatory Visit (INDEPENDENT_AMBULATORY_CARE_PROVIDER_SITE_OTHER): Payer: Medicare Other | Admitting: Neurosurgery

## 2012-03-31 VITALS — BP 127/59 | HR 58 | Temp 97.1°F | Resp 16 | Ht 62.0 in | Wt 134.0 lb

## 2012-03-31 DIAGNOSIS — R110 Nausea: Secondary | ICD-10-CM

## 2012-03-31 DIAGNOSIS — I739 Peripheral vascular disease, unspecified: Secondary | ICD-10-CM

## 2012-03-31 DIAGNOSIS — M7989 Other specified soft tissue disorders: Secondary | ICD-10-CM

## 2012-03-31 DIAGNOSIS — I10 Essential (primary) hypertension: Secondary | ICD-10-CM

## 2012-03-31 DIAGNOSIS — I2510 Atherosclerotic heart disease of native coronary artery without angina pectoris: Secondary | ICD-10-CM

## 2012-03-31 DIAGNOSIS — I70219 Atherosclerosis of native arteries of extremities with intermittent claudication, unspecified extremity: Secondary | ICD-10-CM

## 2012-03-31 DIAGNOSIS — M79609 Pain in unspecified limb: Secondary | ICD-10-CM

## 2012-03-31 MED ORDER — EZETIMIBE 10 MG PO TABS: 10.0000 mg | ORAL_TABLET | Freq: Every day | ORAL | Status: DC

## 2012-03-31 NOTE — Telephone Encounter (Signed)
New problem   Pt's daughter in law stated Lorin Picket had gave pt some Zeta 10mg  to try to see if it would help her. Daughter in law said if  Lorin Picket would call a prescription  in the [facility] phone # [phone]. Please call concerning this matter

## 2012-03-31 NOTE — Telephone Encounter (Signed)
Durward Mallard, pts daughter in law, advised that RX was faxed to [facility] on [address], she verbalized understanding.

## 2012-03-31 NOTE — Progress Notes (Signed)
[facility] [place] [facility] Office Note  CC: Evaluation of DVT in left lower extremity Referring Physician: Fields  History of Present Illness: 79 year old female patient of Dr. Darrick Penna who is referred back to our office from her primary care due to complaints of left leg swelling during a visit with her last week. The patient states she did have a fall mid February injuring her left leg and developed swelling a couple weeks later. The patient denies any redness or pain with walking. She does have a history of poor arterial flow study by Dr. Darrick Penna with an aortogram and bilateral lower extremity runoff 02/26/2012 was found to be a poor candidate for revascularization.  Past Medical History  Diagnosis Date  . Coronary artery disease     a. h/o NSTEMI in setting of AFib/sepsis (in [place]  b.  MV 2/13: EF 57%, mod isch septum and poss small part of the Ant wall;  c.  LHC 04/06/11:  dLM 40%, oLAD 70%, then long 50%, then occluded, oDx 95%, p-mDx 60%, AVCFX occluded, oOM 90% with inf branch occluded, ext network of occluded OMs and PLs (fill s/w by collats), p+mRI 60%,, pRCA 40-50%, oAM 80%, EF 55-65%   . Atrial fibrillation     in setting of sepsis in [place]; tx with amiodarone (amio stopped 04/20/11);    Marland Kitchen Injury to liver   . Allergic rhinitis due to pollen   . Hyperlipidemia   . Hypertension   . Vitamin D deficiency   . Anemia, unspecified   . Arthritis   . Diabetes mellitus     borderline  . Diverticulitis of colon with perforation s/p colectomy/ostomy 13Oct2012 02/23/2011  . Carotid stenosis     a.  02/2011 - RICA 80-99% and LICA 60-79%;  b. s/p R CEA Dr. Darrick Penna 09/2011  . Complication of anesthesia     Allergie to narcotics and abx.  Marland Kitchen Hx of echocardiogram     a.  Echo 10/2010 (in setting of NSTEMI at [facility] in [facility]):  mid ot apical septal AK, EF 40-45%, MAC, mild to mod MR, mild TR, no pulmo HTN;  b. Echo 02/26/11: Septal and apical hypokinesis, moderate LVE, EF  45-50%, posterior MAC with bileaflet mitral valve prolapse, posteriorly directed eccentric MR with moderate MR, mild BAE.  Marland Kitchen PAD (peripheral artery disease)     a. ABIs 9/13: R 0.55; L 0.93;  b. arterial insufficiency ulcers on right foot;  followed by [facility];  c. angiogram 01/2012 with severe bilat SFA and pop disease => not good bypass candidate and PTA not an option => prob continue conservative mgmt  . Hypothyroid   . Myocardial infarction Feb. 1, 2014  . Fall at home Feb. 6, 2014    Left leg     ROS: [x]  Positive   [ ]  Denies    General: [ ]  Weight loss, [ ]  Fever, [ ]  chills Neurologic: [ ]  Dizziness, [ ]  Blackouts, [ ]  Seizure [ ]  Stroke, [ ]  "Mini stroke", [ ]  Slurred speech, [ ]  Temporary blindness; [ ]  weakness in arms or legs, [ ]  Hoarseness Cardiac: [ ]  Chest pain/pressure, [ ]  Shortness of breath at rest [ ]  Shortness of breath with exertion, [ ]  Atrial fibrillation or irregular heartbeat Vascular: [ ]  Pain in legs with walking, [ ]  Pain in legs at rest, [ ]  Pain in legs at night,  [ ]  Non-healing ulcer, [ ]  Blood clot in vein/DVT,   Pulmonary: [ ]  Home  oxygen, [ ]  Productive cough, [ ]  Coughing up blood, [ ]  Asthma,  [ ]  Wheezing Musculoskeletal:  [ ]  Arthritis, [ ]  Low back pain, [ ]  Joint pain Hematologic: [ ]  Easy Bruising, [ ]  Anemia; [ ]  Hepatitis Gastrointestinal: [ ]  Blood in stool, [ ]  Gastroesophageal Reflux/heartburn, [ ]  Trouble swallowing Urinary: [ ]  chronic Kidney disease, [ ]  on HD - [ ]  MWF or [ ]  TTHS, [ ]  Burning with urination, [ ]  Difficulty urinating Skin: [ ]  Rashes, [ ]  Wounds Psychological: [ ]  Anxiety, [ ]  Depression   Social History History  Substance Use Topics  . Smoking status: Never Smoker   . Smokeless tobacco: Never Used  . Alcohol Use: No    Family History Family History  Problem Relation Age of Onset  . Heart disease Mother 40    Pacemaker, No CAD Heart Disease before age 21  . Diverticulitis Mother   . Diabetes Mother   .  Hyperlipidemia Mother   . Hypertension Mother   . Lung disease Father 77    Smoking  . Ovarian cancer Daughter   . Cancer Daughter   . Colon cancer Neg Hx   . Heart disease Maternal Grandmother   . Diabetes Maternal Aunt     x 2  . Diabetes Sister   . Heart disease Sister     Heart Disease before age 38  . Hyperlipidemia Sister   . Hypertension Sister     Allergies  Allergen Reactions  . Demerol Other (See Comments)    coma  . Codeine Swelling and Rash    Injectable. Can take by mouth.  . Benzoin     coma  . Cephalexin     Turns red  . Cephalosporins     coma  . Contrast Media (Iodinated Diagnostic Agents)     Passed out  . Dilaudid (Hydromorphone Hcl)     coma  . Insulins Swelling  . Lisinopril     cough  . Morphine And Related     coma  . Other     Patient highly sensitive to antibiotics, narcotics and any medication used to put patient to sleep for surgery.  . Penicillins     coma  . Sulfa Antibiotics     coma  . Valium (Diazepam)     cx pt to "climb walls"  . Vistaril (Hydroxyzine Hcl)   . Aspirin     coma  . Latex Rash    Current Outpatient Prescriptions  Medication Sig Dispense Refill  . acetaminophen-codeine (TYLENOL #3) 300-30 MG per tablet Take 1-2 tablets by mouth every 4 (four) hours as needed. For pain      . amLODipine (NORVASC) 5 MG tablet Take 1 tablet (5 mg total) by mouth 2 (two) times daily.  60 tablet  11  . aspirin EC 81 MG tablet Take 81 mg by mouth daily.       . Calcium Carbonate-Vitamin D (CALCIUM 600 + D PO) Take 1 tablet by mouth 2 (two) times daily.      . clopidogrel (PLAVIX) 75 MG tablet Take 1 tablet (75 mg total) by mouth daily with breakfast.  30 tablet  6  . DiphenhydrAMINE HCl (BENADRYL PO) Take 30 mLs by mouth as needed. For cough      . ezetimibe (ZETIA) 10 MG tablet Take 1 tablet (10 mg total) by mouth daily.  30 tablet  2  . levothyroxine (SYNTHROID, LEVOTHROID) 25 MCG tablet Take 25 mcg by  mouth daily.      .  Melatonin 5 MG CAPS Take 5 mg by mouth at bedtime as needed. To help sleep      . metFORMIN (GLUCOPHAGE) 500 MG tablet Take 500 mg by mouth daily.      . Methylsulfonylmethane (MSM) 1000 MG TABS Take 1,000 mg by mouth daily.       . metoprolol tartrate (LOPRESSOR) 50 MG tablet Take 1 tablet (50 mg total) by mouth 2 (two) times daily.  60 tablet  6  . Multiple Vitamins-Minerals (ADULT GUMMY PO) Take 2 tablets by mouth daily.      . nitroGLYCERIN (NITRODUR - DOSED IN MG/24 HR) 0.4 mg/hr Place 1 patch (0.4 mg total) onto the skin daily.  30 patch  6  . nitroGLYCERIN (NITROSTAT) 0.4 MG SL tablet Place 1 tablet (0.4 mg total) under the tongue every 5 (five) minutes x 3 doses as needed. For chest pain  25 tablet  3  . ranolazine (RANEXA) 500 MG 12 hr tablet Take 1 tablet (500 mg total) by mouth 2 (two) times daily.  60 tablet  6  . [DISCONTINUED] amiodarone (PACERONE) 200 MG tablet Take 200 mg by mouth daily.       No current facility-administered medications for this visit.    Physical Examination  Filed Vitals:   03/31/12 1234  BP: 127/59  Pulse: 58  Temp: 97.1 F (36.2 C)  Resp: 16    Body mass index is 24.5 kg/(m^2).  General:  WDWN in NAD Gait: Normal HEENT: WNL Eyes: Pupils equal Pulmonary: normal non-labored breathing , without Rales, rhonchi,  wheezing Cardiac: RRR, without  Murmurs, rubs or gallops; No carotid bruits Abdomen: soft, NT, no masses Skin: no rashes, ulcers noted Vascular Exam/Pulses: Lower extremity pulses are not palpable, the patient does have a slow to heal ulceration on her right fifth toe laterally for which she is caring for herself. There is some redness around this area but no drainage.  Extremities without ischemic changes, no Gangrene , no cellulitis; no open wounds;  Musculoskeletal: no muscle wasting or atrophy  Neurologic: A&O X 3; Appropriate Affect ; SENSATION: normal; MOTOR FUNCTION:  moving all extremities equally. Speech is  fluent/normal  Non-Invasive Vascular Imaging: Left lower extremity duplex that showed no sign of DVT or thrombophlebitis  ASSESSMENT/PLAN: Negative for DVT a left lower extremity, the patient will followup with Dr. Darrick Penna as scheduled in April for other issues. The patient's questions were encouraged and answered, she is in agreement with this plan.  Lauree Chandler ANP  Clinic M.D.: Fields

## 2012-04-05 ENCOUNTER — Telehealth: Payer: Self-pay | Admitting: Cardiology

## 2012-04-05 NOTE — Telephone Encounter (Signed)
New Problem:    Patient's daughter called in stating that the patient's pharmacy claims that they did not receive the refill request for the patient's ezetimibe (ZETIA) 10 MG tablet.

## 2012-04-06 ENCOUNTER — Other Ambulatory Visit: Payer: Self-pay | Admitting: *Deleted

## 2012-04-06 DIAGNOSIS — I2510 Atherosclerotic heart disease of native coronary artery without angina pectoris: Secondary | ICD-10-CM

## 2012-04-06 MED ORDER — EZETIMIBE 10 MG PO TABS: 10.0000 mg | ORAL_TABLET | Freq: Every day | ORAL | Status: AC

## 2012-04-06 NOTE — Telephone Encounter (Signed)
s/w pharmacist at [facility] and called in rx for zetia, pharmacist will call pt to let know when ready

## 2012-04-21 ENCOUNTER — Other Ambulatory Visit: Payer: Self-pay | Admitting: *Deleted

## 2012-04-21 DIAGNOSIS — I2510 Atherosclerotic heart disease of native coronary artery without angina pectoris: Secondary | ICD-10-CM

## 2012-04-21 DIAGNOSIS — R609 Edema, unspecified: Secondary | ICD-10-CM

## 2012-04-21 DIAGNOSIS — E138 Other specified diabetes mellitus with unspecified complications: Secondary | ICD-10-CM

## 2012-04-21 DIAGNOSIS — IMO0002 Reserved for concepts with insufficient information to code with codable children: Secondary | ICD-10-CM

## 2012-04-21 DIAGNOSIS — I10 Essential (primary) hypertension: Secondary | ICD-10-CM

## 2012-05-18 ENCOUNTER — Ambulatory Visit: Payer: Medicare Other | Admitting: Neurosurgery

## 2012-05-18 ENCOUNTER — Encounter: Payer: Self-pay | Admitting: Vascular Surgery

## 2012-05-18 ENCOUNTER — Other Ambulatory Visit: Payer: Medicare Other

## 2012-05-19 ENCOUNTER — Encounter (INDEPENDENT_AMBULATORY_CARE_PROVIDER_SITE_OTHER): Payer: Medicare Other | Admitting: *Deleted

## 2012-05-19 ENCOUNTER — Other Ambulatory Visit (INDEPENDENT_AMBULATORY_CARE_PROVIDER_SITE_OTHER): Payer: Medicare Other | Admitting: *Deleted

## 2012-05-19 ENCOUNTER — Ambulatory Visit (INDEPENDENT_AMBULATORY_CARE_PROVIDER_SITE_OTHER): Payer: Medicare Other | Admitting: Vascular Surgery

## 2012-05-19 ENCOUNTER — Encounter: Payer: Self-pay | Admitting: Vascular Surgery

## 2012-05-19 DIAGNOSIS — I6529 Occlusion and stenosis of unspecified carotid artery: Secondary | ICD-10-CM

## 2012-05-19 DIAGNOSIS — I739 Peripheral vascular disease, unspecified: Secondary | ICD-10-CM

## 2012-05-19 DIAGNOSIS — Z48812 Encounter for surgical aftercare following surgery on the circulatory system: Secondary | ICD-10-CM | POA: Insufficient documentation

## 2012-05-19 NOTE — Progress Notes (Signed)
Patient is an 79 year old female with known peripheral arterial disease who returns for followup today. She has previously undergone right carotid endarterectomy July 2013. She underwent lower extremity arteriogram for nonhealing wound in her right foot January 31. She did not really have any reasonable revascularization options. We opted for local wound care of her right fifth toe. She denies any symptoms of TIA amaurosis or stroke. She does have some pain occasionally in the right fifth toe. She has also been having intermittent problems with constipation with her ostomy recently. She is going to see her primary care physician in the near future regarding this. She denies any fever or chills.  Review of systems: GI: Intermittent constipation and diarrhea from her ostomy as mentioned above Cardiac: Denies chest pain Respiratory: Shortness of breath with exertion  Physical exam: Filed Vitals:   05/19/12 1011  BP: 152/55  Pulse: 59  Temp: 97.9 F (36.6 C)  TempSrc: Oral  Resp: 16  Height: 5\' 2"  (1.575 m)  Weight: 137 lb (62.143 kg)  SpO2: 97%   Extremities: Right fifth toe 2 cm ulceration which appears to penetrate almost to the bone no evidence of healing, 2+ femoral pulses  Neck: No carotid bruits  Chest: Clear to auscultation bilaterally  Cardiac: Regular rate and rhythm  Abdomen: Soft nontender nondistended  Data: Patient had a carotid duplex exam as well as ABIs and toe pressures today. I reviewed and interpreted the studies. Patient's right carotid endarterectomy is widely patent. She has less than 40% left internal carotid artery stenosis. In the lower extremities ABI on the right was 0.36 with a toe pressure of 40 to left was 0.72 toe pressure of 58  Assessment: #1 no recurrent carotid stenosis #2 severe peripheral arterial disease right lower extremity with worsening of ulcer right fifth toe #3 intermittent constipation diarrhea from ostomy  Plan: #1 repeat carotid duplex exam  in one year #2 repeat ABIs in one year #3 patient and her family will discuss options on whether or not they wish to have the right fifth toe amputated. I did discuss with him today the she may not be able to heal this wound but she developed worsening of the ulcer worsening pain or infection we would need to consider this. #4 she is going to followup with Dr. Sander Radon regarding her ostomy issues.  Fabienne Bruns, MD [facility] [place] Office: [phone] Pager: [phone]

## 2012-05-20 ENCOUNTER — Emergency Department (HOSPITAL_COMMUNITY): Payer: Medicare Other

## 2012-05-20 ENCOUNTER — Encounter (HOSPITAL_COMMUNITY)
Admission: EM | Disposition: A | Discharge status: Home / Self Care | Payer: Self-pay | Source: Home / Self Care | Attending: Internal Medicine

## 2012-05-20 ENCOUNTER — Other Ambulatory Visit: Payer: Self-pay

## 2012-05-20 ENCOUNTER — Encounter (HOSPITAL_COMMUNITY): Payer: Self-pay | Admitting: Emergency Medicine

## 2012-05-20 ENCOUNTER — Inpatient Hospital Stay (HOSPITAL_COMMUNITY)
Admission: EM | Admit: 2012-05-20 | Discharge: 2012-05-23 | DRG: 280 | Disposition: A | Discharge status: Home / Self Care | Payer: Medicare Other | Attending: Internal Medicine | Admitting: Internal Medicine

## 2012-05-20 DIAGNOSIS — I429 Cardiomyopathy, unspecified: Secondary | ICD-10-CM

## 2012-05-20 DIAGNOSIS — I248 Other forms of acute ischemic heart disease: Secondary | ICD-10-CM

## 2012-05-20 DIAGNOSIS — Z96649 Presence of unspecified artificial hip joint: Secondary | ICD-10-CM

## 2012-05-20 DIAGNOSIS — I10 Essential (primary) hypertension: Secondary | ICD-10-CM

## 2012-05-20 DIAGNOSIS — I428 Other cardiomyopathies: Secondary | ICD-10-CM | POA: Diagnosis present

## 2012-05-20 DIAGNOSIS — I959 Hypotension, unspecified: Secondary | ICD-10-CM

## 2012-05-20 DIAGNOSIS — I9589 Other hypotension: Secondary | ICD-10-CM | POA: Diagnosis present

## 2012-05-20 DIAGNOSIS — Z888 Allergy status to other drugs, medicaments and biological substances status: Secondary | ICD-10-CM

## 2012-05-20 DIAGNOSIS — K5720 Diverticulitis of large intestine with perforation and abscess without bleeding: Secondary | ICD-10-CM | POA: Diagnosis present

## 2012-05-20 DIAGNOSIS — L97519 Non-pressure chronic ulcer of other part of right foot with unspecified severity: Secondary | ICD-10-CM

## 2012-05-20 DIAGNOSIS — K222 Esophageal obstruction: Secondary | ICD-10-CM | POA: Diagnosis present

## 2012-05-20 DIAGNOSIS — E785 Hyperlipidemia, unspecified: Secondary | ICD-10-CM | POA: Diagnosis present

## 2012-05-20 DIAGNOSIS — I6529 Occlusion and stenosis of unspecified carotid artery: Secondary | ICD-10-CM | POA: Diagnosis present

## 2012-05-20 DIAGNOSIS — I252 Old myocardial infarction: Secondary | ICD-10-CM

## 2012-05-20 DIAGNOSIS — Z91040 Latex allergy status: Secondary | ICD-10-CM

## 2012-05-20 DIAGNOSIS — Z79899 Other long term (current) drug therapy: Secondary | ICD-10-CM

## 2012-05-20 DIAGNOSIS — Z886 Allergy status to analgesic agent status: Secondary | ICD-10-CM

## 2012-05-20 DIAGNOSIS — Z6379 Other stressful life events affecting family and household: Secondary | ICD-10-CM

## 2012-05-20 DIAGNOSIS — Z880 Allergy status to penicillin: Secondary | ICD-10-CM

## 2012-05-20 DIAGNOSIS — L97509 Non-pressure chronic ulcer of other part of unspecified foot with unspecified severity: Secondary | ICD-10-CM | POA: Diagnosis present

## 2012-05-20 DIAGNOSIS — Z91041 Radiographic dye allergy status: Secondary | ICD-10-CM

## 2012-05-20 DIAGNOSIS — E039 Hypothyroidism, unspecified: Secondary | ICD-10-CM | POA: Diagnosis present

## 2012-05-20 DIAGNOSIS — I739 Peripheral vascular disease, unspecified: Secondary | ICD-10-CM

## 2012-05-20 DIAGNOSIS — E559 Vitamin D deficiency, unspecified: Secondary | ICD-10-CM | POA: Diagnosis present

## 2012-05-20 DIAGNOSIS — Z882 Allergy status to sulfonamides status: Secondary | ICD-10-CM

## 2012-05-20 DIAGNOSIS — Z8249 Family history of ischemic heart disease and other diseases of the circulatory system: Secondary | ICD-10-CM

## 2012-05-20 DIAGNOSIS — K922 Gastrointestinal hemorrhage, unspecified: Secondary | ICD-10-CM

## 2012-05-20 DIAGNOSIS — Z881 Allergy status to other antibiotic agents status: Secondary | ICD-10-CM

## 2012-05-20 DIAGNOSIS — E119 Type 2 diabetes mellitus without complications: Secondary | ICD-10-CM | POA: Diagnosis present

## 2012-05-20 DIAGNOSIS — I2510 Atherosclerotic heart disease of native coronary artery without angina pectoris: Secondary | ICD-10-CM

## 2012-05-20 DIAGNOSIS — I509 Heart failure, unspecified: Secondary | ICD-10-CM | POA: Diagnosis present

## 2012-05-20 DIAGNOSIS — K254 Chronic or unspecified gastric ulcer with hemorrhage: Secondary | ICD-10-CM

## 2012-05-20 DIAGNOSIS — Z7982 Long term (current) use of aspirin: Secondary | ICD-10-CM

## 2012-05-20 DIAGNOSIS — I2489 Other forms of acute ischemic heart disease: Secondary | ICD-10-CM

## 2012-05-20 DIAGNOSIS — L98499 Non-pressure chronic ulcer of skin of other sites with unspecified severity: Secondary | ICD-10-CM | POA: Diagnosis present

## 2012-05-20 DIAGNOSIS — I214 Non-ST elevation (NSTEMI) myocardial infarction: Principal | ICD-10-CM

## 2012-05-20 DIAGNOSIS — I4891 Unspecified atrial fibrillation: Secondary | ICD-10-CM

## 2012-05-20 DIAGNOSIS — Y92009 Unspecified place in unspecified non-institutional (private) residence as the place of occurrence of the external cause: Secondary | ICD-10-CM

## 2012-05-20 DIAGNOSIS — R079 Chest pain, unspecified: Secondary | ICD-10-CM

## 2012-05-20 DIAGNOSIS — I70219 Atherosclerosis of native arteries of extremities with intermittent claudication, unspecified extremity: Secondary | ICD-10-CM

## 2012-05-20 DIAGNOSIS — Z833 Family history of diabetes mellitus: Secondary | ICD-10-CM

## 2012-05-20 DIAGNOSIS — D62 Acute posthemorrhagic anemia: Secondary | ICD-10-CM | POA: Diagnosis present

## 2012-05-20 DIAGNOSIS — T3995XA Adverse effect of unspecified nonopioid analgesic, antipyretic and antirheumatic, initial encounter: Secondary | ICD-10-CM | POA: Diagnosis present

## 2012-05-20 DIAGNOSIS — E876 Hypokalemia: Secondary | ICD-10-CM | POA: Diagnosis present

## 2012-05-20 DIAGNOSIS — Z933 Colostomy status: Secondary | ICD-10-CM

## 2012-05-20 DIAGNOSIS — D649 Anemia, unspecified: Secondary | ICD-10-CM

## 2012-05-20 HISTORY — DX: Personal history of other medical treatment: Z92.89

## 2012-05-20 HISTORY — PX: ESOPHAGOGASTRODUODENOSCOPY: SHX5428

## 2012-05-20 HISTORY — DX: Gastro-esophageal reflux disease without esophagitis: K21.9

## 2012-05-20 HISTORY — DX: Non-pressure chronic ulcer of other part of right foot with unspecified severity: L97.519

## 2012-05-20 LAB — CBC
HCT: 29 % — ABNORMAL LOW (ref 36.0–46.0)
Hemoglobin: 10.3 g/dL — ABNORMAL LOW (ref 12.0–15.0)
MCH: 28.9 pg (ref 26.0–34.0)
MCHC: 35.5 g/dL (ref 30.0–36.0)
MCV: 83.9 fL (ref 78.0–100.0)
Platelets: 279 10*3/uL (ref 150–400)
RBC: 3.51 MIL/uL — ABNORMAL LOW (ref 3.87–5.11)
RDW: 14.2 % (ref 11.5–15.5)
WBC: 9.6 10*3/uL (ref 4.0–10.5)
WBC: 10.2 10*3/uL (ref 4.0–10.5)

## 2012-05-20 LAB — GLUCOSE, CAPILLARY: Glucose-Capillary: 128 mg/dL — ABNORMAL HIGH (ref 70–99)

## 2012-05-20 LAB — PRO B NATRIURETIC PEPTIDE: Pro B Natriuretic peptide (BNP): 1481 pg/mL — ABNORMAL HIGH (ref 0–450)

## 2012-05-20 LAB — POCT I-STAT TROPONIN I: Troponin i, poc: 0.09 ng/mL (ref 0.00–0.08)

## 2012-05-20 LAB — BASIC METABOLIC PANEL
Calcium: 9.2 mg/dL (ref 8.4–10.5)
Creatinine, Ser: 1.01 mg/dL (ref 0.50–1.10)
GFR calc Af Amer: 59 mL/min — ABNORMAL LOW (ref >90)
Sodium: 135 mEq/L (ref 135–145)

## 2012-05-20 LAB — MRSA PCR SCREENING: MRSA by PCR: POSITIVE — AB

## 2012-05-20 LAB — TROPONIN I: Troponin I: 3.62 ng/mL (ref <0.30)

## 2012-05-20 LAB — OCCULT BLOOD, POC DEVICE: Fecal Occult Bld: POSITIVE — AB

## 2012-05-20 LAB — PREPARE RBC (CROSSMATCH)

## 2012-05-20 SURGERY — EGD (ESOPHAGOGASTRODUODENOSCOPY)
Anesthesia: Moderate Sedation

## 2012-05-20 MED ORDER — MIDAZOLAM HCL 5 MG/ML IJ SOLN
INTRAMUSCULAR | Status: AC
  Filled 2012-05-20: qty 2

## 2012-05-20 MED ORDER — FENTANYL CITRATE 0.05 MG/ML IJ SOLN
12.5000 ug | Freq: Four times a day (QID) | INTRAMUSCULAR | Status: DC | PRN
  Administered 2012-05-21 – 2012-05-22 (×4): 12.5 ug via INTRAVENOUS
  Filled 2012-05-20 (×4): qty 2

## 2012-05-20 MED ORDER — FENTANYL CITRATE 0.05 MG/ML IJ SOLN
INTRAMUSCULAR | Status: DC | PRN
  Administered 2012-05-20 (×3): 12.5 ug via INTRAVENOUS

## 2012-05-20 MED ORDER — LIDOCAINE VISCOUS 2 % MT SOLN
OROMUCOSAL | Status: AC
  Filled 2012-05-20: qty 15

## 2012-05-20 MED ORDER — ONDANSETRON HCL 4 MG/2ML IJ SOLN: 4.0000 mg | Freq: Three times a day (TID) | INTRAMUSCULAR | Status: AC | PRN

## 2012-05-20 MED ORDER — CHLORHEXIDINE GLUCONATE CLOTH 2 % EX PADS
6.0000 | MEDICATED_PAD | Freq: Every day | CUTANEOUS | Status: DC
  Administered 2012-05-21 – 2012-05-22 (×2): 6 via TOPICAL

## 2012-05-20 MED ORDER — FUROSEMIDE 10 MG/ML IJ SOLN
40.0000 mg | Freq: Once | INTRAMUSCULAR | Status: AC
  Administered 2012-05-20: 40 mg via INTRAVENOUS
  Filled 2012-05-20: qty 4

## 2012-05-20 MED ORDER — SODIUM CHLORIDE 0.9 % IV SOLN
INTRAVENOUS | Status: DC
  Administered 2012-05-20: 15:00:00 via INTRAVENOUS

## 2012-05-20 MED ORDER — FENTANYL CITRATE 0.05 MG/ML IJ SOLN
INTRAMUSCULAR | Status: AC
  Filled 2012-05-20: qty 2

## 2012-05-20 MED ORDER — MIDAZOLAM HCL 10 MG/2ML IJ SOLN
INTRAMUSCULAR | Status: DC | PRN
  Administered 2012-05-20 (×3): 1 mg via INTRAVENOUS

## 2012-05-20 MED ORDER — FENTANYL CITRATE 0.05 MG/ML IJ SOLN
50.0000 ug | Freq: Once | INTRAMUSCULAR | Status: AC
  Administered 2012-05-20: 50 ug via INTRAVENOUS
  Filled 2012-05-20: qty 2

## 2012-05-20 MED ORDER — PANTOPRAZOLE SODIUM 40 MG IV SOLR
40.0000 mg | Freq: Two times a day (BID) | INTRAVENOUS | Status: DC
  Administered 2012-05-20 – 2012-05-23 (×7): 40 mg via INTRAVENOUS
  Filled 2012-05-20 (×11): qty 40

## 2012-05-20 MED ORDER — METOPROLOL TARTRATE 25 MG PO TABS
25.0000 mg | ORAL_TABLET | Freq: Two times a day (BID) | ORAL | Status: DC
  Administered 2012-05-20: 25 mg via ORAL
  Filled 2012-05-20 (×3): qty 1

## 2012-05-20 MED ORDER — NITROGLYCERIN 0.4 MG SL SUBL: 0.4000 mg | SUBLINGUAL_TABLET | SUBLINGUAL | Status: DC | PRN

## 2012-05-20 MED ORDER — HEPARIN (PORCINE) IN NACL 100-0.45 UNIT/ML-% IJ SOLN
750.0000 [IU]/h | INTRAMUSCULAR | Status: DC
  Filled 2012-05-20: qty 250

## 2012-05-20 MED ORDER — NITROGLYCERIN 0.4 MG/HR TD PT24
0.4000 mg | MEDICATED_PATCH | Freq: Every day | TRANSDERMAL | Status: DC
  Administered 2012-05-20 – 2012-05-23 (×4): 0.4 mg via TRANSDERMAL
  Filled 2012-05-20 (×4): qty 1

## 2012-05-20 MED ORDER — METOPROLOL TARTRATE 1 MG/ML IV SOLN: 5.0000 mg | Freq: Four times a day (QID) | INTRAVENOUS | Status: DC

## 2012-05-20 MED ORDER — DIPHENHYDRAMINE HCL 50 MG/ML IJ SOLN
INTRAMUSCULAR | Status: AC
  Filled 2012-05-20: qty 1

## 2012-05-20 MED ORDER — HEPARIN BOLUS VIA INFUSION: 3500.0000 [IU] | Freq: Once | INTRAVENOUS | Status: DC

## 2012-05-20 MED ORDER — LIDOCAINE VISCOUS 2 % MT SOLN
OROMUCOSAL | Status: DC | PRN
  Administered 2012-05-20: 10 mL via OROMUCOSAL

## 2012-05-20 MED ORDER — MUPIROCIN 2 % EX OINT
1.0000 "application " | TOPICAL_OINTMENT | Freq: Two times a day (BID) | CUTANEOUS | Status: DC
  Administered 2012-05-20 – 2012-05-23 (×6): 1 via NASAL
  Filled 2012-05-20 (×2): qty 22

## 2012-05-20 MED ORDER — FUROSEMIDE 10 MG/ML IJ SOLN: 40.0000 mg | Freq: Once | INTRAMUSCULAR | Status: DC

## 2012-05-20 MED ORDER — SODIUM CHLORIDE 0.9 % IJ SOLN
3.0000 mL | Freq: Two times a day (BID) | INTRAMUSCULAR | Status: DC
  Administered 2012-05-20 – 2012-05-21 (×2): 3 mL via INTRAVENOUS
  Administered 2012-05-21: 11:00:00 via INTRAVENOUS
  Administered 2012-05-22 (×2): 3 mL via INTRAVENOUS

## 2012-05-20 NOTE — ED Notes (Addendum)
This RN received a call from Jennye Moccasin, stating that the pt has a esophagogastrodueodenoscopy scheduled for 1500 this afternoon. She also left a pager number: [phone].

## 2012-05-20 NOTE — Consult Note (Signed)
CARDIOLOGY CONSULT NOTE  Patient ID: Julia Greene, MRN: [id number], DOB/AGE: August 09, 1930 79 y.o. 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 Her history of CAD is notable for NSTEMI in [place] in the setting of afib & perforated diverticulum with peritoneal abscess and subsequent colostomy 10/2010 - during that admission she apparently coded, underwent CPR, intubation, and her husband sadly died while she was in the hospital. She established care with Dr. Antoine Poche in 2013 and cardiac cath was performed 03/2011 showing severe 3V disease with no high grade lesions amenable to PCI and surgical revascularization would be very incomplete, thus med rx was recommended. She was recently in the hospital in Feb 2014 with chest pain and ruled in with troponin of 2.48; med rx was recommended and Plavix/Ranexa were added. She did well for several weeks but just over 2 weeks ago had an episode of her colostomy bag suddenly gushing stool. She had abdominal fullness and pain, then several days of low bag output and generalized weakness. On Easter Sunday she had fatigue, nausea, and dark vomitus. She has been taking 2 Aleve up to BID. The following day, she had multiple episodes of colostomy gushing again with abdominal pain. Since that time, she's only had about "1/2 teacup" of dark stooling into her bag. She did not seek care because her  abdominal pain improved until last night. She had a restless night due to abdominal pressure and chest pressure. The chest pressure is similar to her prior angina and radiates to her arms with bilateral hand tingling. No SOB, orthopnea, PND, weight changes, fevers, or chills. She received IV fentanyl in the ED with some relief (is allergic to many narcotics). She notes that she has been seeing "the 12-hour pink pills" show up in her bag, which I believe may be her Ranexa. This was previously an issue with her Imdur in Feb and she had to be changed to NTG patch. In the ER, she was found to have Hgb of 8.1 (was 11 two months ago), +FOBT, pBNP 1481, POC troponin 0.09. EKG shows NSR but there is mild diffuse ST depression present. She is hemodynamically stable but still reports some ongoing chest and abd discomfort. The ED has already ordered blood transfusion.  Past Medical History  Diagnosis Date  . Coronary artery disease     a. h/o NSTEMI 2012 in setting of AFib/perf'd divertic (in [place]. - coded, CPR, intubation during hospital stay);  b.  MV 2/13: EF 57%, mod isch septum and poss small part of the Ant wall;  c.  LHC 04/06/11: diffuse 3v CAD no high grade lesions amenable to PCI, surgery would be incomplete thus med rx recommended. d. NSTEMI 02/2012 managed medically.  . Atrial fibrillation     in setting of sepsis in [place]; tx with amiodarone (amio stopped 04/20/11);    Marland Kitchen Injury to liver   . Allergic rhinitis due to pollen   . Hyperlipidemia   .  Hypertension   . Vitamin D deficiency   . Anemia, unspecified   . Arthritis   . Diabetes mellitus     borderline  . Diverticulitis of colon with perforation s/p colectomy/ostomy 13Oct2012 02/23/2011    s/p colostomy  . Carotid stenosis     a.  02/2011 - RICA 80-99% and LICA 60-79%;  b. s/p R CEA Dr. Darrick Penna 09/2011  . Complication of anesthesia     Allergie to narcotics and abx.  Marland Kitchen Hx of echocardiogram     a.  Echo 10/2010 (in setting of NSTEMI at [facility] in [facility]):  mid ot apical septal AK, EF 40-45%, MAC, mild to mod MR, mild TR, no pulmo HTN;  b. Echo 02/26/11: Septal and apical hypokinesis, moderate LVE, EF 45-50%, posterior MAC with bileaflet mitral valve prolapse, posteriorly directed eccentric MR with moderate MR, mild BAE.  Marland Kitchen PAD (peripheral artery disease)     a. ABIs 9/13: R 0.55; L 0.93;  b. arterial insufficiency ulcers on right foot;  followed by [facility];  c. angiogram 01/2012 with severe bilat SFA and pop disease => not good bypass candidate and PTA not an option => prob continue conservative mgmt  . Hypothyroid   . Myocardial infarction Feb. 1, 2014  . Fall at home Feb. 6, 2014    Left leg       Most Recent Cardiac Studies: 2D Echo 02/2012 Study Conclusions - Left ventricle: The cavity size was normal. There was mild concentric hypertrophy. Systolic function was mildly reduced. The estimated ejection fraction was in the range of 45% to 50%. Severe hypokinesis and thinningof the mid-distal anteroseptal, basilar inferior and basilar inferolateral myocardium; consistent with infarction. - Aortic valve: Mildly to moderately calcified annulus. Trileaflet; mildly thickened, mildly calcified leaflets. Cusp separation was mildly reduced. - Mitral valve: Calcified annulus. Moderately thickened, moderately calcified leaflets . Mild to moderate regurgitation. - Left atrium: The atrium was mildly dilated. - Atrial septum: No defect or patent foramen ovale was Identified.  Cardiac Cath 03/2011 Indication: CAD, Abnormal Stress Test  Procedural details: The right groin was prepped, draped, and anesthetized with 1% lidocaine. Using modified Seldinger technique, a 5 French sheath was introduced into the right femoral artery. Standard Judkins catheters were used for coronary angiography and left ventriculography. Catheter exchanges were performed over a guidewire. There were no immediate procedural complications. The patient was transferred to  the post catheterization recovery area for further monitoring.  Procedural Findings:  Hemodynamics:  AO 145/86  LV 150/23  Coronary angiography:  Coronary dominance: Right  Left mainstem: Heavy calcification with distal 40% stenosis  Left anterior descending (LAD): Heavy proximal calcification with ostial 70% stenosis followed by long 50% stenosis followed by occlusion after a moderate size first diagonal. The diagonal has ostial 95% stenosis with proximal and mid diffuse 60% stenosis.  Left circumflex (LCx): The circumflex appears to be a large vessel. It is occluded in the AV groove after the first obtuse marginal. The first obtuse marginal is a small branching vessel. There is ostial 90% stenosis. The superior branch is patent but small. The inferior branch appears to be large but is occluded. There is an extensive network of occluded marginals and posterior laterals after this that fill to some degree by collaterals.  Ramus intermedius (RI): This is a large vessel. There is moderate proximal calcification. There is a long 60% stenosis in the proximal and mid segment. The remainder of the vessel is free of high-grade disease.  Right coronary artery (  RCA): Long proximal 40-50% stenosis followed by occlusion after a moderate-sized acute marginal branch. The acute marginal has long 80% ostial stenosis.  Left ventriculography: Left ventricular systolic function is normal, LVEF is estimated at 55-65%, there is moderate mitral regurgitation  Final Conclusions: Severe diffuse three-vessel coronary artery disease. Well preserved ejection fraction.  Recommendations: The patient's coronary disease is long-standing severe diffuse and extensively collateralized. At this point there are no high grade lesions amenable to percutaneous revascularization. Any attempts at surgical revascularization would be very incomplete. Given this medical management and continued attempts at risk reduction is the preferred  therapy. Given her extensive disease she would be at moderately high risk to hight risk for cardiovascular events with elective bowel surgery.    Surgical History:  Past Surgical History  Procedure Laterality Date  . Appendectomy    . Total hip arthroplasty  07/2004    Right  . Colostomy  10/2010  . Cesarean section  1955, 1959, 1960  . Abdominal hysterectomy  1976  . Bladder repair  1976  . Abdominal adhesion surgery      at time of hysterectomy  . Joint replacement  2006    Right  Hip  . Endarterectomy  10/20/2011    Procedure: ENDARTERECTOMY CAROTID;  Surgeon: Sherren Kerns, MD;  Location: [facility] OR;  Service: Vascular;  Laterality: Right;  Right Carotid Endarterectomy with patch angioplasty  . Carotid endarterectomy       Home Meds: Prior to Admission medications   Medication Sig Start Date End Date Taking? Authorizing Provider  acetaminophen-codeine (TYLENOL #3) 300-30 MG per tablet Take 1-2 tablets by mouth every 4 (four) hours as needed. For pain 10/21/11  Yes Regina J Roczniak, PA-C  amLODipine (NORVASC) 5 MG tablet Take 1 tablet (5 mg total) by mouth 2 (two) times daily. 02/24/12  Yes Beatrice Lecher, PA-C  aspirin EC 81 MG tablet Take 81 mg by mouth daily.  04/20/11  Yes Scott T Alben Spittle, PA-C  Calcium Carbonate-Vitamin D (CALCIUM 600 + D PO) Take 1 tablet by mouth 2 (two) times daily.   Yes Historical Provider, MD  clopidogrel (PLAVIX) 75 MG tablet Take 1 tablet (75 mg total) by mouth daily with breakfast. 03/01/12  Yes Ok Anis, NP  DiphenhydrAMINE HCl (BENADRYL PO) Take 30 mLs by mouth as needed. For cough   Yes Historical Provider, MD  ezetimibe (ZETIA) 10 MG tablet Take 1 tablet (10 mg total) by mouth daily. 04/06/12  Yes Rollene Rotunda, MD  levothyroxine (SYNTHROID, LEVOTHROID) 25 MCG tablet Take 25 mcg by mouth daily.   Yes Historical Provider, MD  Melatonin 5 MG CAPS Take 5 mg by mouth at bedtime as needed. To help sleep   Yes Historical Provider, MD  metFORMIN  (GLUCOPHAGE) 500 MG tablet Take 500 mg by mouth daily.   Yes Historical Provider, MD  Methylsulfonylmethane (MSM) 1000 MG TABS Take 1,000 mg by mouth daily.    Yes Historical Provider, MD  metoprolol tartrate (LOPRESSOR) 50 MG tablet Take 1 tablet (50 mg total) by mouth 2 (two) times daily. 03/01/12  Yes Ok Anis, NP  Multiple Vitamins-Minerals (ADULT GUMMY PO) Take 2 tablets by mouth daily.   Yes Historical Provider, MD  nitroGLYCERIN (NITRODUR - DOSED IN MG/24 HR) 0.4 mg/hr Place 1 patch (0.4 mg total) onto the skin daily. 03/01/12  Yes Ok Anis, NP  nitroGLYCERIN (NITROSTAT) 0.4 MG SL tablet Place 1 tablet (0.4 mg total) under the tongue every 5 (five) minutes x  3 doses as needed. For chest pain 10/02/11  Yes Ok Anis, NP  ranolazine (RANEXA) 500 MG 12 hr tablet Take 1 tablet (500 mg total) by mouth 2 (two) times daily. 03/01/12  Yes Ok Anis, NP    Inpatient Medications:    Allergies:  Allergies  Allergen Reactions  . Demerol Other (See Comments)    coma  . Codeine Swelling and Rash    Injectable. Can take by mouth.  . Benzoin     coma  . Cephalexin     Turns red  . Cephalosporins     coma  . Contrast Media (Iodinated Diagnostic Agents)     Passed out  . Dilaudid (Hydromorphone Hcl)     coma  . Insulins Swelling  . Lisinopril     cough  . Morphine And Related     coma  . Other     Patient highly sensitive to antibiotics, narcotics and any medication used to put patient to sleep for surgery.  . Penicillins     coma  . Sulfa Antibiotics     coma  . Valium (Diazepam)     cx pt to "climb walls"  . Vistaril (Hydroxyzine Hcl)   . Aspirin     coma  . Latex Rash    History   Social History  . Marital Status: Widowed    Spouse Name: N/A    Number of Children: N/A  . Years of Education: N/A   Occupational History  . Not on file.   Social History Main Topics  . Smoking status: Never Smoker   . Smokeless tobacco: Never Used  .  Alcohol Use: No  . Drug Use: No  . Sexually Active: Not on file   Other Topics Concern  . Not on file   Social History Narrative  . No narrative on file     Family History  Problem Relation Age of Onset  . Heart disease Mother 81    Pacemaker, No CAD Heart Disease before age 37  . Diverticulitis Mother   . Diabetes Mother   . Hyperlipidemia Mother   . Hypertension Mother   . Lung disease Father 2    Smoking  . Ovarian cancer Daughter   . Cancer Daughter   . Colon cancer Neg Hx   . Heart disease Maternal Grandmother   . Diabetes Maternal Aunt     x 2  . Diabetes Sister   . Heart disease Sister     Heart Disease before age 23  . Hyperlipidemia Sister   . Hypertension Sister      Review of Systems: General: negative for chills, fever, night sweats or weight changes.  Cardiovascular: see above Dermatological: negative for rash Respiratory: negative for cough or wheezing Urologic: negative for hematuria Abdominal: negative for Hematemesis. See above re pertinent positives Neurologic: negative for visual changes, syncope, or dizziness but + weakness All other systems reviewed and are otherwise negative except as noted above.  Labs: poc troponin 0.09 Lab Results  Component Value Date   WBC 9.6 05/20/2012   HGB 8.1* 05/20/2012   HCT 23.5* 05/20/2012   MCV 83.9 05/20/2012   PLT 279 05/20/2012    Recent Labs Lab 05/20/12 0503  NA 135  K 3.9  CL 101  CO2 22  BUN 25*  CREATININE 1.01  CALCIUM 9.2  GLUCOSE 172*   Radiology/Studies:  Dg Chest Port 1 View 05/20/2012  *RADIOLOGY REPORT*  Clinical Data: Chest pain  PORTABLE CHEST -  1 VIEW  Comparison: 02/27/2012  Findings: Heart size upper normal. Mild central vascular congestion.  Aortic tortuosity atherosclerosis.  Interstitial coarsening/senescent changes.  Mild left lung base opacity.  No pleural effusion or pneumothorax.  Osteopenia.  Rightward curvature of the spine.  IMPRESSION: Heart size upper normal with central  vascular congestion.  No overt edema.  Mild left lung base opacity, favor atelectasis.   Original Report Authenticated By: Jearld Lesch, M.D.     EKG: NSR 70bpm with ST depression noted inferiorly as well as V2-V3 (max 1mm in V2) Morphology of ECG appears similar to 03/2012 but ST depressions are new.  Physical Exam: Blood pressure 129/93, pulse 71, temperature 98.6 F (37 C), temperature source Oral, resp. rate 16, SpO2 98.00%. General: Well developed, well nourished thin WF in no acute distress. Head: Normocephalic, atraumatic, sclera non-icteric, no xanthomas, nares are without discharge.  Neck: Soft bilateral carotid bruits. JVD not elevated. Lungs: Clear bilaterally to auscultation without wheezes, rales, or rhonchi. Breathing is unlabored. Heart: RRR with S1 S2. No murmurs, rubs, or gallops appreciated. Abdomen: Soft, non-distended with normoactive bowel sounds. Colostomy in place with dark maroon-brown liquid stooling.  MIld diffuse tenderness. No hepatomegaly. Msk:  Strength and tone appear normal for age. Extremities: No clubbing or cyanosis. No edema.  Distal pedal pulses are diminished  Bilaterally. There is a small ulcer on the 5th digit of her R foot. Neuro: Alert and oriented X 3. No facial asymmetry. No focal deficit. Moves all extremities spontaneously. Psych:  Responds to questions appropriately with a normal affect.   Assessment and Plan:   1. GI bleed with associated ABL anemia, with history of diverticulitis s/p colostomy 2. NSTEMI in the setting of known severe CAD 3. PAD 4. HTN, stable 5. LV dysfunction with EF 45-50%, currently euvolemic  Troponin elevation and EKG changes are not surprising given her known severe CAD that has been treated medically. Cath 2013 showed diffuse 3V CAD, but no high grade lesions amenable to PCI and surgical revascularization would be very incomplete. She had an NSTEMI in 02/2012 that was also treated medically. At this point with  ongoing GI bleed, she would not be a candidate for cath at present due to the fact we would not be able to use antiplatelet therapy. This could be reconsidered at some point if she stabilizes from bleeding issues. Would avoid heparin. IM/GI to admit & work up gastroenterology issues. Consider NSAID-induced upper GI issue. With her PAD, there is also the possibility of mesenteric ischemia. Would transfuse to keep Hgb>10. Recommend to continue BB (if she is going to be NPO, please change to 5mg  metoprolol scheduled IV q6hr), continue CCB, and NTG patch as pressure allows. She seems to get relief with fentanyl so this can be scheduled as well. She does not seem to be absorbing the Ranexa per her report. See below for additional thoughts.  Code status: we discussed code status in depth today. 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 Her son was included in this discussion and the patient feels confident that he understands her wishes.  Signed, Ronie Spies PA-C 05/20/2012, 8:34 AM   Patient seen and examined  Agree with findings noted above by D Dunn  I have amended physical exam Patient presents today with a two week history of increased colostomy output (melena), vomiting (dark).  Does note the  passage of whole pills into colostomy bag. Patient admits to [facility] use.  This AM developed chest pressure similar to prior angina. Currently this has improved but has not subsided.  She appears comfortable.  Labs signif for trop 0.09. Hgb 8.   EKG with diffuse ST depression    Recomm to continue medical therapy.  May be demand ischemia in setting of GI bleed/anemia.   Other therapy limited given bleeding  WOuld continue NTG patch.  Switch lopressor to IV.  Continue other meds po (once taking po) and watch for appearance in bag.   Agree with transfusion.  GI consult.    Dietrich Pates

## 2012-05-20 NOTE — ED Notes (Signed)
Pain currently 7.06/02/08 chest bilateral upper extremities achy.

## 2012-05-20 NOTE — Progress Notes (Signed)
Paged NP regarding trop results of 3.62

## 2012-05-20 NOTE — ED Notes (Signed)
Phlebotomy states unable to draw blood because patient receiving blood product

## 2012-05-20 NOTE — ED Notes (Signed)
MD Bonk states to hold Heparin to further evaluation.

## 2012-05-20 NOTE — ED Notes (Signed)
MD at bedside. 

## 2012-05-20 NOTE — ED Notes (Signed)
Pt complaining of chest pain since yesterday around 5 pm. Pt states she feels like it pressure under her left breast. Pt took 324 asa at home. EMS states they gave patient 3 nitros in which her pain decreased. Pt denies SOB, N/V. Pt has had 5 heart attacks in the past. First MI at 79 years old. Last 2 MI about 15 months ago. Pt states she also vomited x3 on Sunday and having problems with constipation with her colostomy bag.

## 2012-05-20 NOTE — Progress Notes (Signed)
Returned from endoscopy

## 2012-05-20 NOTE — Progress Notes (Signed)
Paged Dr. Gonzella Lex regarding trop 3.62.

## 2012-05-20 NOTE — Progress Notes (Signed)
Endoscopy demonstrates multiple gastric ulcers. See report for recommendations.

## 2012-05-20 NOTE — H&P (Addendum)
[facility] History and Physical  HADLIE GIPSON ZOX:[id number] DOB: 1931/01/19 DOA: 05/20/2012  Referring physician:Dr Bonk PCP: Oneal Grout, MD   Chief Complaint:  Chest pain x 1 day Bleeding from ostomy x 1-2 weeks  HPI:  79 y/o female with severe three-vessel disease not amenable to PCI or CABG, mild CHF with EF of 45%, diabetes mellitus, severe peripheral artery disease with a right fifth toe ulcer, history of diverticulitis with perforation and abscess status post colostomy, carotid stenosis status post carotid endarterectomy in 2013, hospitalization for NSTEMI ( last admitted 2 months back with plan on medical  management) presented with acute onset chest pain since last night` which was 10/10 in intensity and radiating to her arms. Her son called EMS and the patient was brought to the ED. Patient also gives history of bright red blood gushing from her colostomy 10 days back followed by another episode 4 days back also associated with 3 episodes of hematemesis. She also informs having poor output from the colostomy seems to weeks. She informs taking 2-3 tablets of Aleve for pain. She saw her vascular surgeon and Dr. Darrick Penna yesterday in the clinic regarding her severe lower extremity peripheral arterial disease with a nonhealing ulcer. Dr. Darrick Penna discussed options of toe amputations with her.  Course in the ED Since vitals were stable. EKG done showed diffuse ST depression in anterolateral leads. CBC showed drop in hemoglobin close to 3 g from her recent labs. Patient was given aspirin and sublingual nitroglycerin in the ED. Anticoagulation was as given concern for acute anemia. Stool for occult blood from the colostomy was positive. Patient had a subsequent positive troponin of 0.19. Given findings of NSTEMI and acute blood loss anemia prior hospice called for admission to step down monitoring. [facility] cardiology consulted as well. On my evaluation patient still had the chest pain  of 6/10.   Review of Systems:  Constitutional: Denies fever, chills, diaphoresis,poor appetite and fatigue.  HEENT: Denies photophobia, eye pain, redness, hearing loss, ear pain, congestion, sore throat, rhinorrhea, sneezing, mouth sores, trouble swallowing, neck pain, neck stiffness and tinnitus.   Respiratory: Denies SOB, DOE, cough, chest tightness,  and wheezing.   Cardiovascular: substernal chest pain, Denies chest pain, palpitations and leg swelling.  Gastrointestinal: Hematemesis and blood in colostomy with poor colostomy output.Denies nausea, vomiting, abdominal pain, ,  and abdominal distention.  Genitourinary: Denies dysuria, urgency, frequency, hematuria, flank pain and difficulty urinating.  Musculoskeletal: Denies myalgias, back pain, joint swelling, arthralgias and gait problem.  Skin: Denies pallor, rash and wound.  Neurological: Denies dizziness, seizures, syncope, weakness, light-headedness, numbness and headaches.  Hematological: Denies adenopathy. Easy bruising, personal or family bleeding history  Psychiatric/Behavioral: Denies suicidal ideation, mood changes, confusion, nervousness, sleep disturbance and agitation   Past Medical History  Diagnosis Date  . Coronary artery disease     a. h/o NSTEMI 2012 in setting of AFib/perf'd divertic (in [place]. - coded, CPR, intubation during hospital stay);  b.  MV 2/13: EF 57%, mod isch septum and poss small part of the Ant wall;  c.  LHC 04/06/11: diffuse 3v CAD no high grade lesions amenable to PCI, surgery would be incomplete thus med rx recommended. d. NSTEMI 02/2012 managed medically.  . Atrial fibrillation     in setting of sepsis in [place]; tx with amiodarone (amio stopped 04/20/11);    Marland Kitchen Injury to liver   . Allergic rhinitis due to pollen   . Hyperlipidemia   . Hypertension   . Vitamin D  deficiency   . Anemia, unspecified   . Arthritis   . Diabetes mellitus     borderline  . Diverticulitis of colon with perforation s/p  colectomy/ostomy 13Oct2012 02/23/2011    s/p colostomy  . Carotid stenosis     a.  02/2011 - RICA 80-99% and LICA 60-79%;  b. s/p R CEA Dr. Darrick Penna 09/2011  . Complication of anesthesia     Allergie to narcotics and abx.  . LV dysfunction     a.  Echo 10/2010 (in setting of NSTEMI at [facility] in [facility]):  mid ot apical septal AK, EF 40-45%, MAC, mild to mod MR, mild TR, no pulmo HTN;  b. Echo 02/2012: EF 45-50% with WMA, mild-mod MR.  Marland Kitchen PAD (peripheral artery disease)     a. ABIs 9/13: R 0.55; L 0.93;  b. arterial insufficiency ulcers on right foot;  followed by [facility];  c. angiogram 01/2012 with severe bilat SFA and pop disease => not good bypass candidate and PTA not an option => prob continue conservative mgmt  . Hypothyroid   . Myocardial infarction Feb. 1, 2014  . Fall at home Feb. 6, 2014    Left leg    Past Surgical History  Procedure Laterality Date  . Appendectomy    . Total hip arthroplasty  07/2004    Right  . Colostomy  10/2010  . Cesarean section  1955, 1959, 1960  . Abdominal hysterectomy  1976  . Bladder repair  1976  . Abdominal adhesion surgery      at time of hysterectomy  . Joint replacement  2006    Right  Hip  . Endarterectomy  10/20/2011    Procedure: ENDARTERECTOMY CAROTID;  Surgeon: Sherren Kerns, MD;  Location: [facility] OR;  Service: Vascular;  Laterality: Right;  Right Carotid Endarterectomy with patch angioplasty  . Carotid endarterectomy     Social History:  reports that she has never smoked. She has never used smokeless tobacco. She reports that she does not drink alcohol or use illicit drugs.  Allergies  Allergen Reactions  . Demerol Other (See Comments)    coma  . Codeine Swelling and Rash    Injectable. Can take by mouth.  . Benzoin     coma  . Cephalexin     Turns red  . Cephalosporins     coma  . Contrast Media (Iodinated Diagnostic Agents)     Passed out  . Dilaudid (Hydromorphone Hcl)     coma  . Insulins Swelling  . Lisinopril     cough   . Morphine And Related     coma  . Other     Patient highly sensitive to antibiotics, narcotics and any medication used to put patient to sleep for surgery.  . Penicillins     coma  . Sulfa Antibiotics     coma  . Valium (Diazepam)     cx pt to "climb walls"  . Vistaril (Hydroxyzine Hcl)   . Aspirin     coma  . Latex Rash    Family History  Problem Relation Age of Onset  . Heart disease Mother 60    Pacemaker, No CAD Heart Disease before age 60  . Diverticulitis Mother   . Diabetes Mother   . Hyperlipidemia Mother   . Hypertension Mother   . Lung disease Father 6    Smoking  . Ovarian cancer Daughter   . Cancer Daughter   . Colon cancer Neg Hx   .  Heart disease Maternal Grandmother   . Diabetes Maternal Aunt     x 2  . Diabetes Sister   . Heart disease Sister     Heart Disease before age 77  . Hyperlipidemia Sister   . Hypertension Sister     Prior to Admission medications   Medication Sig Start Date End Date Taking? Authorizing Provider  acetaminophen-codeine (TYLENOL #3) 300-30 MG per tablet Take 1-2 tablets by mouth every 4 (four) hours as needed. For pain 10/21/11  Yes Regina J Roczniak, PA-C  amLODipine (NORVASC) 5 MG tablet Take 1 tablet (5 mg total) by mouth 2 (two) times daily. 02/24/12  Yes Beatrice Lecher, PA-C  aspirin EC 81 MG tablet Take 81 mg by mouth daily.  04/20/11  Yes Scott T Alben Spittle, PA-C  Calcium Carbonate-Vitamin D (CALCIUM 600 + D PO) Take 1 tablet by mouth 2 (two) times daily.   Yes Historical Provider, MD  clopidogrel (PLAVIX) 75 MG tablet Take 1 tablet (75 mg total) by mouth daily with breakfast. 03/01/12  Yes Ok Anis, NP  DiphenhydrAMINE HCl (BENADRYL PO) Take 30 mLs by mouth as needed. For cough   Yes Historical Provider, MD  ezetimibe (ZETIA) 10 MG tablet Take 1 tablet (10 mg total) by mouth daily. 04/06/12  Yes Rollene Rotunda, MD  levothyroxine (SYNTHROID, LEVOTHROID) 25 MCG tablet Take 25 mcg by mouth daily.   Yes Historical  Provider, MD  Melatonin 5 MG CAPS Take 5 mg by mouth at bedtime as needed. To help sleep   Yes Historical Provider, MD  metFORMIN (GLUCOPHAGE) 500 MG tablet Take 500 mg by mouth daily.   Yes Historical Provider, MD  Methylsulfonylmethane (MSM) 1000 MG TABS Take 1,000 mg by mouth daily.    Yes Historical Provider, MD  metoprolol tartrate (LOPRESSOR) 50 MG tablet Take 1 tablet (50 mg total) by mouth 2 (two) times daily. 03/01/12  Yes Ok Anis, NP  Multiple Vitamins-Minerals (ADULT GUMMY PO) Take 2 tablets by mouth daily.   Yes Historical Provider, MD  nitroGLYCERIN (NITRODUR - DOSED IN MG/24 HR) 0.4 mg/hr Place 1 patch (0.4 mg total) onto the skin daily. 03/01/12  Yes Ok Anis, NP  nitroGLYCERIN (NITROSTAT) 0.4 MG SL tablet Place 1 tablet (0.4 mg total) under the tongue every 5 (five) minutes x 3 doses as needed. For chest pain 10/02/11  Yes Ok Anis, NP  ranolazine (RANEXA) 500 MG 12 hr tablet Take 1 tablet (500 mg total) by mouth 2 (two) times daily. 03/01/12  Yes Ok Anis, NP    Physical Exam:  Filed Vitals:   05/20/12 0424 05/20/12 0500 05/20/12 0547  BP: 144/47 140/63 129/93  Pulse: 70 67 71  Temp: 98.6 F (37 C)    TempSrc: Oral    Resp: 23 16 16   SpO2: 100% 98% 98%    Constitutional: Vital signs reviewed.  Patient is a well-developed and well-nourished in no acute distress and cooperative with exam. Alert and oriented x3.  Head: Normocephalic and atraumatic Ear: TM normal bilaterally Mouth: no erythema or exudates, MMM Eyes: PERRL, EOMI, conjunctivae normal, No scleral icterus.  Neck: Supple, Trachea midline normal ROM, No JVD, mass, thyromegaly, or carotid bruit present.  Cardiovascular: RRR, S1 normal, S2 normal, no MRG, pulses symmetric and intact bilaterally Pulmonary/Chest: CTAB, no wheezes, rales, or rhonchi Abdominal: Soft. Non-tender, non-distended, bowel sounds are normal, no masses, organomegaly, or guarding present.  GU: no CVA  tenderness Musculoskeletal: No joint deformities, erythema, or stiffness, ROM  full and no nontender Ext: no edema and no cyanosis, absent dorsalis pedes pulses b/l on exam, painful small 5 mm ulcer over right 5th toe.  Hematology: no cervical, inginal, or axillary adenopathy.  Neurological: A&O x3, Strenght is normal and symmetric bilaterally, cranial nerve II-XII are grossly intact, no focal motor deficit, sensory intact to light touch bilaterally.  Skin: Warm, dry and intact. No rash, cyanosis, or clubbing.  Psychiatric: Normal mood and affect. speech and behavior is normal. Judgment and thought content normal. Cognition and memory are normal.   Labs on Admission:  Basic Metabolic Panel:  Recent Labs Lab 05/20/12 0503  NA 135  K 3.9  CL 101  CO2 22  GLUCOSE 172*  BUN 25*  CREATININE 1.01  CALCIUM 9.2   Liver Function Tests: No results found for this basename: AST, ALT, ALKPHOS, BILITOT, PROT, ALBUMIN,  in the last 168 hours No results found for this basename: LIPASE, AMYLASE,  in the last 168 hours No results found for this basename: AMMONIA,  in the last 168 hours CBC:  Recent Labs Lab 05/20/12 0503  WBC 9.6  HGB 8.1*  HCT 23.5*  MCV 83.9  PLT 279   Cardiac Enzymes: No results found for this basename: CKTOTAL, CKMB, CKMBINDEX, TROPONINI,  in the last 168 hours BNP: No components found with this basename: POCBNP,  CBG: No results found for this basename: GLUCAP,  in the last 168 hours  Radiological Exams on Admission: Dg Chest Port 1 View  05/20/2012  *RADIOLOGY REPORT*  Clinical Data: Chest pain  PORTABLE CHEST - 1 VIEW  Comparison: 02/27/2012  Findings: Heart size upper normal. Mild central vascular congestion.  Aortic tortuosity atherosclerosis.  Interstitial coarsening/senescent changes.  Mild left lung base opacity.  No pleural effusion or pneumothorax.  Osteopenia.  Rightward curvature of the spine.  IMPRESSION: Heart size upper normal with central vascular  congestion.  No overt edema.  Mild left lung base opacity, favor atelectasis.   Original Report Authenticated By: Jearld Lesch, M.D.     EKG: NSR with ST depression in anteriolateral  leads  Assessment/Plan  Principal Problem:   NSTEMI (non-ST elevated myocardial infarction) Admit to step down  serial CE and EKG.s till has 6/10 chest pain. Hold ASA , plavix and ranexa given acute GI bleed. Hold anticoagulation for the same.  keep NPO. Home po meds. IV metoprolol q 6 hour. Continue nitropaste for chest pain. Prn fentanyl for severe pain. Patient is allergic to morphine.  Appreciate cardiology consult. Patient has severe CAD with 3 vessel disease on cath not a candidate for surgery and not a high grade lesion  For PCI. Patient admitted 2 months back for NSTEMI and managed medically.    Active Problems:   GI bleed Patient noted for almost 3 grams drop in H&H from recent labs. Informs taking 2-3 tablets of aleve almost daily for pain. Had bleeding from colostomy bag on 2 occasions and 3 episodes of hematemesis 5 days back. Continue IV PPI. transfuse 2 u PRBC to keep hb >10 given severe CAD.  She had colonoscopy via colostomy in 11/13 by lebeaur Gi showing mild diverticulosis only. GI consulted regarding acute anemia with GI bleed.  Monitor serial H&H.      HTN (hypertension) BP stable. Prn metoprolol for BP control    Diverticulitis of colon with perforation s/p colectomy/ostomy Oct2012 Has colostomy with issues of constipation for past few weeks and bleeding from the stoma on 2 occasions.    Cardiomyopathy EF  of 45 % . Currently euvolemic  Carotid artery stenosis Follows with Dr Darrick Penna. S/p Rt CEA in 2013. Patet carotid on exam yesterday at Dr Darrick Penna clinic.     Foot ulcer, right Secondary to severe PAD. Follows with Dr Darrick Penna. Saw patient yesterday dn noted for severe PAD on ABI with right 5th toe ulcer. He discussed about amputation of the 5th toe with the patient as well.  Recommended local dressing for now.   Diabetes mellitus  on metformin which will be held. Place on SSI  Hypothyroidism  on synthroid   DVT prophylaxis: SCD   Diet: NPO. pending GI evalaution  Code Status: full code Family Communication: son at bedside Disposition Plan: currently inpatient  Eddie North [facility] Pager [phone]  If 7PM-7AM, please contact night-coverage [email or web address] Password [facility] 05/20/2012, 8:41 AM    Total time spent: 70 minutes

## 2012-05-20 NOTE — Progress Notes (Signed)
Adm nurse notified to complete adm hx.

## 2012-05-20 NOTE — Op Note (Signed)
[facility] [address] [place], [address]   ENDOSCOPY PROCEDURE REPORT  PATIENT: Julia, Greene  MR#: [id number] BIRTHDATE: Aug 03, 1930 , 82  yrs. old GENDER: Female ENDOSCOPIST: Louis Meckel, MD REFERRED BY: PROCEDURE DATE:  05/20/2012 PROCEDURE:  EGD w/ biopsy ASA CLASS:     Class III INDICATIONS: MEDICATIONS: These medications were titrated to patient response per physician's verbal order, Versed 3 mg IV, and Fentanyl-Detailed 37.5 TOPICAL ANESTHETIC: Lidocaine Spray  DESCRIPTION OF PROCEDURE: After the risks benefits and alternatives of the procedure were thoroughly explained, informed consent was obtained.  The EG-[id number] ([id number]) endoscope was introduced through the mouth and advanced to the third portion of the duodenum. Without limitations.  The instrument was slowly withdrawn as the mucosa was fully examined.      In the gastric antrum there are several clean-based ulcers.  The arches ulcer measured at least 1 cm and was deep with pigment at its space.  Surrounding mucosa was erythematous.  Multiple biopsies were taken.  There is an early stricture at the GE junction.  The Z line was irregular with a island of gastric appearing mucosa extending at least 1 cm proximally.  Biopsies were taken to rule out Barrett's esophagus.   In the gastric antrum there are several clean-based ulcers.  The largest ulcer measured at least 1 cm and was deep with pigment at its space.  Surrounding mucosa was erythematous. Multiple biopsies were taken.  There is an early stricture at the GE junction.  The Z line was irregular with a island of gastric appearing mucosa extending at least 1 cm proximally.  Biopsies were taken to rule out Barrett's esophagus.   The remainder of the upper endoscopy exam was otherwise normal.  Retroflexed views revealed no abnormalities. The scope was then withdrawn from the patient and the  procedure completed.  COMPLICATIONS: There were no complications.  ENDOSCOPIC IMPRESSION: 1.  multiple gastric ulcers, single ulcer with stigmata of recent hemorrhage 2.  early esophageal stricture 3.  rule out Barrett's esophagus  RECOMMENDATIONS: 1.  Avoid NSAIDs 2.  twice a day PPI therapy for 2 weeks and then daily 3.  try to hold Plavix for 3-5 days to allow for ulcer healing but can restart as needed  REPEAT EXAM:  eSigned:  Louis Meckel, MD 05/20/2012 4:31 PM   CC:  PATIENT NAME:  Julia, Greene MR#: [id number]

## 2012-05-20 NOTE — ED Provider Notes (Addendum)
History     CSN: [id number]  Arrival date & time 05/20/12  0420   First MD Initiated Contact with Patient 05/20/12 [phone]      Chief Complaint  Patient presents with  . Chest Pain  . Constipation    HPI Julia Greene is a 79 y.o. female with an extensive history of coronary artery disease including multiple MI/NSTEMI admissions. I had the pleasure of meeting this woman back in September for similar presentation.  Patient was at a physicians appointment yesterday when the sensation of chest pressure again, was just a discomfort at the time and she didn't mention it. Patient has been wearing a nitroglycerin patch for chronic angina. Despite the nitroglycerin patch, the chest pain gradually worsened and became severe, became a pressure, it radiated to both arms, was not associated with shortness of breath nausea or diaphoresis. Patient says this feels like prior MIs/NSTEMI presentations.  Patient says she's also noticed some dark black material coming out of her ostomy bag. She is status post sigmoid colectomy for diverticulosis diverticulitis.     Past Medical History  Diagnosis Date  . Coronary artery disease     a. h/o NSTEMI 2012 in setting of AFib/perf'd divertic (in [place]. - coded, CPR, intubation during hospital stay);  b.  MV 2/13: EF 57%, mod isch septum and poss small part of the Ant wall;  c.  LHC 04/06/11: diffuse 3v CAD no high grade lesions amenable to PCI, surgery would be incomplete thus med rx recommended. d. NSTEMI 02/2012 managed medically.  . Atrial fibrillation     in setting of sepsis in [place]; tx with amiodarone (amio stopped 04/20/11);    Marland Kitchen Injury to liver   . Allergic rhinitis due to pollen   . Hyperlipidemia   . Hypertension   . Vitamin D deficiency   . Anemia, unspecified   . Arthritis   . Diabetes mellitus     borderline  . Diverticulitis of colon with perforation s/p colectomy/ostomy 13Oct2012 02/23/2011    s/p colostomy  . Carotid stenosis     a.  02/2011 - RICA  80-99% and LICA 60-79%;  b. s/p R CEA Dr. Darrick Penna 09/2011  . Complication of anesthesia     Allergie to narcotics and abx.  . LV dysfunction     a.  Echo 10/2010 (in setting of NSTEMI at [facility] in [facility]):  mid ot apical septal AK, EF 40-45%, MAC, mild to mod MR, mild TR, no pulmo HTN;  b. Echo 02/2012: EF 45-50% with WMA, mild-mod MR.  Marland Kitchen PAD (peripheral artery disease)     a. ABIs 9/13: R 0.55; L 0.93;  b. arterial insufficiency ulcers on right foot;  followed by [facility];  c. angiogram 01/2012 with severe bilat SFA and pop disease => not good bypass candidate and PTA not an option => prob continue conservative mgmt  . Hypothyroid   . Myocardial infarction Feb. 1, 2014  . Fall at home Feb. 6, 2014    Left leg     Past Surgical History  Procedure Laterality Date  . Appendectomy    . Total hip arthroplasty  07/2004    Right  . Colostomy  10/2010  . Cesarean section  1955, 1959, 1960  . Abdominal hysterectomy  1976  . Bladder repair  1976  . Abdominal adhesion surgery      at time of hysterectomy  . Joint replacement  2006    Right  Hip  . Endarterectomy  10/20/2011  Procedure: ENDARTERECTOMY CAROTID;  Surgeon: Sherren Kerns, MD;  Location: [facility] OR;  Service: Vascular;  Laterality: Right;  Right Carotid Endarterectomy with patch angioplasty  . Carotid endarterectomy      Family History  Problem Relation Age of Onset  . Heart disease Mother 63    Pacemaker, No CAD Heart Disease before age 58  . Diverticulitis Mother   . Diabetes Mother   . Hyperlipidemia Mother   . Hypertension Mother   . Lung disease Father 43    Smoking  . Ovarian cancer Daughter   . Cancer Daughter   . Colon cancer Neg Hx   . Heart disease Maternal Grandmother   . Diabetes Maternal Aunt     x 2  . Diabetes Sister   . Heart disease Sister     Heart Disease before age 82  . Hyperlipidemia Sister   . Hypertension Sister     History  Substance Use Topics  . Smoking status: Never Smoker   .  Smokeless tobacco: Never Used  . Alcohol Use: No    OB History   Grav Para Term Preterm Abortions TAB SAB Ect Mult Living                  Review of Systems At least 10pt or greater review of systems completed and are negative except where specified in the HPI.  Allergies  Demerol; Codeine; Benzoin; Cephalexin; Cephalosporins; Contrast media; Dilaudid; Insulins; Lisinopril; Morphine and related; Other; Penicillins; Sulfa antibiotics; Valium; Vistaril; Aspirin; and Latex  Home Medications   Current Outpatient Rx  Name  Route  Sig  Dispense  Refill  . acetaminophen-codeine (TYLENOL #3) 300-30 MG per tablet   Oral   Take 1-2 tablets by mouth every 4 (four) hours as needed. For pain         . amLODipine (NORVASC) 5 MG tablet   Oral   Take 1 tablet (5 mg total) by mouth 2 (two) times daily.   60 tablet   11   . aspirin EC 81 MG tablet   Oral   Take 81 mg by mouth daily.          . Calcium Carbonate-Vitamin D (CALCIUM 600 + D PO)   Oral   Take 1 tablet by mouth 2 (two) times daily.         . clopidogrel (PLAVIX) 75 MG tablet   Oral   Take 1 tablet (75 mg total) by mouth daily with breakfast.   30 tablet   6   . DiphenhydrAMINE HCl (BENADRYL PO)   Oral   Take 30 mLs by mouth as needed. For cough         . ezetimibe (ZETIA) 10 MG tablet   Oral   Take 1 tablet (10 mg total) by mouth daily.   30 tablet   11   . levothyroxine (SYNTHROID, LEVOTHROID) 25 MCG tablet   Oral   Take 25 mcg by mouth daily.         . Melatonin 5 MG CAPS   Oral   Take 5 mg by mouth at bedtime as needed. To help sleep         . metFORMIN (GLUCOPHAGE) 500 MG tablet   Oral   Take 500 mg by mouth daily.         . Methylsulfonylmethane (MSM) 1000 MG TABS   Oral   Take 1,000 mg by mouth daily.          . metoprolol  tartrate (LOPRESSOR) 50 MG tablet   Oral   Take 1 tablet (50 mg total) by mouth 2 (two) times daily.   60 tablet   6   . Multiple Vitamins-Minerals (ADULT  GUMMY PO)   Oral   Take 2 tablets by mouth daily.         . nitroGLYCERIN (NITRODUR - DOSED IN MG/24 HR) 0.4 mg/hr   Transdermal   Place 1 patch (0.4 mg total) onto the skin daily.   30 patch   6   . nitroGLYCERIN (NITROSTAT) 0.4 MG SL tablet   Sublingual   Place 1 tablet (0.4 mg total) under the tongue every 5 (five) minutes x 3 doses as needed. For chest pain   25 tablet   3   . ranolazine (RANEXA) 500 MG 12 hr tablet   Oral   Take 1 tablet (500 mg total) by mouth 2 (two) times daily.   60 tablet   6     BP 129/93  Pulse 71  Temp(Src) 98.6 F (37 C) (Oral)  Resp 16  SpO2 98%  Physical Exam  Nursing notes reviewed.  Electronic medical record reviewed. VITAL SIGNS:   Filed Vitals:   05/20/12 0424 05/20/12 0500 05/20/12 0547  BP: 144/47 140/63 129/93  Pulse: 70 67 71  Temp: 98.6 F (37 C)    TempSrc: Oral    Resp: 23 16 16   SpO2: 100% 98% 98%   CONSTITUTIONAL: Awake, oriented x4, appears non-toxic but very pale and chronically ill-appearing HENT: Atraumatic, normocephalic, oral mucosa pink and moist, airway patent. Nares patent without drainage. External ears normal. EYES: Conjunctiva clear, EOMI, PERRLA NECK: Trachea midline, non-tender, supple CARDIOVASCULAR: Normal heart rate, Normal rhythm, soft systolic murmur PULMONARY/CHEST: Clear to auscultation, no rhonchi, wheezes, or rales. Symmetrical breath sounds. Non-tender. ABDOMINAL: Non-distended, soft, non-tender - no rebound or guarding.  Ostomy bag in left lower quadrant with dark black material coming out of it. BS normal. NEUROLOGIC: Non-focal, moving all four extremities, no gross sensory or motor deficits. EXTREMITIES: No clubbing, cyanosis, or edema. Ulcer to the fifth toe -  Does not appear necrotic SKIN: Warm, Dry, No erythema, No rash  ED Course  Procedures (including critical care time)  Date: 05/20/2012  Rate: 60  Rhythm: normal sinus rhythm  QRS Axis: normal  Intervals: normal  ST/T Wave  abnormalities: ST depressions in inferior leads, V4 through V6.  Conduction Disutrbances: none  Narrative Interpretation: Ischemic changes in inferior lateral leads   Right-sided EKG and posterior leads  Date: 05/20/2012  Rate: 70  Rhythm: normal sinus rhythm  QRS Axis: normal  Intervals: normal  ST/T Wave abnormalities: Small amount of depression again seen in inferior leads as well as V5R and V6R, posterior leads are unchanged  Conduction Disutrbances: none  Narrative Interpretation: Ischemic changes in inferior and right-sided leads     Labs Reviewed  CBC - Abnormal; Notable for the following:    RBC 2.80 (*)    Hemoglobin 8.1 (*)    HCT 23.5 (*)    All other components within normal limits  BASIC METABOLIC PANEL - Abnormal; Notable for the following:    Glucose, Bld 172 (*)    BUN 25 (*)    GFR calc non Af Amer 51 (*)    GFR calc Af Amer 59 (*)    All other components within normal limits  PRO B NATRIURETIC PEPTIDE - Abnormal; Notable for the following:    Pro B Natriuretic peptide (BNP) 1481.0 (*)  All other components within normal limits  POCT I-STAT TROPONIN I - Abnormal; Notable for the following:    Troponin i, poc 0.09 (*)    All other components within normal limits  OCCULT BLOOD, POC DEVICE - Abnormal; Notable for the following:    Fecal Occult Bld POSITIVE (*)    All other components within normal limits  TYPE AND SCREEN  PREPARE RBC (CROSSMATCH)   Dg Chest Port 1 View  05/20/2012  *RADIOLOGY REPORT*  Clinical Data: Chest pain  PORTABLE CHEST - 1 VIEW  Comparison: 02/27/2012  Findings: Heart size upper normal. Mild central vascular congestion.  Aortic tortuosity atherosclerosis.  Interstitial coarsening/senescent changes.  Mild left lung base opacity.  No pleural effusion or pneumothorax.  Osteopenia.  Rightward curvature of the spine.  IMPRESSION: Heart size upper normal with central vascular congestion.  No overt edema.  Mild left lung base opacity, favor  atelectasis.   Original Report Authenticated By: Jearld Lesch, M.D.      1. Demand ischemia of myocardium   2. Anemia   3. Chest pain at rest       MDM  Patient appears pale, initial EKG showing diffuse ST depression in the inferior and lateral leads. Right sided and posterior leaves obtained which do not show any ST elevation right-sided V5R and V6R show some ST depression as well.  Hemoglobin is 8.1 which is a 3 g drop from 2 months ago. BNP is slightly elevated at 1481, fecal occult blood is positive, BUN is also elevated at 25 suggestive of GI bleed.  Patient denied any NSAID use to me or using Goody's powders etc. however did admit to the cardiology PA that she had been using Aleve frequently to help toe pain.  Discussed with Dr.Dhungel for admission.  Discussed with [facility] cardiology for consult.  Patient admitted to step down as an inpatient. Currently stable. With suggestion of GI bleed will not start heparin, have given the patient 1 unit packed red blood cells for demand ischemia.    Jones Skene, MD 05/20/12 [id number]  Jones Skene, MD 05/20/12 [phone]

## 2012-05-20 NOTE — Consult Note (Signed)
[facility] Consult: 9:20 AM 05/20/2012   Referring Provider: Dr Gonzella Lex Primary Care Physician:  Oneal Grout, MD Primary Gastroenterologist:  Dr. Christella Hartigan  Reason for Consultation:  Anemia and bleeding into ostomy  HPI: Julia Greene is a 79 y.o. female.  She is diabetic.  Has severe CAD/ASPVD/bilateral carotid stenosis with chronic chest pain and use of sublingual NTG. S/P right CEA 09/2011.  Has chronic vascular insufficiency ulcers in feet.   S/P 02/26/12 abdominal aortogram showed severe bil SFA and popliteal artery occlusive disease.  Per Dr Darrick Penna, she is not a candidate for percutaneous revascularization. Last admission in 2/1 -03/01/12 for chest pain following peripheral angiogram.  Ruled in for NSTEMI and treated medically based on previous 2013 cardiac cath showing poor targets for PCI and poor surgical risk..    Previous hx of sigmoid colectomy, colostomy, hartman's pouch 10/2010 in [place] after perforated diverticulitis.  Hospital stay involved acute MI, CPR/intubation, atrial fibrillation, sepsis. During this time her husband died.  She now lives with family in [place].  Colonoscopy via ostomy and inspection of hartman's pouch via anus in 02/2011 by Dr Christella Hartigan showed moderate diverticulosis.  She did not proceed to ostomy reversal though this was a reason for the colonoscopy.  Leading into [facility] Sunday, ostomy out put was diminished to only a few tablespoons of brown soft stool, pt felt constipated but was eating well normal volume of meals. That day she had tarryblack but watery stool noted in ostomy. The rest of the week she still had very little ostomy output of dark, watery material.  Still felt weak.  On easter Sunday she vomitted coffe-like material  In quick succesion x 3.  2 AM on Monday AM her ostomy out put kicked into large volume and burst the bag with watery, dark and strongly foul smelling material.  She ended up filling ostomy  bag several times.  Still felt tired and weak but since then only scant ostomy output.  Seen by Dr  Darrick Penna for follow up of toe ulcer.  He said he could amputate toe if pain or ulceration persists/worsens. He suggested following up with her PMD regarding ostomy issues.    Presents to ED today with accelerated chest pain radiating to both arms overnight. Troponin is 0.09. No SOB or sweats.  Hgb of 03/01/12 was 11.3. Now it is 8.1.  Takes Aleve 2 to 3 tabs per day, several times weekly for toe pain. Also on  Plavix and low dose ASA No PPI etc on med list. No hx of PUD or upper GIB    Past Medical History  Diagnosis Date  . Coronary artery disease     a. h/o NSTEMI 2012 in setting of AFib/perf'd divertic (in [place]. - coded, CPR, intubation during hospital stay);  b.  MV 2/13: EF 57%, mod isch septum and poss small part of the Ant wall;  c.  LHC 04/06/11: diffuse 3v CAD no high grade lesions amenable to PCI, surgery would be incomplete thus med rx recommended. d. NSTEMI 02/2012 managed medically.  . Atrial fibrillation     in setting of sepsis in [place]; tx with amiodarone (amio stopped 04/20/11);    Marland Kitchen Injury to liver   . Allergic rhinitis due to pollen   . Hyperlipidemia   . Hypertension   . Vitamin D deficiency   . Anemia, unspecified   . Arthritis   . Diabetes mellitus     borderline  . Diverticulitis of colon with perforation s/p colectomy/ostomy 13Oct2012  02/23/2011    s/p colostomy  . Carotid stenosis     a.  02/2011 - RICA 80-99% and LICA 60-79%;  b. s/p R CEA Dr. Darrick Penna 09/2011  . Complication of anesthesia     Allergie to narcotics and abx.  . LV dysfunction     a.  Echo 10/2010 (in setting of NSTEMI at [facility] in [facility]):  mid ot apical septal AK, EF 40-45%, MAC, mild to mod MR, mild TR, no pulmo HTN;  b. Echo 02/2012: EF 45-50% with WMA, mild-mod MR.  Marland Kitchen PAD (peripheral artery disease)     a. ABIs 9/13: R 0.55; L 0.93;  b. arterial insufficiency ulcers on right foot;  followed by  [facility];  c. angiogram 01/2012 with severe bilat SFA and pop disease => not good bypass candidate and PTA not an option => prob continue conservative mgmt  . Hypothyroid   . Myocardial infarction Feb. 1, 2014  . Fall at home Feb. 6, 2014    Left leg     Past Surgical History  Procedure Laterality Date  . Appendectomy    . Total hip arthroplasty  07/2004    Right  . Colostomy  10/2010  . Cesarean section  1955, 1959, 1960  . Abdominal hysterectomy  1976  . Bladder repair  1976  . Abdominal adhesion surgery      at time of hysterectomy  . Joint replacement  2006    Right  Hip  . Endarterectomy  10/20/2011    Procedure: ENDARTERECTOMY CAROTID;  Surgeon: Sherren Kerns, MD;  Location: [facility] OR;  Service: Vascular;  Laterality: Right;  Right Carotid Endarterectomy with patch angioplasty  . Carotid endarterectomy      Prior to Admission medications   Medication Sig Start Date End Date Taking? Authorizing Provider  acetaminophen-codeine (TYLENOL #3) 300-30 MG per tablet Take 1-2 tablets by mouth every 4 (four) hours as needed. For pain 10/21/11  Yes Regina J Roczniak, PA-C  amLODipine (NORVASC) 5 MG tablet Take 1 tablet (5 mg total) by mouth 2 (two) times daily. 02/24/12  Yes Beatrice Lecher, PA-C  aspirin EC 81 MG tablet Take 81 mg by mouth daily.  04/20/11  Yes Scott T Alben Spittle, PA-C  Calcium Carbonate-Vitamin D (CALCIUM 600 + D PO) Take 1 tablet by mouth 2 (two) times daily.   Yes Historical Provider, MD  clopidogrel (PLAVIX) 75 MG tablet Take 1 tablet (75 mg total) by mouth daily with breakfast. 03/01/12  Yes Ok Anis, NP  DiphenhydrAMINE HCl (BENADRYL PO) Take 30 mLs by mouth as needed. For cough   Yes Historical Provider, MD  ezetimibe (ZETIA) 10 MG tablet Take 1 tablet (10 mg total) by mouth daily. 04/06/12  Yes Rollene Rotunda, MD  levothyroxine (SYNTHROID, LEVOTHROID) 25 MCG tablet Take 25 mcg by mouth daily.   Yes Historical Provider, MD  Melatonin 5 MG CAPS Take 5 mg by mouth at  bedtime as needed. To help sleep   Yes Historical Provider, MD  metFORMIN (GLUCOPHAGE) 500 MG tablet Take 500 mg by mouth daily.   Yes Historical Provider, MD  Methylsulfonylmethane (MSM) 1000 MG TABS Take 1,000 mg by mouth daily.    Yes Historical Provider, MD  metoprolol tartrate (LOPRESSOR) 50 MG tablet Take 1 tablet (50 mg total) by mouth 2 (two) times daily. 03/01/12  Yes Ok Anis, NP  Multiple Vitamins-Minerals (ADULT GUMMY PO) Take 2 tablets by mouth daily.   Yes Historical Provider, MD  nitroGLYCERIN (NITRODUR -  DOSED IN MG/24 HR) 0.4 mg/hr Place 1 patch (0.4 mg total) onto the skin daily. 03/01/12  Yes Ok Anis, NP  nitroGLYCERIN (NITROSTAT) 0.4 MG SL tablet Place 1 tablet (0.4 mg total) under the tongue every 5 (five) minutes x 3 doses as needed. For chest pain 10/02/11  Yes Ok Anis, NP  ranolazine (RANEXA) 500 MG 12 hr tablet Take 1 tablet (500 mg total) by mouth 2 (two) times daily. 03/01/12  Yes Ok Anis, NP    Scheduled Meds: . fentaNYL  50 mcg Intravenous Once  . furosemide  40 mg Intravenous Once   Infusions:   PRN Meds:    Allergies as of 05/20/2012 - Review Complete 05/20/2012  Allergen Reaction Noted  . Demerol Other (See Comments) 02/19/2011  . Codeine Swelling and Rash 10/13/2011  . Benzoin  09/30/2011  . Cephalexin  02/18/2011  . Cephalosporins  02/18/2011  . Contrast media (iodinated diagnostic agents)  02/18/2011  . Dilaudid (hydromorphone hcl)  09/30/2011  . Insulins Swelling 02/18/2011  . Lisinopril  10/06/2011  . Morphine and related  02/19/2011  . Other  02/23/2011  . Penicillins  02/18/2011  . Sulfa antibiotics  02/18/2011  . Valium (diazepam)  02/26/2012  . Vistaril (hydroxyzine hcl)  02/27/2012  . Aspirin  02/18/2011  . Latex Rash 02/18/2011    Family History  Problem Relation Age of Onset  . Heart disease Mother 39    Pacemaker, No CAD Heart Disease before age 52  . Diverticulitis Mother   . Diabetes  Mother   . Hyperlipidemia Mother   . Hypertension Mother   . Lung disease Father 79    Smoking  . Ovarian cancer Daughter   . Cancer Daughter   . Colon cancer Neg Hx   . Heart disease Maternal Grandmother   . Diabetes Maternal Aunt     x 2  . Diabetes Sister   . Heart disease Sister     Heart Disease before age 37  . Hyperlipidemia Sister   . Hypertension Sister     History   Social History  . Marital Status: Widowed since 2012    Spouse Name: N/A    Number of Children: N/A  . Years of Education: N/A   Occupational History  . Not on file.   Social History Main Topics  . Smoking status: Never Smoker   . Smokeless tobacco: Never Used  . Alcohol Use: No  . Drug Use: No  . Sexually Active: No    REVIEW OF SYSTEMS: Travels between [place], [place], [place] staying with her various children No SOB.  No PND + malaise and sleepiness No peripheral edema Chronic right toe pain. No headache.  No sinus infection.  No nose bleeds No blurry vision, no falls No rash or itching.  PHYSICAL EXAM: Vital signs in last 24 hours: Temp:  [97.9 F (36.6 C)-98.6 F (37 C)] 98.5 F (36.9 C) (04/25 0915) Pulse Rate:  [59-71] 71 (04/25 0547) Resp:  [16-23] 16 (04/25 0547) BP: (129-152)/(47-93) 129/93 mmHg (04/25 0547) SpO2:  [97 %-100 %] 98 % (04/25 0547) Weight:  [62.143 kg (137 lb)] 62.143 kg (137 lb) (04/24 1011)  General: petite WF who is talkative, alert and comfortable Head:  Normocephalic, symmetric  Eyes:  No icterus, conjunctiva moderately pale.  Ears:  Not HOH  Nose:  No congestion or sneezing Mouth:  Dry tongue, clear and pink MM Neck:  No JVD, no bruits, well healed CEA scar on right.  Lungs:  Clear.  No cough, no dyspnea Heart: RRR.  2-3/6 systolic murmer Abdomen:  Soft, not tender.  Active BS.  No distention.  Ostomy bag on left with dark brown watery but small amount of stool.   Rectal: not done   Musc/Skeltl: no joint deformity, redness or  swelling Extremities:  Non-pitting minor swelling in left foot dorsum.  Cool but not cold or cyanotic right foot.  Tender ulcer at dorsum right 5th toe.   Neurologic:  No tremor, rambling but accurate historian.  No tremor, no gross limb  Weakness.  Oriented x 3. .  Skin:  No rash, no telangectasia, no palmar erythema.  Sore on right toe Tattoos:  none Nodes:  No inguinal or cervical adenopathy   Psych:  Pleasant, cooperative, anxious but not agitated.   Intake/Output from previous day:   Intake/Output this shift:    LAB RESULTS:  Recent Labs  05/20/12 0503  WBC 9.6  HGB 8.1*  HCT 23.5*  PLT 279  MCV    83   BMET Lab Results  Component Value Date   NA 135 05/20/2012   NA 134* 02/29/2012   NA 138 02/28/2012   K 3.9 05/20/2012   K 4.9 02/29/2012   K 4.6 02/28/2012   CL 101 05/20/2012   CL 102 02/29/2012   CL 107 02/28/2012   CO2 22 05/20/2012   CO2 23 02/29/2012   CO2 22 02/28/2012   GLUCOSE 172* 05/20/2012   GLUCOSE 142* 02/29/2012   GLUCOSE 128* 02/28/2012   BUN 25* 05/20/2012   BUN 27* 02/29/2012   BUN 27* 02/28/2012   CREATININE 1.01 05/20/2012   CREATININE 0.93 02/29/2012   CREATININE 0.78 02/28/2012   CALCIUM 9.2 05/20/2012   CALCIUM 9.4 02/29/2012   CALCIUM 9.5 02/28/2012   LFT No results found for this basename: PROT, ALBUMIN, AST, ALT, ALKPHOS, BILITOT, BILIDIR, IBILI,  in the last 72 hours  PT/INR None obtained.    RADIOLOGY STUDIES: Dg Chest Port 1 View 05/20/2012    IMPRESSION: Heart size upper normal with central vascular congestion.  No overt edema.  Mild left lung base opacity, favor atelectasis.   Original Report Authenticated By: Jearld Lesch, M.D.     ENDOSCOPIC STUDIES: 02/2011   Colonoscopy via colostomy and Luz Brazen pouch inspection via anus. By  Dr Christella Hartigan  For pre colostomy take down study: Sigmoid and descending colon tics.   IMPRESSION: *  Upper GI bleed.  Rule out NSAID (Aleve/ASA) induced ulcer *  Chest pain, ? SE MI.  Severe inoperable CAD with chronic  angina. On chronic Plavix.  *  ABL anemia, receiving first unit of PRBC now.  Normocytic though MCV down c/w 02/2012.  *  S/p Colostomy and sigmoid resection for diverticulitis in 2012.  Diverticulosis on Colonoscopy in 2013.  *  Right toe ulcer due to PVD, painful. *  Mild azotemia, chronic.  *  Multiple drug intolerances and allergies.  Wishes to discuss any meds for sedation with MD.  *  NIDDM, type 2.   PLAN: *  EGD.  She has been NPO.  If no EGD today, will let her eat. Given use of Plavix may require delay of procedure *  Start Protonix.  Agree with BID IV Protonix. Hold Plavix, avoid heparin/lovenox etc.     LOS: 0 days   Jennye Moccasin  05/20/2012, 9:20 AM Pager: [phone].  Chart was reviewed and patient was examined. X-rays and lab were reviewed.    I agree  with management and plans. Patient is having GI bleeding, most likely from upper GI source that his NSAID-related. Plan upper endoscopy.  Barbette Hair. Arlyce Dice, M.D., [facility] Cell [phone]

## 2012-05-20 NOTE — OR Nursing (Signed)
Spoke to pharmacy pre-procedure in regards to pt's allergies and sedation medications.  Pharmacist Nita recommended Lidocaine and small doses of Fentanyl and Versed.  Will notify Dr. Arlyce Dice and proceed with procedure. Ennis Forts, RN

## 2012-05-21 DIAGNOSIS — I959 Hypotension, unspecified: Secondary | ICD-10-CM | POA: Diagnosis present

## 2012-05-21 DIAGNOSIS — K254 Chronic or unspecified gastric ulcer with hemorrhage: Secondary | ICD-10-CM

## 2012-05-21 LAB — BASIC METABOLIC PANEL
CO2: 24 mEq/L (ref 19–32)
Calcium: 9.3 mg/dL (ref 8.4–10.5)
Creatinine, Ser: 0.95 mg/dL (ref 0.50–1.10)
Glucose, Bld: 166 mg/dL — ABNORMAL HIGH (ref 70–99)

## 2012-05-21 LAB — LIPID PANEL
LDL Cholesterol: 141 mg/dL — ABNORMAL HIGH (ref 0–99)
Total CHOL/HDL Ratio: 6.6 RATIO
Triglycerides: 282 mg/dL — ABNORMAL HIGH (ref <150)
VLDL: 56 mg/dL — ABNORMAL HIGH (ref 0–40)

## 2012-05-21 LAB — CBC
HCT: 29.5 % — ABNORMAL LOW (ref 36.0–46.0)
HCT: 29 % — ABNORMAL LOW (ref 36.0–46.0)
MCH: 29.1 pg (ref 26.0–34.0)
MCH: 28.7 pg (ref 26.0–34.0)
MCV: 82.4 fL (ref 78.0–100.0)
MCV: 83.3 fL (ref 78.0–100.0)
Platelets: 312 10*3/uL (ref 150–400)
Platelets: 329 10*3/uL (ref 150–400)
RBC: 3.48 MIL/uL — ABNORMAL LOW (ref 3.87–5.11)
RDW: 14.3 % (ref 11.5–15.5)
RDW: 14.3 % (ref 11.5–15.5)

## 2012-05-21 LAB — TROPONIN I: Troponin I: 9.97 ng/mL (ref <0.30)

## 2012-05-21 MED ORDER — SUCRALFATE 1 GM/10ML PO SUSP
1.0000 g | Freq: Four times a day (QID) | ORAL | Status: DC
  Administered 2012-05-21 – 2012-05-23 (×9): 1 g via ORAL
  Filled 2012-05-21 (×12): qty 10

## 2012-05-21 MED ORDER — LIDOCAINE 4 % EX CREA
TOPICAL_CREAM | Freq: Two times a day (BID) | CUTANEOUS | Status: DC
  Administered 2012-05-21 – 2012-05-23 (×5): 1 via TOPICAL
  Filled 2012-05-21 (×2): qty 5

## 2012-05-21 NOTE — Progress Notes (Signed)
Patient ID: Julia Greene, female   DOB: [birth year], 79 y.o.   MRN: [id number]   Please see the complete consult note done by Dr. Tenny Craw of cardiology yesterday.  Jerral Bonito, MD

## 2012-05-21 NOTE — Progress Notes (Signed)
Patient ID: Julia Greene, female   DOB: 1930/10/21, 79 y.o.   MRN: [id number] [facility] Progress Note  Subjective: Talkative and says feeling much better. No c/o abdominal  pain, able to eat. Ostomy functioning,old dark stool.   No further chest pain  Objective:  Vital signs in last 24 hours: Temp:  [97.3 F (36.3 C)-98.8 F (37.1 C)] 98.1 F (36.7 C) (04/26 0814) Pulse Rate:  [65-83] 65 (04/26 0814) Resp:  [9-20] 9 (04/26 0814) BP: (86-145)/(29-70) 92/40 mmHg (04/26 0814) SpO2:  [93 %-100 %] 99 % (04/26 0814) Weight:  [136 lb 11 oz (62 kg)] 136 lb 11 oz (62 kg) (04/26 0425) Last BM Date: 05/20/12 General:   Alert,  Well-developed, eldelry WF   in NAD Heart:  Regular rate and rhythm; no murmurs Pulm;clear Abdomen:  Soft, nontender and nondistended. Normal bowel sounds, without guarding, and without rebound.Ostomy with dark stool   Extremities:  Without edema.ischemic ulcer on 5th toe right foot Neurologic:  Alert and  oriented x4;  grossly normal neurologically. Psych:  Alert and cooperative. Normal mood and affect.  Intake/Output from previous day: 04/25 0701 - 04/26 0700 In: 228 [I.V.:228] Out: 1900 [Urine:1900] Intake/Output this shift:    Lab Results:  Recent Labs  05/20/12 0503 05/20/12 1737 05/21/12 0520  WBC 9.6 10.2 10.0  HGB 8.1* 10.3* 10.4*  HCT 23.5* 29.0* 29.5*  PLT 279 256 312   BMET  Recent Labs  05/20/12 0503 05/21/12 0520  NA 135 137  K 3.9 3.8  CL 101 100  CO2 22 24  GLUCOSE 172* 166*  BUN 25* 16  CREATININE 1.01 0.95  CALCIUM 9.2 9.3    Assessment / Plan: #1 79 yo female with acute NSTEMI- in setting of known severe CAD #2 Gi bleed secondary to multiple gastric ulcers-likely  NSAID induced- No endoscopic RX indicated #3 Anemia - blood loss -hgb stable and improved #4 PVD with ischemic ulcer on toe-chronic pain  stable from GI standpoint- Need to continue BID PPI, and add carafate for a few weeks- main problem is NSAID  use due to severe foot pain- OK to use an analgesic -just NO NSAIDs  Transfuse to keep hgb 9-10 range Will follow up Hpylori GI will sign off, available if needed.  Principal Problem:   NSTEMI (non-ST elevated myocardial infarction) Active Problems:   Atrial fibrillation   HTN (hypertension)   Diverticulitis of colon with perforation s/p colectomy/ostomy 13Oct2012   Cardiomyopathy   Occlusion and stenosis of carotid artery without mention of cerebral infarction   Coronary artery disease   Foot ulcer, right   Peripheral arterial disease   GI bleed   Gastric ulcer with hemorrhage   Stricture and stenosis of esophagus     LOS: 1 day   Amy Esterwood  05/21/2012, 10:47 AM   GI ATTENDING  Patient personally seen and examined. Agree with history and physical as outlined above. She is doing well post endoscopy. Results reviewed with her. Ulcers felt secondary to NSAIDs. Recommend avoidance of NSAIDs (except for cardioprotective aspirin if needed) And twice a day PPI. Would treat for Helicobacter pylori if the biopsy positive. Please call for any questions or problems. Will sign off.  Wilhemina Bonito. Eda Keys., M.D. [facility] Division of Gastroenterology

## 2012-05-21 NOTE — Progress Notes (Signed)
[facility] PROGRESS NOTE  Julia Greene ZOX:[id number] DOB: 08/07/1930 DOA: 05/20/2012 PCP: Oneal Grout, MD Brief narrative 79 y/o female with severe three-vessel disease not amenable to PCI or CABG, mild CHF with EF of 45%, diabetes mellitus, severe peripheral artery disease with a right fifth toe ulcer, history of diverticulitis with perforation and abscess status post colostomy, carotid stenosis status post carotid endarterectomy in 2013, hospitalization for NSTEMI ( last admitted 2 months back with plan on medical management) presented with acute onset chest pain since one day with findings off and standing. Patient also had two-week history of bleeding from the colostomy site.  Assessment/plan NSTEMI (non-ST elevated myocardial infarction)  Continue step down monitoring  Hold ASA , plavix and ranexa given acute GI bleed. Hold anticoagulation for the same.  Chest pain now resolved. Markedly elevated serial troponins( 11 this am)  . Continue nitropaste for chest pain. Prn fentanyl for severe pain. Her pressure has been quite soft. Holding home blood pressure medications. Resume low-dose metoprolol if stable. Appreciate cardiology consult. Patient has severe CAD with 3 vessel disease on cath not a candidate for surgery and not a high grade lesion For PCI. Patient admitted 2 months back for NSTEMI and managed medically.  -Cardiology following. Check lipid panel.  Active Problems:  GI bleed  Patient noted for almost 3 grams drop in H&H from recent labs. Informs taking 2-3 tablets of aleve almost daily for pain. Had bleeding from colostomy bag on 2 occasions and 3 episodes of hematemesis 5 days back. Continue IV PPI. transfused 2 u PRBC to keep hb >10 given severe CAD.  GI consulted and had EGD done on admission showing multiple gastric ulcer. Recommend to continue twice a day PPI. She had colonoscopy via colostomy in 11/13 by lebeaur Gi showing mild diverticulosis only.  Hemoglobin  improved to 10.4 this a.m.  HTN (hypertension)  BP low. According all pressure medications.  Diverticulitis of colon with perforation s/p colectomy/ostomy Oct2012  Has colostomy with issues of constipation for past few weeks and bleeding from the stoma on 2 occasions. Reports normal output this am.   Cardiomyopathy  EF of 45 % . Currently euvolemic   Carotid artery stenosis  Follows with Dr Darrick Penna. S/p Rt CEA in 2013. Patet carotid on exam  at Dr Darrick Penna clinic on 4/24.   Foot ulcer, right  Secondary to severe PAD. Follows with Dr Darrick Penna. Saw patient on 4/24  and noted for severe PAD on ABI with right 5th toe ulcer. He discussed about amputation of the 5th toe with the patient as well. Recommended local dressing for now. She has significant pain and although fentanyl helps , her low BP is a concern. i will apply some topical analgesic for now.  Diabetes mellitus  on metformin which ison held. Place on SSI   Hypothyroidism  on synthroid . check TSH  DVT prophylaxis: SCD  Diet: diabetic  Code Status: full code  Family Communication: none  at bedside today Disposition Plan: currently inpatient  HPI/Subjective: Patient denies any further chest pain. EGD done yesterday showing multiple gastric ulcers. Labs showing markedly elevated troponins. Blood pressure noted to be low .  Objective: Filed Vitals:   05/21/12 0030 05/21/12 0425 05/21/12 0800 05/21/12 0814  BP: 111/48 103/29 86/46 92/40   Pulse: 68 65 65 65  Temp: 97.6 F (36.4 C) 97.8 F (36.6 C)  98.1 F (36.7 C)  TempSrc: Axillary Axillary  Axillary  Resp: 18 18 11 9   Weight:  62 kg (  136 lb 11 oz)    SpO2: 96% 97% 98% 99%    Intake/Output Summary (Last 24 hours) at 05/21/12 1039 Last data filed at 05/21/12 0300  Gross per 24 hour  Intake    228 ml  Output   1900 ml  Net  -1672 ml   Filed Weights   05/21/12 0425  Weight: 62 kg (136 lb 11 oz)    Exam:   General:  Elderly female in no acute distress  HEENT:  No pallor, moist oral mucosa  Chest: Clear to auscultation bilaterally, no added sounds  CVS: Normal S1 and S2, no murmurs or gallop  Extremities: Warm, no edema, absent dorsalis pedis pulses on exam bilaterally wit a 5 mm tender ulcer over dorsum of right great toe.  CNS: AAO x3  Data Reviewed: Basic Metabolic Panel:  Recent Labs Lab 05/20/12 0503 05/21/12 0520  NA 135 137  K 3.9 3.8  CL 101 100  CO2 22 24  GLUCOSE 172* 166*  BUN 25* 16  CREATININE 1.01 0.95  CALCIUM 9.2 9.3   Liver Function Tests: No results found for this basename: AST, ALT, ALKPHOS, BILITOT, PROT, ALBUMIN,  in the last 168 hours No results found for this basename: LIPASE, AMYLASE,  in the last 168 hours No results found for this basename: AMMONIA,  in the last 168 hours CBC:  Recent Labs Lab 05/20/12 0503 05/20/12 1737 05/21/12 0520  WBC 9.6 10.2 10.0  HGB 8.1* 10.3* 10.4*  HCT 23.5* 29.0* 29.5*  MCV 83.9 82.6 82.4  PLT 279 256 312   Cardiac Enzymes:  Recent Labs Lab 05/20/12 1737 05/21/12 0011 05/21/12 0520  TROPONINI 3.62* 9.97* 11.01*   BNP (last 3 results)  Recent Labs  02/27/12 1811 05/20/12 0503  PROBNP 1627.0* 1481.0*   CBG:  Recent Labs Lab 05/20/12 1437  GLUCAP 128*    Recent Results (from the past 240 hour(s))  MRSA PCR SCREENING     Status: Abnormal   Collection Time    05/20/12 12:10 PM      Result Value Range Status   MRSA by PCR POSITIVE (*) NEGATIVE Final   Comment:            The GeneXpert MRSA Assay (FDA     approved for NASAL specimens     only), is one component of a     comprehensive MRSA colonization     surveillance program. It is not     intended to diagnose MRSA     infection nor to guide or     monitor treatment for     MRSA infections.     RESULT CALLED TO, READ BACK BY AND VERIFIED WITH:     C WHITE,RN 05/20/12 1403 BY K SCHULTZ     Studies: Dg Chest Port 1 View  05/20/2012  *RADIOLOGY REPORT*  Clinical Data: Chest pain  PORTABLE  CHEST - 1 VIEW  Comparison: 02/27/2012  Findings: Heart size upper normal. Mild central vascular congestion.  Aortic tortuosity atherosclerosis.  Interstitial coarsening/senescent changes.  Mild left lung base opacity.  No pleural effusion or pneumothorax.  Osteopenia.  Rightward curvature of the spine.  IMPRESSION: Heart size upper normal with central vascular congestion.  No overt edema.  Mild left lung base opacity, favor atelectasis.   Original Report Authenticated By: Jearld Lesch, M.D.     Scheduled Meds: . Chlorhexidine Gluconate Cloth  6 each Topical [id number]  . mupirocin ointment  1 application Nasal BID  . nitroGLYCERIN  0.4 mg Transdermal Daily  . pantoprazole (PROTONIX) IV  40 mg Intravenous Q12H  . sodium chloride  3 mL Intravenous Q12H   Continuous Infusions:     Time spent: 35 minutes    Julia Greene  [facility] Pager [phone]. If 7PM-7AM, please contact night-coverage at [email or web address], password [facility] 05/21/2012, 10:39 AM  LOS: 1 day

## 2012-05-22 DIAGNOSIS — I739 Peripheral vascular disease, unspecified: Secondary | ICD-10-CM

## 2012-05-22 LAB — CBC
MCH: 29.3 pg (ref 26.0–34.0)
MCH: 28.9 pg (ref 26.0–34.0)
MCHC: 34.9 g/dL (ref 30.0–36.0)
MCV: 82.7 fL (ref 78.0–100.0)
MCV: 82.6 fL (ref 78.0–100.0)
Platelets: 310 10*3/uL (ref 150–400)
Platelets: 340 10*3/uL (ref 150–400)
RBC: 3.52 MIL/uL — ABNORMAL LOW (ref 3.87–5.11)
RBC: 3.57 MIL/uL — ABNORMAL LOW (ref 3.87–5.11)
RDW: 14.3 % (ref 11.5–15.5)
RDW: 14.2 % (ref 11.5–15.5)

## 2012-05-22 LAB — TROPONIN I
Troponin I: 6.07 ng/mL (ref <0.30)
Troponin I: 3.53 ng/mL (ref <0.30)

## 2012-05-22 MED ORDER — HYDROCODONE-ACETAMINOPHEN 5-325 MG PO TABS
1.0000 | ORAL_TABLET | Freq: Four times a day (QID) | ORAL | Status: DC | PRN
  Administered 2012-05-22 – 2012-05-23 (×3): 1 via ORAL
  Filled 2012-05-22 (×3): qty 1

## 2012-05-22 NOTE — Progress Notes (Signed)
[facility] PROGRESS NOTE  CAILEE BLANKE AOZ:[id number] DOB: April 24, 1930 DOA: 05/20/2012 PCP: Oneal Grout, MD  Brief narrative  79 y/o female with severe three-vessel disease not amenable to PCI or CABG, mild CHF with EF of 45%, diabetes mellitus, severe peripheral artery disease with a right fifth toe ulcer, history of diverticulitis with perforation and abscess status post colostomy, carotid stenosis status post carotid endarterectomy in 2013, hospitalization for NSTEMI ( last admitted 2 months back with plan on medical management) presented with acute onset chest pain since one day with findings off and standing. Patient also had two-week history of bleeding from the colostomy site.   Assessment/plan  NSTEMI (non-ST elevated myocardial infarction)  Admitted to stepdown Held ASA , plavix and ranexa given acute GI bleed. Held anticoagulation for the same.  Chest pain now resolved. Markedly elevated serial troponins( peaked to 11, now trending down) . Continue nitropaste for chest pain. Prn fentanyl for severe pain. Low BP since admission, now better.  Holding home blood pressure medications. Resume low-dose metoprolol if stable.  Appreciate cardiology consult. Patient has severe CAD with 3 vessel disease on cath not a candidate for surgery and not a high grade lesion For PCI. Patient admitted 2 months back for NSTEMI and managed medically.  -Cardiology following.  -transfer to telemetry.  Active Problems:  GI bleed  Patient noted for almost 3 grams drop in H&H from recent labs. Informs taking 2-3 tablets of aleve almost daily for pain. Had bleeding from colostomy bag on 2 occasions and 3 episodes of hematemesis 5 days back. Continue IV PPI. transfused 2 u PRBC to keep hb >10 given severe CAD.  GI consulted and had EGD done on admission showing multiple gastric ulcer. Recommend to continue twice a day PPI.  She had colonoscopy via colostomy in 11/13 by lebeaur Gi showing mild  diverticulosis only.  Hemoglobin  stable at 10.  HTN (hypertension)  holding all blood pressure medications as BP soft.   Diverticulitis of colon with perforation s/p colectomy/ostomy Oct2012  Has colostomy with issues of constipation for past few weeks and bleeding from the stoma on 2 occasions. normal output since admission.   Cardiomyopathy  EF of 45 % . Currently euvolemic.   Carotid artery stenosis  Follows with Dr Darrick Penna. S/p Rt CEA in 2013. Repeat carotid doppler stable  at Dr Darrick Penna clinic on 4/24.   Foot ulcer, right  Secondary to severe PAD. Follows with Dr Darrick Penna. Saw patient on 4/24 and noted for severe PAD on ABI with right 5th toe ulcer. He discussed about amputation of the 5th toe with the patient as well. Recommended local dressing for now.  will add Vicodin which patient says will tolerate.   Diabetes mellitus  on metformin which is on hold.  on SSI   Hypothyroidism  on synthroid . check TSH   DVT prophylaxis: SCD   Diet: diabetic  Code Status: full code  Family Communication: none at bedside today  Disposition Plan: transfer to tele. May d/c home tomorrow is stable.    HPI/Subjective: No chest pan or GI bleed further. stable overngiht  Objective: Filed Vitals:   05/22/12 0800 05/22/12 0900 05/22/12 1000 05/22/12 1100  BP: 145/69 121/55 153/62 117/67  Pulse:  89 74 84  Temp:    98.3 F (36.8 C)  TempSrc:    Oral  Resp: 22 17 17 18   Height:      Weight:      SpO2: 99% 98% 99% 99%  Intake/Output Summary (Last 24 hours) at 05/22/12 1143 Last data filed at 05/22/12 1100  Gross per 24 hour  Intake   1200 ml  Output   1425 ml  Net   -225 ml   Filed Weights   05/21/12 0425 05/21/12 1727  Weight: 62 kg (136 lb 11 oz) 62 kg (136 lb 11 oz)    Exam:  General: Elderly female in no acute distress  HEENT: No pallor, moist oral mucosa  Chest: Clear to auscultation bilaterally, no added sounds  CVS: Normal S1 and S2, no murmurs or gallop   Extremities: Warm, no edema, absent dorsalis pedis pulses on exam bilaterally with a 5 mm tender ulcer over dorsum of right great toe.  CNS: AAO x3  Data Reviewed: Basic Metabolic Panel:  Recent Labs Lab 05/20/12 0503 05/21/12 0520  NA 135 137  K 3.9 3.8  CL 101 100  CO2 22 24  GLUCOSE 172* 166*  BUN 25* 16  CREATININE 1.01 0.95  CALCIUM 9.2 9.3   Liver Function Tests: No results found for this basename: AST, ALT, ALKPHOS, BILITOT, PROT, ALBUMIN,  in the last 168 hours No results found for this basename: LIPASE, AMYLASE,  in the last 168 hours No results found for this basename: AMMONIA,  in the last 168 hours CBC:  Recent Labs Lab 05/20/12 0503 05/20/12 1737 05/21/12 0520 05/21/12 1752 05/22/12 0500  WBC 9.6 10.2 10.0 10.2 9.6  HGB 8.1* 10.3* 10.4* 10.0* 10.3*  HCT 23.5* 29.0* 29.5* 29.0* 29.1*  MCV 83.9 82.6 82.4 83.3 82.7  PLT 279 256 312 329 310   Cardiac Enzymes:  Recent Labs Lab 05/21/12 1030 05/21/12 1752 05/21/12 2257 05/22/12 0610 05/22/12 1030  TROPONINI 11.20* 8.76* 6.88* 6.07* 3.23*   BNP (last 3 results)  Recent Labs  02/27/12 1811 05/20/12 0503  PROBNP 1627.0* 1481.0*   CBG:  Recent Labs Lab 05/20/12 1437  GLUCAP 128*    Recent Results (from the past 240 hour(s))  MRSA PCR SCREENING     Status: Abnormal   Collection Time    05/20/12 12:10 PM      Result Value Range Status   MRSA by PCR POSITIVE (*) NEGATIVE Final   Comment:            The GeneXpert MRSA Assay (FDA     approved for NASAL specimens     only), is one component of a     comprehensive MRSA colonization     surveillance program. It is not     intended to diagnose MRSA     infection nor to guide or     monitor treatment for     MRSA infections.     RESULT CALLED TO, READ BACK BY AND VERIFIED WITH:     C WHITE,RN 05/20/12 1403 BY K SCHULTZ     Studies: No results found.  Scheduled Meds: . Chlorhexidine Gluconate Cloth  6 each Topical Q0600  . lidocaine    Topical BID  . mupirocin ointment  1 application Nasal BID  . nitroGLYCERIN  0.4 mg Transdermal Daily  . pantoprazole (PROTONIX) IV  40 mg Intravenous Q12H  . sodium chloride  3 mL Intravenous Q12H  . sucralfate  1 g Oral Q6H   Continuous Infusions:     Time spent: *25 minutes    Steed Kanaan  [facility] Pager [phone]. If 7PM-7AM, please contact night-coverage at [email or web address], password [facility] 05/22/2012, 11:43 AM  LOS: 2 days

## 2012-05-23 ENCOUNTER — Ambulatory Visit: Payer: Medicare Other | Admitting: Cardiology

## 2012-05-23 ENCOUNTER — Encounter (HOSPITAL_COMMUNITY): Payer: Self-pay | Admitting: Gastroenterology

## 2012-05-23 ENCOUNTER — Other Ambulatory Visit (HOSPITAL_COMMUNITY): Payer: Self-pay | Admitting: Nurse Practitioner

## 2012-05-23 ENCOUNTER — Other Ambulatory Visit: Payer: Medicare Other

## 2012-05-23 LAB — CBC
MCH: 28.9 pg (ref 26.0–34.0)
MCHC: 34.9 g/dL (ref 30.0–36.0)
MCV: 82.9 fL (ref 78.0–100.0)
Platelets: 312 10*3/uL (ref 150–400)
RDW: 14 % (ref 11.5–15.5)

## 2012-05-23 LAB — TROPONIN I: Troponin I: 2.57 ng/mL (ref <0.30)

## 2012-05-23 MED ORDER — MAGNESIUM OXIDE 400 (241.3 MG) MG PO TABS
400.0000 mg | ORAL_TABLET | Freq: Once | ORAL | Status: AC
  Administered 2012-05-23: 400 mg via ORAL
  Filled 2012-05-23: qty 1

## 2012-05-23 MED ORDER — METOPROLOL TARTRATE 50 MG PO TABS: 25.0000 mg | ORAL_TABLET | Freq: Two times a day (BID) | ORAL | Status: DC

## 2012-05-23 MED ORDER — SUCRALFATE 1 GM/10ML PO SUSP: 1.0000 g | Freq: Four times a day (QID) | ORAL | Status: DC

## 2012-05-23 MED ORDER — POTASSIUM CHLORIDE CRYS ER 20 MEQ PO TBCR
40.0000 meq | EXTENDED_RELEASE_TABLET | Freq: Once | ORAL | Status: AC
  Administered 2012-05-23: 40 meq via ORAL

## 2012-05-23 MED ORDER — ASPIRIN EC 81 MG PO TBEC
81.0000 mg | DELAYED_RELEASE_TABLET | Freq: Every day | ORAL | Status: DC
  Filled 2012-05-23: qty 1

## 2012-05-23 MED ORDER — MAGNESIUM SULFATE 40 MG/ML IJ SOLN
2.0000 g | Freq: Once | INTRAMUSCULAR | Status: DC
  Filled 2012-05-23: qty 50

## 2012-05-23 MED ORDER — PANTOPRAZOLE SODIUM 40 MG PO TBEC
40.0000 mg | DELAYED_RELEASE_TABLET | Freq: Every day | ORAL | Status: DC
Start: 2012-05-23 — End: 2012-07-05

## 2012-05-23 NOTE — Discharge Summary (Addendum)
Physician Discharge Summary  Julia Greene BJY:[id number] DOB: August 31, 1930 DOA: 05/20/2012  PCP: Oneal Grout, MD  Admit date: 05/20/2012 Discharge date: 05/23/2012  Time spent: 40 minutes  Recommendations for Outpatient Follow-up:  Home with outpatient PCP follow up in 1 week. Check Hb during the visit [facility] office will call to schedule appt.  Discharge Diagnoses:   Principal Problem:   NSTEMI (non-ST elevated myocardial infarction)  Active Problems:     Upper GI bleed   Gastric ulcer with hemorrhage   Hypotension, unspecified   HTN (hypertension)   Diverticulitis of colon with perforation s/p colectomy/ostomy 13Oct2012   Cardiomyopathy   Occlusion and stenosis of carotid artery without mention of cerebral infarction   Coronary artery disease   Foot ulcer, right   Peripheral arterial disease Diabetes mellitus   Discharge Condition: fair  Diet recommendation: cardiac  Filed Weights   05/21/12 0425 05/21/12 1727  Weight: 62 kg (136 lb 11 oz) 62 kg (136 lb 11 oz)    History of present illness:  Please refer to admission H&P for details, but in brief, 79 y/o female with severe three-vessel disease not amenable to PCI or CABG, mild CHF with EF of 45%, diabetes mellitus, severe peripheral artery disease with a right fifth toe ulcer, history of diverticulitis with perforation and abscess status post colostomy, carotid stenosis status post carotid endarterectomy in 2013, hospitalization for NSTEMI ( last admitted 2 months back with plan on medical management) presented with acute onset chest pain since one day with findings of NSTEMI. Patient also had two-week history of bleeding from the colostomy site.    Hospital Course:  NSTEMI (non-ST elevated myocardial infarction)  Admitted to stepdown  Held ASA , plavix and ranexa given acute GI bleed. Held anticoagulation for the same.  Chest pain now resolved. Markedly elevated serial troponins( peaked to 11, now  trending down) . Continue nitropaste for chest pain. . Low BP since admission, now better as home BP meds held.Marland KitchenResume low-dose metoprolol upon discharge.Marland Kitchen  Appreciate cardiology consult. Patient has severe CAD with 3 vessel disease on cath not a candidate for surgery and not a high grade lesion For PCI. Patient admitted 2 months back for NSTEMI and managed medically.  -patient remains chest pain free and hemodynamically stable. Resumed ASA. Hold plavix and ranexa given GI bleed.  -follow up with Dr Luciano Cutter as outpatient. Patient 's LDL is 141. She is on zetia for dyslipidemia and should be on statin unless contraindicated. Can be started during her visit as outpatient.  Active Problems:   upper GI bleed  Patient noted for almost 3 grams drop in H&H from recent labs. Informs taking 2-3 tablets of alleve almost daily for pain. Had bleeding from colostomy bag on 2 occasions and 3 episodes of hematemesis 5 days back. Started on IV PPI BID . transfused 2 u PRBC to keep hb >10 given severe CAD.  GI consulted and had EGD done on admission showing multiple gastric ulcer. Recommend to continue twice a day PPI.  She had colonoscopy via colostomy in 11/13 by [facility] showing mild diverticulosis only.  Hemoglobin stable at 10 upon discharge.  -will discharge on protonix 40 mg bid adn Carafate 1 gm q 6hrs. -Patient instructed to avoid NSAIDs.   HTN (hypertension)  held all blood pressure medications as BP low normal. Will discharge on metoprolol 25 mg bid.   Diverticulitis of colon with perforation s/p colectomy/ostomy Oct2012  Has colostomy with issues of constipation for past few weeks  and bleeding from the stoma on 2 occasions. normal output since admission.   Cardiomyopathy  EF of 45 % . Currently euvolemic.   Carotid artery stenosis  Follows with Dr Darrick Penna. S/p Rt CEA in 2013. Repeat carotid doppler stable at Dr Darrick Penna clinic on 4/24.   Foot ulcer, right  Secondary to severe PAD. Follows  with Dr Darrick Penna. Saw patient on 4/24 and noted for severe PAD on ABI with right 5th toe ulcer. He discussed about amputation of the 5th toe with the patient as well. Recommended local dressing for now.  Continue tylenol #3 which she takes at home for pain.   Diabetes mellitus  Resume metformin  Hypothyroidism  on synthroid . TSH wnl  Hypokalemia/ hypomagnesemia  replenished prior to discharge.   Code Status: full code   Disposition Plan: dsicharge home with outpt PCP and cardiology follow up   Procedures:  EGD on 4/25  Consultations:  [facility]   [facility]    Discharge Exam: Filed Vitals:   05/22/12 1200 05/22/12 1423 05/22/12 2100 05/23/12 0557  BP: 95/49 130/68 141/58 119/46  Pulse: 56 93 82 60  Temp:  98.9 F (37.2 C) 98.1 F (36.7 C) 97.3 F (36.3 C)  TempSrc:  Oral Oral Oral  Resp: 26 20 20 18   Height:      Weight:      SpO2: 98% 95% 97% 100%   General: Elderly female in no acute distress  HEENT: No pallor, moist oral mucosa  Chest: Clear to auscultation bilaterally, no added sounds  CVS: Normal S1 and S2, no murmurs or gallop  Abd: soft, NT, ND ,good colostomy output. No bleeding.  Extremities: Warm, no edema, absent dorsalis pedis pulses on exam bilaterally with a 5 mm tender ulcer over dorsum of right great toe.  CNS: AAO x3    Discharge Instructions   Future Appointments Provider Department Dept Phone   06/07/2012 9:50 AM Beatrice Lecher, PA-C [facility] [place]) [phone]   06/16/2012 8:00 AM [facility] [phone]   06/21/2012 11:15 AM Oneal Grout, MD [facility] [phone]   05/25/2013 9:30 AM [facility] [phone]   05/25/2013 10:30 AM Sherren Kerns, MD [facility] [phone]       Medication List    STOP taking these medications       amLODipine 5 MG tablet  Commonly known as:  NORVASC      clopidogrel 75 MG tablet  Commonly known as:  PLAVIX     ranolazine 500 MG 12 hr tablet  Commonly known as:  RANEXA      TAKE these medications       acetaminophen-codeine 300-30 MG per tablet  Commonly known as:  TYLENOL #3  Take 1-2 tablets by mouth every 4 (four) hours as needed. For pain     ADULT GUMMY PO  Take 2 tablets by mouth daily.     aspirin EC 81 MG tablet  Take 81 mg by mouth daily.     BENADRYL PO  Take 30 mLs by mouth as needed. For cough     CALCIUM 600 + D PO  Take 1 tablet by mouth 2 (two) times daily.     ezetimibe 10 MG tablet  Commonly known as:  ZETIA  Take 1 tablet (10 mg total) by mouth daily.     levothyroxine 25 MCG tablet  Commonly known as:  SYNTHROID, LEVOTHROID  Take 25 mcg by mouth daily.     Melatonin 5 MG Caps  Take 5 mg by mouth at bedtime as needed. To help sleep     metFORMIN 500 MG tablet  Commonly known as:  GLUCOPHAGE  Take 500 mg by mouth daily.     metoprolol 50 MG tablet  Commonly known as:  LOPRESSOR  Take 0.5 tablets (25 mg total) by mouth 2 (two) times daily.     MSM 1000 MG Tabs  Take 1,000 mg by mouth daily.     nitroGLYCERIN 0.4 MG SL tablet  Commonly known as:  NITROSTAT  Place 1 tablet (0.4 mg total) under the tongue every 5 (five) minutes x 3 doses as needed. For chest pain     nitroGLYCERIN 0.4 mg/hr  Commonly known as:  NITRODUR - Dosed in mg/24 hr  Place 1 patch (0.4 mg total) onto the skin daily.     pantoprazole 40 MG tablet  Commonly known as:  PROTONIX  Take 1 tablet (40 mg total) by mouth daily.     sucralfate 1 GM/10ML suspension  Commonly known as:  CARAFATE  Take 10 mLs (1 g total) by mouth every 6 (six) hours.           Follow-up Information   Follow up with Rollene Rotunda, MD. (office will call)    Contact information:   [address] [place] [address] [phone]       Follow up with Oneal Grout, MD In 1 week.   Contact  information:   [address] [place] [address] [phone]        The results of significant diagnostics from this hospitalization (including imaging, microbiology, ancillary and laboratory) are listed below for reference.    Significant Diagnostic Studies: Dg Chest Port 1 View  05/20/2012  *RADIOLOGY REPORT*  Clinical Data: Chest pain  PORTABLE CHEST - 1 VIEW  Comparison: 02/27/2012  Findings: Heart size upper normal. Mild central vascular congestion.  Aortic tortuosity atherosclerosis.  Interstitial coarsening/senescent changes.  Mild left lung base opacity.  No pleural effusion or pneumothorax.  Osteopenia.  Rightward curvature of the spine.  IMPRESSION: Heart size upper normal with central vascular congestion.  No overt edema.  Mild left lung base opacity, favor atelectasis.   Original Report Authenticated By: Jearld Lesch, M.D.     Microbiology: Recent Results (from the past 240 hour(s))  MRSA PCR SCREENING     Status: Abnormal   Collection Time    05/20/12 12:10 PM      Result Value Range Status   MRSA by PCR POSITIVE (*) NEGATIVE Final   Comment:            The GeneXpert MRSA Assay (FDA     approved for NASAL specimens     only), is one component of a     comprehensive MRSA colonization     surveillance program. It is not     intended to diagnose MRSA     infection nor to guide or     monitor treatment for     MRSA infections.     RESULT CALLED TO, READ BACK BY AND VERIFIED WITH:     C WHITE,RN 05/20/12 1403 BY K SCHULTZ     Labs: Basic Metabolic Panel:  Recent Labs Lab 05/20/12 0503 05/21/12 0520 05/23/12 0805  NA 135 137  --   K 3.9 3.8 3.5  CL 101 100  --  CO2 22 24  --   GLUCOSE 172* 166*  --   BUN 25* 16  --   CREATININE 1.01 0.95  --   CALCIUM 9.2 9.3  --   MG  --   --  1.5   Liver Function Tests: No results found for this basename: AST, ALT, ALKPHOS, BILITOT, PROT, ALBUMIN,  in the last 168 hours No results found for this basename: LIPASE,  AMYLASE,  in the last 168 hours No results found for this basename: AMMONIA,  in the last 168 hours CBC:  Recent Labs Lab 05/21/12 0520 05/21/12 1752 05/22/12 0500 05/22/12 1640 05/23/12 0647  WBC 10.0 10.2 9.6 9.3 9.3  HGB 10.4* 10.0* 10.3* 10.3* 9.8*  HCT 29.5* 29.0* 29.1* 29.5* 28.1*  MCV 82.4 83.3 82.7 82.6 82.9  PLT 312 329 310 340 312   Cardiac Enzymes:  Recent Labs Lab 05/22/12 0610 05/22/12 1030 05/22/12 1640 05/22/12 2245 05/23/12 0647  TROPONINI 6.07* 3.23* 3.53* 3.68* 2.57*   BNP: BNP (last 3 results)  Recent Labs  02/27/12 1811 05/20/12 0503  PROBNP 1627.0* 1481.0*   CBG:  Recent Labs Lab 05/20/12 1437  GLUCAP 128*       Signed:  Jahnia Hewes  [facility] 05/23/2012, 9:07 AM

## 2012-05-23 NOTE — Progress Notes (Signed)
SUBJECTIVE:  She feels so much better.  No distress and ready to go home.    PHYSICAL EXAM Filed Vitals:   05/22/12 1200 05/22/12 1423 05/22/12 2100 05/23/12 0557  BP: 95/49 130/68 141/58 119/46  Pulse: 56 93 82 60  Temp:  98.9 F (37.2 C) 98.1 F (36.7 C) 97.3 F (36.3 C)  TempSrc:  Oral Oral Oral  Resp: 26 20 20 18   Height:      Weight:      SpO2: 98% 95% 97% 100%   General:  No distress Lungs:  Clear Heart:  RRR Abdomen:  Positive bowel sounds, no rebound no guarding Extremities:  No edema.  LABS: Lab Results  Component Value Date   TROPONINI 2.57* 05/23/2012   Results for orders placed during the hospital encounter of 05/20/12 (from the past 24 hour(s))  TROPONIN I     Status: Abnormal   Collection Time    05/22/12 10:30 AM      Result Value Range   Troponin I 3.23 (*) <0.30 ng/mL  CBC     Status: Abnormal   Collection Time    05/22/12  4:40 PM      Result Value Range   WBC 9.3  4.0 - 10.5 K/uL   RBC 3.57 (*) 3.87 - 5.11 MIL/uL   Hemoglobin 10.3 (*) 12.0 - 15.0 g/dL   HCT 16.1 (*) 09.6 - 04.5 %   MCV 82.6  78.0 - 100.0 fL   MCH 28.9  26.0 - 34.0 pg   MCHC 34.9  30.0 - 36.0 g/dL   RDW 40.9  81.1 - 91.4 %   Platelets 340  150 - 400 K/uL  TROPONIN I     Status: Abnormal   Collection Time    05/22/12  4:40 PM      Result Value Range   Troponin I 3.53 (*) <0.30 ng/mL  TROPONIN I     Status: Abnormal   Collection Time    05/22/12 10:45 PM      Result Value Range   Troponin I 3.68 (*) <0.30 ng/mL  CBC     Status: Abnormal   Collection Time    05/23/12  6:47 AM      Result Value Range   WBC 9.3  4.0 - 10.5 K/uL   RBC 3.39 (*) 3.87 - 5.11 MIL/uL   Hemoglobin 9.8 (*) 12.0 - 15.0 g/dL   HCT 78.2 (*) 95.6 - 21.3 %   MCV 82.9  78.0 - 100.0 fL   MCH 28.9  26.0 - 34.0 pg   MCHC 34.9  30.0 - 36.0 g/dL   RDW 08.6  57.8 - 46.9 %   Platelets 312  150 - 400 K/uL  TROPONIN I     Status: Abnormal   Collection Time    05/23/12  6:47 AM      Result Value Range    Troponin I 2.57 (*) <0.30 ng/mL  POTASSIUM     Status: None   Collection Time    05/23/12  8:05 AM      Result Value Range   Potassium 3.5  3.5 - 5.1 mEq/L  MAGNESIUM     Status: None   Collection Time    05/23/12  8:05 AM      Result Value Range   Magnesium 1.5  1.5 - 2.5 mg/dL    Intake/Output Summary (Last 24 hours) at 05/23/12 0841 Last data filed at 05/22/12 1731  Gross per 24 hour  Intake  560 ml  Output      0 ml  Net    560 ml    ASSESSMENT AND PLAN:  CAD:  Medical management only.  Troponin trending down.  Restart ASA per GI report.  For now I will hold off on Plavix.  Can continue NTG patch.  Holding Ranexa for now.  OK to discharge from my standpoint.  We will put her follow up appt in the system.    Fayrene Fearing [facility] 05/23/2012 8:41 AM

## 2012-05-24 LAB — TYPE AND SCREEN
ABO/RH(D): O POS
Antibody Screen: NEGATIVE
Unit division: 0

## 2012-05-26 ENCOUNTER — Encounter: Payer: Self-pay | Admitting: Gastroenterology

## 2012-05-31 ENCOUNTER — Other Ambulatory Visit: Payer: Self-pay | Admitting: Internal Medicine

## 2012-05-31 ENCOUNTER — Other Ambulatory Visit: Payer: Self-pay | Admitting: Geriatric Medicine

## 2012-05-31 MED ORDER — SUCRALFATE 1 GM/10ML PO SUSP: 1.0000 g | Freq: Four times a day (QID) | ORAL | Status: DC

## 2012-06-07 ENCOUNTER — Encounter: Payer: Medicare Other | Admitting: Physician Assistant

## 2012-06-16 ENCOUNTER — Ambulatory Visit: Payer: Medicare Other | Admitting: Cardiology

## 2012-06-16 ENCOUNTER — Other Ambulatory Visit: Payer: Self-pay

## 2012-06-21 ENCOUNTER — Ambulatory Visit: Payer: Self-pay | Admitting: Internal Medicine

## 2012-06-21 ENCOUNTER — Other Ambulatory Visit: Payer: Self-pay | Admitting: Nurse Practitioner

## 2012-06-29 ENCOUNTER — Other Ambulatory Visit: Payer: Self-pay | Admitting: Nurse Practitioner

## 2012-07-04 ENCOUNTER — Encounter: Payer: Self-pay | Admitting: *Deleted

## 2012-07-04 ENCOUNTER — Other Ambulatory Visit: Payer: Medicare Other

## 2012-07-04 DIAGNOSIS — R609 Edema, unspecified: Secondary | ICD-10-CM

## 2012-07-04 DIAGNOSIS — I2510 Atherosclerotic heart disease of native coronary artery without angina pectoris: Secondary | ICD-10-CM

## 2012-07-04 DIAGNOSIS — E1365 Other specified diabetes mellitus with hyperglycemia: Secondary | ICD-10-CM

## 2012-07-04 DIAGNOSIS — I10 Essential (primary) hypertension: Secondary | ICD-10-CM

## 2012-07-05 ENCOUNTER — Encounter: Payer: Self-pay | Admitting: Internal Medicine

## 2012-07-05 ENCOUNTER — Ambulatory Visit (INDEPENDENT_AMBULATORY_CARE_PROVIDER_SITE_OTHER): Payer: Medicare Other | Admitting: Internal Medicine

## 2012-07-05 VITALS — BP 120/62 | HR 64 | Temp 98.1°F | Resp 14 | Ht 62.0 in | Wt 133.8 lb

## 2012-07-05 DIAGNOSIS — K5732 Diverticulitis of large intestine without perforation or abscess without bleeding: Secondary | ICD-10-CM

## 2012-07-05 DIAGNOSIS — K5720 Diverticulitis of large intestine with perforation and abscess without bleeding: Secondary | ICD-10-CM

## 2012-07-05 DIAGNOSIS — K254 Chronic or unspecified gastric ulcer with hemorrhage: Secondary | ICD-10-CM

## 2012-07-05 DIAGNOSIS — L97509 Non-pressure chronic ulcer of other part of unspecified foot with unspecified severity: Secondary | ICD-10-CM

## 2012-07-05 DIAGNOSIS — M7989 Other specified soft tissue disorders: Secondary | ICD-10-CM

## 2012-07-05 DIAGNOSIS — L97519 Non-pressure chronic ulcer of other part of right foot with unspecified severity: Secondary | ICD-10-CM

## 2012-07-05 DIAGNOSIS — I10 Essential (primary) hypertension: Secondary | ICD-10-CM

## 2012-07-05 DIAGNOSIS — E119 Type 2 diabetes mellitus without complications: Secondary | ICD-10-CM

## 2012-07-05 LAB — BASIC METABOLIC PANEL
BUN/Creatinine Ratio: 20 (ref 11–26)
BUN: 22 mg/dL (ref 8–27)
GFR calc Af Amer: 54 mL/min/{1.73_m2} — ABNORMAL LOW (ref >59)
GFR calc non Af Amer: 47 mL/min/{1.73_m2} — ABNORMAL LOW (ref >59)
Glucose: 147 mg/dL — ABNORMAL HIGH (ref 65–99)

## 2012-07-05 MED ORDER — FUROSEMIDE 20 MG PO TABS: 20.0000 mg | ORAL_TABLET | Freq: Every day | ORAL | Status: DC

## 2012-07-05 MED ORDER — DOXYCYCLINE HYCLATE 50 MG PO CAPS: 100.0000 mg | ORAL_CAPSULE | Freq: Two times a day (BID) | ORAL | Status: DC

## 2012-07-05 MED ORDER — DOXYCYCLINE HYCLATE 50 MG PO CAPS: 50.0000 mg | ORAL_CAPSULE | Freq: Two times a day (BID) | ORAL | Status: DC

## 2012-07-05 MED ORDER — CLINDAMYCIN HCL 300 MG PO CAPS: 600.0000 mg | ORAL_CAPSULE | Freq: Three times a day (TID) | ORAL | Status: AC

## 2012-07-05 MED ORDER — SACCHAROMYCES BOULARDII 250 MG PO CAPS: 250.0000 mg | ORAL_CAPSULE | Freq: Two times a day (BID) | ORAL | Status: DC

## 2012-07-05 NOTE — Progress Notes (Signed)
Subjective:    Patient ID: Julia Greene, female    DOB: 01/19/1931, 79 y.o.   MRN: [id number]  Allergies  Allergen Reactions  . Demerol Other (See Comments)    coma  . Codeine Swelling and Rash    Injectable. Can take by mouth.  . Benzoin     coma  . Carafate (Sucralfate)   . Cephalexin     Turns red  . Cephalosporins     coma  . Contrast Media (Iodinated Diagnostic Agents)     Passed out  . Dilaudid (Hydromorphone Hcl)     coma  . Insulins Swelling  . Keflex (Cephalexin)   . Lisinopril     cough  . Morphine And Related     coma  . Other     Patient highly sensitive to antibiotics, narcotics and any medication used to put patient to sleep for surgery.  . Penicillins     coma  . Sulfa Antibiotics     coma  . Valium (Diazepam)     cx pt to "climb walls"  . Vistaril (Hydroxyzine Hcl)   . Aspirin     coma  . Latex Rash    HPI  79 y/o female patient is here for routine follow up with her daughter in law. She had recent hospital admission with bleeding from her colostomy site and it was found to be a diverticular bleed. She was put on protonix 40 mg bid and carfate but has not taken carfate for almost a month now as she developed nausea and vomiting from it. No further use of NSAIDs and no bleed reported. She has nitced throbbing pain in her right little toe and has started having drainage.  Of note she has severe 3 vessel heart disease not amenable to PCI or CABG and severe PAD with 5th toe ulcer on right foot and is followed by cardiology and vascular.   Review of Systems  Constitutional: Negative for fever, chills and appetite change.  HENT: Negative for congestion.   Respiratory: Negative for cough, chest tightness and shortness of breath.   Cardiovascular: Negative for chest pain, palpitations and leg swelling.  Gastrointestinal: Negative for nausea, vomiting, abdominal pain and blood in stool.       S/p colostomy  Musculoskeletal: Negative for back pain.  Skin:  Positive for wound. Negative for rash.  Neurological: Negative for dizziness and light-headedness.  Hematological: Negative for adenopathy.  Psychiatric/Behavioral: Negative for behavioral problems, sleep disturbance and agitation.       Objective:   Physical Exam  gen- elderly, frail female patient in NAD heent- no pallor or icterus, no LAD, mmm cvs- n s1, s2, rrr respi- b/l CTA abdo- bowel sound present, colostomy bag site clean Ext-Left small toe has open sore with purulent drainage. Erythema present and tenderness on exam. Poor dorsalis pedis Neuro-aao x 3, no focal deficit  BP 120/62  Pulse 64  Temp(Src) 98.1 F (36.7 C) (Oral)  Resp 14  Ht 5\' 2"  (1.575 m)  Wt 133 lb 12.8 oz (60.691 kg)  BMI 24.47 kg/m2     Assessment & Plan:   Foot ulcer, right  - Secondary to severe PAD. Follows with Dr Darrick Penna. amputation of the 5th toe has been discussed with patient for worsening pain. On tylenol# 3 for pain at present. Concerns for infection of the ulcer with current finding. Will send wound culture. Start doxycycline 100 mg bid for 2 weeks and clindamycin 600 mg tid for 10 days and florastor  250 mg bid for 1 month. MRI left foot to rule out osteomyelitis  CAD- with severe 3 vessel disease not amenable for CABG /PCI and had recent NSTEMI. On baby ASA and off plavix now. Continue lopressor for now. Not on statin as she cannot tolerate it. Continue prn NTG. follow up with Dr Antoine Poche. Currently given her edema, will start her on lasix 20 mg daily for now  Upper gi bleed- had diverticular bleed and required blood trnasfusion. hemdynamically stable. Check cbc today. Continue ppi. Off carfate due to side effects. No further bleed. To follow with GI. No NSAIDS  HTN (hypertension) -stable on metoprolol 25 mg bid  Edema- start lasix 20 mg daily for now and monitor bmp  Diverticulitis of colon with perforation s/p colectomy/ostomy Oct2012 -Has colostomy with issues of constipation for past  few weeks and bleeding from the stoma on 2 occasions. normal output     Diabetes mellitus  -continue metformin  Hypothyroidism- continue synthroid

## 2012-07-06 DIAGNOSIS — E119 Type 2 diabetes mellitus without complications: Secondary | ICD-10-CM | POA: Insufficient documentation

## 2012-07-06 LAB — CBC WITH DIFFERENTIAL/PLATELET
Basos: 1 % (ref 0–3)
Eos: 2 % (ref 0–5)
HCT: 30.8 % — ABNORMAL LOW (ref 34.0–46.6)
Lymphocytes Absolute: 3 10*3/uL (ref 0.7–3.1)
MCV: 83 fL (ref 79–97)
Monocytes Absolute: 0.8 10*3/uL (ref 0.1–0.9)
Neutrophils Absolute: 3.9 10*3/uL (ref 1.4–7.0)
RBC: 3.71 x10E6/uL — ABNORMAL LOW (ref 3.77–5.28)
WBC: 8 10*3/uL (ref 3.4–10.8)

## 2012-07-10 LAB — ANAEROBIC AND AEROBIC CULTURE

## 2012-07-13 ENCOUNTER — Other Ambulatory Visit: Payer: Self-pay | Admitting: Internal Medicine

## 2012-07-14 ENCOUNTER — Ambulatory Visit
Admission: RE | Admit: 2012-07-14 | Discharge: 2012-07-14 | Disposition: A | Discharge status: Home / Self Care | Payer: Medicare Other | Source: Ambulatory Visit | Attending: Internal Medicine | Admitting: Internal Medicine

## 2012-07-14 DIAGNOSIS — L97519 Non-pressure chronic ulcer of other part of right foot with unspecified severity: Secondary | ICD-10-CM

## 2012-07-14 MED ORDER — GADOBENATE DIMEGLUMINE 529 MG/ML IV SOLN
10.0000 mL | Freq: Once | INTRAVENOUS | Status: AC | PRN
  Administered 2012-07-14: 10 mL via INTRAVENOUS

## 2012-07-18 ENCOUNTER — Telehealth: Payer: Self-pay | Admitting: Vascular Surgery

## 2012-07-18 NOTE — Telephone Encounter (Signed)
Spoke with Dr Glade Lloyd.  Pt now has osteomyelitis in 5 th toe.  No revascularization options and pt reluctant to undergo amputation of toe.  Dr Glade Lloyd is going to try 6 weeks of antibiotics if wound backslides or worsens will again need to consider amputation of toe and if it does not heal potentially BKA.  Fabienne Bruns, MD [facility] [place] Office: [phone] Pager: [phone]

## 2012-07-19 ENCOUNTER — Encounter: Payer: Self-pay | Admitting: Internal Medicine

## 2012-07-19 ENCOUNTER — Ambulatory Visit (INDEPENDENT_AMBULATORY_CARE_PROVIDER_SITE_OTHER): Payer: Medicare Other | Admitting: Internal Medicine

## 2012-07-19 VITALS — BP 130/62 | HR 60 | Temp 97.5°F | Resp 16 | Ht 62.0 in | Wt 131.0 lb

## 2012-07-19 DIAGNOSIS — E119 Type 2 diabetes mellitus without complications: Secondary | ICD-10-CM

## 2012-07-19 DIAGNOSIS — I739 Peripheral vascular disease, unspecified: Secondary | ICD-10-CM

## 2012-07-19 DIAGNOSIS — M869 Osteomyelitis, unspecified: Secondary | ICD-10-CM

## 2012-07-20 NOTE — Progress Notes (Signed)
  Subjective:    Patient ID: Julia Greene, female    DOB: [birth year], 79 y.o.   MRN: [id number]  CC- follow up on toe wound  HPI 79 y/o female patient is here for follow up with her daughter in law. She was started on doxycyline and clindamycin 2 weeks back for presumed osteomyelitis of right 5th toe. Her wound culture grew MRSA sensitive to doxycycline. Her clindamycin was stopped. Her MRI of right foot was suggestive of osteomyelitis of 5th toe. She has been started on doxycyline 100 mg q12h for further 4 more weeks now. The drainage from her toe has decreased and pain subsided. She denies any complaint this visit. She is tolerating doxycyline well. Denies any loose stool in her colostomy bag.  Of note  --she has severe 3 vessel heart disease not amenable to PCI or CABG and severe PAD with 5th toe ulcer on right foot and is followed by cardiology and vascular.  -- I spoke over the phone with Dr Darrick Penna and reviewed on the plan of trying antibiotics for 4 more weeks and potential amputation of toe if no improvement   Review of Systems Constitutional: Negative for fever, chills and appetite change.  HENT: Negative for congestion.   Respiratory: Negative for cough, chest tightness and shortness of breath.   Cardiovascular: Negative for chest pain, palpitations and leg swelling.  Gastrointestinal: Negative for nausea, vomiting, abdominal pain and blood in stool.        S/p colostomy  Musculoskeletal: Negative for back pain.  Skin: Positive for wound. Negative for rash.  Neurological: Negative for dizziness and light-headedness.  Hematological: Negative for adenopathy.  Psychiatric/Behavioral: Negative for behavioral problems, sleep disturbance and agitation.     Objective:   Physical Exam  BP 130/62  Pulse 60  Temp(Src) 97.5 F (36.4 C)  Resp 16  Ht 5\' 2"  (1.575 m)  Wt 131 lb (59.421 kg)  BMI 23.95 kg/m2  gen- elderly, frail female patient in NAD heent- no pallor or icterus, no  LAD, mmm cvs- n s1, s2, rrr respi- b/l CTA abdo- bowel sound present, colostomy bag site clean Ext-Left small toe has open sore with minimal serous drainage. Erythema present and tenderness on exam but improved from before. Poor dorsalis pedis. Neuro-aao x 3, no focal deficit  Labs and mRI reviewed    Assessment & Plan:   Right 5th toe osteomyelitis- with wound culutre growing MRSA sensitive to doxycyline. Will complete total 6 week course of this. Wound care to be continued as advised. Has severe PAD which can interfere with wound healing. If pt worsens clinically or develops complications, will consider potential amputation. Talked about this with Dr Darrick Penna from vascular, patient and her daughter in law. Answered questions from patient and daughter in law.continue florastor and encouraged eating yogurt to help maintain gut flora    Diabetes mellitus  -continue metformin  PAD- monitor clinicaly, bp under control. Continue current medication regimen

## 2012-08-01 ENCOUNTER — Encounter: Payer: Self-pay | Admitting: Cardiology

## 2012-08-01 ENCOUNTER — Ambulatory Visit (INDEPENDENT_AMBULATORY_CARE_PROVIDER_SITE_OTHER): Payer: Medicare Other | Admitting: Cardiology

## 2012-08-01 ENCOUNTER — Other Ambulatory Visit: Payer: Self-pay | Admitting: Internal Medicine

## 2012-08-01 VITALS — BP 110/54 | HR 59 | Ht 62.0 in | Wt 128.4 lb

## 2012-08-01 DIAGNOSIS — I70219 Atherosclerosis of native arteries of extremities with intermittent claudication, unspecified extremity: Secondary | ICD-10-CM

## 2012-08-01 NOTE — Patient Instructions (Addendum)
The current medical regimen is effective;  continue present plan and medications.  Follow up in 6 months with Dr Hochrein.  You will receive a letter in the mail 2 months before you are due.  Please call us when you receive this letter to schedule your follow up appointment.  

## 2012-08-01 NOTE — Progress Notes (Signed)
HPI The patient returns for followup of CAD and carotid stenosis.  She was last hospitalized in April with a non-Q-wave myocardial infarction and was managed medically. She did have an upper GI bleed. This was felt to be related to the Aleve that she was taking. She was transfused. EGD demonstrated multiple gastric ulcers. She was told to avoid nonsteroidals.  Since that visit she has had take nitroglycerin occasionally but not routinely. She thinks the nitroglycerin patch which she has been on has helped. She does have a nonhealing ulcer on her right small toe. This is MRSA. She's having this followed and managed.  Allergies  Allergen Reactions  . Demerol Other (See Comments)    coma  . Codeine Swelling and Rash    Injectable. Can take by mouth.  . Benzoin     coma  . Carafate (Sucralfate)   . Cephalexin     Turns red  . Cephalosporins     coma  . Contrast Media (Iodinated Diagnostic Agents)     Passed out  . Dilaudid (Hydromorphone Hcl)     coma  . Insulins Swelling  . Keflex (Cephalexin)   . Lisinopril     cough  . Morphine And Related     coma  . Other     Patient highly sensitive to antibiotics, narcotics and any medication used to put patient to sleep for surgery.  . Penicillins     coma  . Sulfa Antibiotics     coma  . Valium (Diazepam)     cx pt to "climb walls"  . Vistaril (Hydroxyzine Hcl)   . Aspirin     coma  . Latex Rash    Current Outpatient Prescriptions  Medication Sig Dispense Refill  . acetaminophen-codeine (TYLENOL #3) 300-30 MG per tablet TAKE ONE TO TWO TABLETS BY MOUTH EVERY 4 HOURS AS NEEDED  180 tablet  0  . aspirin EC 81 MG tablet Take 81 mg by mouth daily.       . Calcium Carbonate-Vitamin D (CALCIUM 600 + D PO) Take 1 tablet by mouth 2 (two) times daily.      . DiphenhydrAMINE HCl (BENADRYL PO) Take 30 mLs by mouth as needed. For cough      . doxycycline (VIBRAMYCIN) 50 MG capsule Take 2 capsules (100 mg total) by mouth 2 (two) times daily.   28 capsule  0  . ezetimibe (ZETIA) 10 MG tablet Take 1 tablet (10 mg total) by mouth daily.  30 tablet  11  . furosemide (LASIX) 20 MG tablet Take 1 tablet (20 mg total) by mouth daily.  30 tablet  3  . levothyroxine (SYNTHROID, LEVOTHROID) 25 MCG tablet Take 25 mcg by mouth daily.      . Melatonin 5 MG CAPS Take 5 mg by mouth at bedtime as needed. To help sleep      . metoprolol (LOPRESSOR) 50 MG tablet Take 0.5 tablets (25 mg total) by mouth 2 (two) times daily.  60 tablet  6  . Multiple Vitamins-Minerals (ADULT GUMMY PO) Take 2 tablets by mouth daily.      . nitroGLYCERIN (NITRODUR - DOSED IN MG/24 HR) 0.4 mg/hr Place 1 patch (0.4 mg total) onto the skin daily.  30 patch  6  . NITROSTAT 0.4 MG SL tablet PLACE 1 TABLET (0.4 TOTAL) UNDER THE TONGUE EVERY 5 (FIVE) MINUTES X 3 DOSES AS NEEDED FOR CHEST PAIN  25 tablet  1  . pantoprazole (PROTONIX) 40 MG tablet TAKE ONE  TABLET BY MOUTH ONCE DAILY  60 tablet  0  . saccharomyces boulardii (FLORASTOR) 250 MG capsule Take 1 capsule (250 mg total) by mouth 2 (two) times daily.  60 capsule  0  . [DISCONTINUED] amiodarone (PACERONE) 200 MG tablet Take 200 mg by mouth daily.       No current facility-administered medications for this visit.    Past Medical History  Diagnosis Date  . Coronary artery disease     a. h/o NSTEMI 2012 in setting of AFib/perf'd divertic (in [place]. - coded, CPR, intubation during hospital stay);  b.  MV 2/13: EF 57%, mod isch septum and poss small part of the Ant wall;  c.  LHC 04/06/11: diffuse 3v CAD no high grade lesions amenable to PCI, surgery would be incomplete thus med rx recommended. d. NSTEMI 02/2012 managed medically.  . Atrial fibrillation     in setting of sepsis in [place]; tx with amiodarone (amio stopped 04/20/11);    Marland Kitchen Injury to liver   . Allergic rhinitis due to pollen   . Hyperlipidemia   . Hypertension   . Vitamin D deficiency   . Anemia, unspecified   . Diabetes mellitus     borderline  . Diverticulitis of  colon with perforation s/p colectomy/ostomy 13Oct2012 02/23/2011    s/p colostomy  . Carotid stenosis     a.  02/2011 - RICA 80-99% and LICA 60-79%;  b. s/p R CEA Dr. Darrick Penna 09/2011  . LV dysfunction     a.  Echo 10/2010 (in setting of NSTEMI at [facility] in [facility]):  mid ot apical septal AK, EF 40-45%, MAC, mild to mod MR, mild TR, no pulmo HTN;  b. Echo 02/2012: EF 45-50% with WMA, mild-mod MR.  Marland Kitchen PAD (peripheral artery disease)     a. ABIs 9/13: R 0.55; L 0.93;  b. arterial insufficiency ulcers on right foot;  followed by [facility];  c. angiogram 01/2012 with severe bilat SFA and pop disease => not good bypass candidate and PTA not an option => prob continue conservative mgmt  . Hypothyroid   . Fall at home Feb. 6, 2014    Left leg   . Complication of anesthesia     Allergie to narcotics and abx.  . Family history of anesthesia complication     "mother had allergies too" (05/20/2012)  . Anginal pain   . Pneumonia     "a few times; always associated w/surgeries" (05/20/2012)  . GERD (gastroesophageal reflux disease)   . Gastric ulcer     "just found these today" (05/20/2012)  . Arthritis     "legs, arm, bones; Chelation therapy cleared all that" (05/20/2012)  . History of blood transfusion     "many" (05/20/2012)  . Toe ulcer, right     5th toe; due to PVD/notes 05/20/2012  . Myocardial infarction ~ 1952; Feb. 1, 2014    "I've had a total of 8 as of 02/2012" (05/20/2012)  . NSTEMI (non-ST elevated myocardial infarction) 05/20/2012    [name] 05/20/2012  . Colostomy in place     Past Surgical History  Procedure Laterality Date  . Total hip arthroplasty Right 07/2004  . Colostomy  10/2010    for complex diverticular disease.  not done in [place]  . Cesarean section  1955, 1959, 1960  . Abdominal adhesion surgery  1976    at time of hysterectomy  . Endarterectomy  10/20/2011    Procedure: ENDARTERECTOMY CAROTID;  Surgeon: Sherren Kerns, MD;  Location:  [facility] OR;  Service: Vascular;  Laterality:  Right;  Right Carotid Endarterectomy with patch angioplasty  . Joint replacement  2006    Right  Hip  . Appendectomy  1943  . Abdominal hysterectomy  1976  . Combined hysterectomy abdominal w/ a&p repair / oophorectomy  1976    "left a piece of one ovary" 94/25/2014)  . Cardiac catheterization    . Colostomy  10/2010  . Cataract extraction w/ intraocular lens  implant, bilateral Bilateral ~ 2013  . Dilation and curettage of uterus  1950's    "3-4 during childbirth time" (05/20/2012)  . Colectomy  10/2009    sigmoid/notes 05/20/2012  . Esophagogastroduodenoscopy N/A 05/20/2012    Procedure: ESOPHAGOGASTRODUODENOSCOPY (EGD);  Surgeon: Louis Meckel, MD;  Location[facility] ENDOSCOPY;  Service: Endoscopy;  Laterality: N/A;    ROS:  As stated in the HPI and negative for all other systems.  PHYSICAL EXAM BP 110/54  Pulse 59  Ht 5\' 2"  (1.575 m)  Wt 128 lb 6.4 oz (58.242 kg)  BMI 23.48 kg/m2  SpO2 94% GENERAL:  No distress NECK:  No jugular venous distention, waveform within normal limits, carotid upstroke brisk and symmetric, bilateral bruits right greater than left, no thyromegaly LUNGS:  Clear to auscultation bilaterally CHEST:  Unremarkable HEART:  PMI not displaced or sustained,S1 and S2 within normal limits, no S3, no S4, no clicks, no rubs, apical holosystolic murmur 3/6 with radiation to the axilla and mildly out the outflow tract. ABD:  Flat, positive bowel sounds normal in frequency in pitch, no bruits, no rebound, no guarding, no midline pulsatile mass, no hepatomegaly, no splenomegaly, colostomy left lower quadrent EXT:  2 plus pulses upper and decreased DP/PT lower, no edema, no cyanosis no clubbing, non healing right foot ulcer    ASSESSMENT AND PLAN  Unstable angina -  She has no new symptoms.  She will continue the meds as listed.   Carotid artery stenosis, asymptomatic -  She has follow up with Dr. Darrick Penna  Atrial fibrillation -  She has had no symptomatic recurrence  of this. No change in therapy is indicated.

## 2012-08-05 ENCOUNTER — Other Ambulatory Visit: Payer: Self-pay | Admitting: Nurse Practitioner

## 2012-08-08 ENCOUNTER — Other Ambulatory Visit: Payer: Self-pay | Admitting: Geriatric Medicine

## 2012-08-15 ENCOUNTER — Other Ambulatory Visit: Payer: Self-pay | Admitting: *Deleted

## 2012-08-15 MED ORDER — NITROGLYCERIN 0.4 MG SL SUBL: SUBLINGUAL_TABLET | SUBLINGUAL | Status: DC

## 2012-08-26 ENCOUNTER — Other Ambulatory Visit: Payer: Self-pay

## 2012-08-26 ENCOUNTER — Encounter (HOSPITAL_COMMUNITY): Payer: Self-pay | Admitting: Emergency Medicine

## 2012-08-26 ENCOUNTER — Emergency Department (HOSPITAL_COMMUNITY): Payer: Medicare Other

## 2012-08-26 ENCOUNTER — Inpatient Hospital Stay (HOSPITAL_COMMUNITY)
Admission: EM | Admit: 2012-08-26 | Discharge: 2012-08-29 | DRG: 281 | Disposition: A | Discharge status: Home / Self Care | Payer: Medicare Other | Attending: Internal Medicine | Admitting: Internal Medicine

## 2012-08-26 DIAGNOSIS — Z9049 Acquired absence of other specified parts of digestive tract: Secondary | ICD-10-CM

## 2012-08-26 DIAGNOSIS — L97519 Non-pressure chronic ulcer of other part of right foot with unspecified severity: Secondary | ICD-10-CM

## 2012-08-26 DIAGNOSIS — I6529 Occlusion and stenosis of unspecified carotid artery: Secondary | ICD-10-CM | POA: Diagnosis present

## 2012-08-26 DIAGNOSIS — M869 Osteomyelitis, unspecified: Secondary | ICD-10-CM

## 2012-08-26 DIAGNOSIS — Z79899 Other long term (current) drug therapy: Secondary | ICD-10-CM

## 2012-08-26 DIAGNOSIS — E119 Type 2 diabetes mellitus without complications: Secondary | ICD-10-CM | POA: Diagnosis present

## 2012-08-26 DIAGNOSIS — Z888 Allergy status to other drugs, medicaments and biological substances status: Secondary | ICD-10-CM

## 2012-08-26 DIAGNOSIS — K222 Esophageal obstruction: Secondary | ICD-10-CM

## 2012-08-26 DIAGNOSIS — I219 Acute myocardial infarction, unspecified: Secondary | ICD-10-CM

## 2012-08-26 DIAGNOSIS — I200 Unstable angina: Secondary | ICD-10-CM

## 2012-08-26 DIAGNOSIS — I340 Nonrheumatic mitral (valve) insufficiency: Secondary | ICD-10-CM

## 2012-08-26 DIAGNOSIS — Z48812 Encounter for surgical aftercare following surgery on the circulatory system: Secondary | ICD-10-CM

## 2012-08-26 DIAGNOSIS — K259 Gastric ulcer, unspecified as acute or chronic, without hemorrhage or perforation: Secondary | ICD-10-CM | POA: Diagnosis present

## 2012-08-26 DIAGNOSIS — I4949 Other premature depolarization: Secondary | ICD-10-CM | POA: Diagnosis present

## 2012-08-26 DIAGNOSIS — I214 Non-ST elevation (NSTEMI) myocardial infarction: Principal | ICD-10-CM

## 2012-08-26 DIAGNOSIS — I2510 Atherosclerotic heart disease of native coronary artery without angina pectoris: Secondary | ICD-10-CM

## 2012-08-26 DIAGNOSIS — E559 Vitamin D deficiency, unspecified: Secondary | ICD-10-CM | POA: Diagnosis present

## 2012-08-26 DIAGNOSIS — M79609 Pain in unspecified limb: Secondary | ICD-10-CM

## 2012-08-26 DIAGNOSIS — R079 Chest pain, unspecified: Secondary | ICD-10-CM

## 2012-08-26 DIAGNOSIS — N289 Disorder of kidney and ureter, unspecified: Secondary | ICD-10-CM | POA: Diagnosis present

## 2012-08-26 DIAGNOSIS — K922 Gastrointestinal hemorrhage, unspecified: Secondary | ICD-10-CM

## 2012-08-26 DIAGNOSIS — I10 Essential (primary) hypertension: Secondary | ICD-10-CM

## 2012-08-26 DIAGNOSIS — I252 Old myocardial infarction: Secondary | ICD-10-CM

## 2012-08-26 DIAGNOSIS — K219 Gastro-esophageal reflux disease without esophagitis: Secondary | ICD-10-CM | POA: Diagnosis present

## 2012-08-26 DIAGNOSIS — R0989 Other specified symptoms and signs involving the circulatory and respiratory systems: Secondary | ICD-10-CM

## 2012-08-26 DIAGNOSIS — I959 Hypotension, unspecified: Secondary | ICD-10-CM

## 2012-08-26 DIAGNOSIS — I428 Other cardiomyopathies: Secondary | ICD-10-CM | POA: Diagnosis present

## 2012-08-26 DIAGNOSIS — E039 Hypothyroidism, unspecified: Secondary | ICD-10-CM | POA: Diagnosis present

## 2012-08-26 DIAGNOSIS — I70219 Atherosclerosis of native arteries of extremities with intermittent claudication, unspecified extremity: Secondary | ICD-10-CM

## 2012-08-26 DIAGNOSIS — Z933 Colostomy status: Secondary | ICD-10-CM

## 2012-08-26 DIAGNOSIS — I429 Cardiomyopathy, unspecified: Secondary | ICD-10-CM

## 2012-08-26 DIAGNOSIS — R011 Cardiac murmur, unspecified: Secondary | ICD-10-CM

## 2012-08-26 DIAGNOSIS — E785 Hyperlipidemia, unspecified: Secondary | ICD-10-CM

## 2012-08-26 DIAGNOSIS — I739 Peripheral vascular disease, unspecified: Secondary | ICD-10-CM

## 2012-08-26 DIAGNOSIS — K5720 Diverticulitis of large intestine with perforation and abscess without bleeding: Secondary | ICD-10-CM

## 2012-08-26 DIAGNOSIS — Z96649 Presence of unspecified artificial hip joint: Secondary | ICD-10-CM

## 2012-08-26 DIAGNOSIS — I4891 Unspecified atrial fibrillation: Secondary | ICD-10-CM

## 2012-08-26 DIAGNOSIS — E1169 Type 2 diabetes mellitus with other specified complication: Secondary | ICD-10-CM | POA: Diagnosis present

## 2012-08-26 DIAGNOSIS — D649 Anemia, unspecified: Secondary | ICD-10-CM | POA: Diagnosis present

## 2012-08-26 DIAGNOSIS — Z880 Allergy status to penicillin: Secondary | ICD-10-CM

## 2012-08-26 DIAGNOSIS — M7989 Other specified soft tissue disorders: Secondary | ICD-10-CM

## 2012-08-26 HISTORY — DX: Chest pain, unspecified: R07.9

## 2012-08-26 LAB — CBC WITH DIFFERENTIAL/PLATELET
Basophils Relative: 1 % (ref 0–1)
Eosinophils Absolute: 0.4 10*3/uL (ref 0.0–0.7)
HCT: 28.4 % — ABNORMAL LOW (ref 36.0–46.0)
Hemoglobin: 9.3 g/dL — ABNORMAL LOW (ref 12.0–15.0)
Lymphs Abs: 2.4 10*3/uL (ref 0.7–4.0)
MCH: 27.3 pg (ref 26.0–34.0)
MCHC: 32.7 g/dL (ref 30.0–36.0)
MCV: 83.3 fL (ref 78.0–100.0)
Monocytes Absolute: 0.7 10*3/uL (ref 0.1–1.0)
Monocytes Relative: 8 % (ref 3–12)
RBC: 3.41 MIL/uL — ABNORMAL LOW (ref 3.87–5.11)

## 2012-08-26 LAB — POCT I-STAT, CHEM 8
BUN: 35 mg/dL — ABNORMAL HIGH (ref 6–23)
Creatinine, Ser: 1.5 mg/dL — ABNORMAL HIGH (ref 0.50–1.10)
Glucose, Bld: 160 mg/dL — ABNORMAL HIGH (ref 70–99)
Hemoglobin: 9.9 g/dL — ABNORMAL LOW (ref 12.0–15.0)
Potassium: 4.3 mEq/L (ref 3.5–5.1)
Sodium: 140 mEq/L (ref 135–145)

## 2012-08-26 LAB — BASIC METABOLIC PANEL
BUN: 37 mg/dL — ABNORMAL HIGH (ref 6–23)
Chloride: 102 mEq/L (ref 96–112)
Creatinine, Ser: 1.33 mg/dL — ABNORMAL HIGH (ref 0.50–1.10)
GFR calc Af Amer: 43 mL/min — ABNORMAL LOW (ref >90)
GFR calc non Af Amer: 37 mL/min — ABNORMAL LOW (ref >90)
Glucose, Bld: 158 mg/dL — ABNORMAL HIGH (ref 70–99)

## 2012-08-26 LAB — CBC
Hemoglobin: 9.7 g/dL — ABNORMAL LOW (ref 12.0–15.0)
RBC: 3.57 MIL/uL — ABNORMAL LOW (ref 3.87–5.11)

## 2012-08-26 LAB — CREATININE, SERUM
Creatinine, Ser: 1.25 mg/dL — ABNORMAL HIGH (ref 0.50–1.10)
GFR calc non Af Amer: 40 mL/min — ABNORMAL LOW (ref >90)

## 2012-08-26 LAB — GLUCOSE, CAPILLARY: Glucose-Capillary: 88 mg/dL (ref 70–99)

## 2012-08-26 LAB — TROPONIN I: Troponin I: 0.87 ng/mL (ref <0.30)

## 2012-08-26 LAB — POCT I-STAT TROPONIN I

## 2012-08-26 MED ORDER — ACETAMINOPHEN 650 MG RE SUPP: 650.0000 mg | Freq: Four times a day (QID) | RECTAL | Status: DC | PRN

## 2012-08-26 MED ORDER — NITROGLYCERIN 0.4 MG SL SUBL: 0.4000 mg | SUBLINGUAL_TABLET | SUBLINGUAL | Status: DC | PRN

## 2012-08-26 MED ORDER — ASPIRIN EC 81 MG PO TBEC
81.0000 mg | DELAYED_RELEASE_TABLET | Freq: Every day | ORAL | Status: DC
  Administered 2012-08-26 – 2012-08-29 (×4): 81 mg via ORAL
  Filled 2012-08-26 (×5): qty 1

## 2012-08-26 MED ORDER — ENOXAPARIN SODIUM 40 MG/0.4ML ~~LOC~~ SOLN
40.0000 mg | SUBCUTANEOUS | Status: DC
  Filled 2012-08-26: qty 0.4

## 2012-08-26 MED ORDER — ACETAMINOPHEN 325 MG PO TABS: 650.0000 mg | ORAL_TABLET | Freq: Four times a day (QID) | ORAL | Status: DC | PRN

## 2012-08-26 MED ORDER — METOPROLOL TARTRATE 25 MG PO TABS
25.0000 mg | ORAL_TABLET | Freq: Two times a day (BID) | ORAL | Status: DC
Start: 2012-08-26 — End: 2012-08-29
  Administered 2012-08-26 – 2012-08-29 (×6): 25 mg via ORAL
  Filled 2012-08-26 (×7): qty 1

## 2012-08-26 MED ORDER — ACETAMINOPHEN-CODEINE #3 300-30 MG PO TABS
1.0000 | ORAL_TABLET | ORAL | Status: DC | PRN
  Administered 2012-08-28 (×2): 1 via ORAL
  Filled 2012-08-26 (×2): qty 1

## 2012-08-26 MED ORDER — SODIUM CHLORIDE 0.9 % IJ SOLN
3.0000 mL | Freq: Two times a day (BID) | INTRAMUSCULAR | Status: DC
  Administered 2012-08-26 – 2012-08-28 (×2): 3 mL via INTRAVENOUS

## 2012-08-26 MED ORDER — ONDANSETRON HCL 4 MG/2ML IJ SOLN: 4.0000 mg | Freq: Four times a day (QID) | INTRAMUSCULAR | Status: DC | PRN

## 2012-08-26 MED ORDER — FUROSEMIDE 20 MG PO TABS
20.0000 mg | ORAL_TABLET | Freq: Every day | ORAL | Status: DC
  Filled 2012-08-26: qty 1

## 2012-08-26 MED ORDER — SACCHAROMYCES BOULARDII 250 MG PO CAPS
250.0000 mg | ORAL_CAPSULE | Freq: Two times a day (BID) | ORAL | Status: DC
  Administered 2012-08-26 – 2012-08-29 (×6): 250 mg via ORAL
  Filled 2012-08-26 (×7): qty 1

## 2012-08-26 MED ORDER — ADULT MULTIVITAMIN W/MINERALS CH
1.0000 | ORAL_TABLET | Freq: Every day | ORAL | Status: DC
  Administered 2012-08-26 – 2012-08-29 (×4): 1 via ORAL
  Filled 2012-08-26 (×4): qty 1

## 2012-08-26 MED ORDER — LEVOTHYROXINE SODIUM 25 MCG PO TABS
25.0000 ug | ORAL_TABLET | Freq: Every day | ORAL | Status: DC
  Administered 2012-08-27 – 2012-08-29 (×3): 25 ug via ORAL
  Filled 2012-08-26 (×4): qty 1

## 2012-08-26 MED ORDER — PANTOPRAZOLE SODIUM 40 MG PO TBEC
40.0000 mg | DELAYED_RELEASE_TABLET | Freq: Every day | ORAL | Status: DC
  Administered 2012-08-26 – 2012-08-29 (×4): 40 mg via ORAL
  Filled 2012-08-26 (×4): qty 1

## 2012-08-26 MED ORDER — ONDANSETRON HCL 4 MG PO TABS: 4.0000 mg | ORAL_TABLET | Freq: Four times a day (QID) | ORAL | Status: DC | PRN

## 2012-08-26 MED ORDER — EZETIMIBE 10 MG PO TABS
10.0000 mg | ORAL_TABLET | Freq: Every day | ORAL | Status: DC
  Administered 2012-08-27 – 2012-08-29 (×3): 10 mg via ORAL
  Filled 2012-08-26 (×3): qty 1

## 2012-08-26 NOTE — H&P (Addendum)
Patient's PCP: Oneal Grout, MD  Chief Complaint: Chest pain  History of Present Illness: Julia Greene is a 79 y.o. Caucasian female with history of coronary artery disease, A. fib in the setting of sepsis resolved not on anticoagulation, hypertension, hyperlipidemia, diabetes, carotid stenosis, LV dysfunction with EF of 40-45%, hypothyroidism, GERD, peripheral artery disease, carotid stenosis, cardiomyopathy, diverticulitis of the colon status post perforation and status post colostomy/ostomy in October of 2012 and non-ST elevation MI who presents with the complaints of chest pain.  Patient has had extensive heart disease, she was recently evaluated by cardiology that 1 month ago before she traveled out of state, she just arrived back to [facility] yesterday.  This morning around 1130 a.m. while she was making breakfast, she had sudden onset of chest pressure that was sharp and severe with radiating down her both arms, with some radiation to her left jaw.  The pain was similar to her previous heart attacks.  She took 3 sublingual nitroglycerin tablets without relief.  She called her son around to 2:20 p.m., given she was still having pain he called EMS.  Patient received few more nitroglycerin on the way to the emergency department, she was chest pain-free by 430 p.m.  Hospitalist service was called to admit the patient for further care and management.  Of note patient's son indicated since 07/15/2012, patient has taken at least 40 tablets of sublingual nitroglycerin.  She denies any recent fevers, chills, nausea, vomiting, shortness of breath, abdominal pain, diarrhea, headaches or vision changes.  She also has recently completed course of antibiotics for the right toe cellulitis yesterday which she took for about 4 weeks.  Review of Systems: All systems reviewed with the patient and positive as per history of present illness, otherwise all other systems are negative.  Past Medical History  Diagnosis  Date  . Coronary artery disease     a. h/o NSTEMI 2012 in setting of AFib/perf'd divertic (in [place]. - coded, CPR, intubation during hospital stay);  b.  MV 2/13: EF 57%, mod isch septum and poss small part of the Ant wall;  c.  LHC 04/06/11: diffuse 3v CAD no high grade lesions amenable to PCI, surgery would be incomplete thus med rx recommended. d. NSTEMI 02/2012 managed medically.  . Atrial fibrillation     in setting of sepsis in [place]; tx with amiodarone (amio stopped 04/20/11);    Marland Kitchen Injury to liver   . Allergic rhinitis due to pollen   . Hyperlipidemia   . Hypertension   . Vitamin D deficiency   . Anemia, unspecified   . Diabetes mellitus     borderline  . Diverticulitis of colon with perforation s/p colectomy/ostomy 13Oct2012 02/23/2011    s/p colostomy  . Carotid stenosis     a.  02/2011 - RICA 80-99% and LICA 60-79%;  b. s/p R CEA Dr. Darrick Penna 09/2011  . LV dysfunction     a.  Echo 10/2010 (in setting of NSTEMI at [facility] in [facility]):  mid ot apical septal AK, EF 40-45%, MAC, mild to mod MR, mild TR, no pulmo HTN;  b. Echo 02/2012: EF 45-50% with WMA, mild-mod MR.  Marland Kitchen PAD (peripheral artery disease)     a. ABIs 9/13: R 0.55; L 0.93;  b. arterial insufficiency ulcers on right foot;  followed by [facility];  c. angiogram 01/2012 with severe bilat SFA and pop disease => not good bypass candidate and PTA not an option => prob continue conservative mgmt  . Hypothyroid   .  Fall at home Feb. 6, 2014    Left leg   . Complication of anesthesia     Allergie to narcotics and abx.  . Family history of anesthesia complication     "mother had allergies too" (05/20/2012)  . Anginal pain   . Pneumonia     "a few times; always associated w/surgeries" (05/20/2012)  . GERD (gastroesophageal reflux disease)   . Gastric ulcer     "just found these today" (05/20/2012)  . Arthritis     "legs, arm, bones; Chelation therapy cleared all that" (05/20/2012)  . History of blood transfusion     "many" (05/20/2012)  . Toe  ulcer, right     5th toe; due to PVD/notes 05/20/2012  . Myocardial infarction ~ 1952; Feb. 1, 2014    "I've had a total of 8 as of 02/2012" (05/20/2012)  . NSTEMI (non-ST elevated myocardial infarction) 05/20/2012    [name] 05/20/2012  . Colostomy in place    Past Surgical History  Procedure Laterality Date  . Total hip arthroplasty Right 07/2004  . Colostomy  10/2010    for complex diverticular disease.  not done in [place]  . Cesarean section  1955, 1959, 1960  . Abdominal adhesion surgery  1976    at time of hysterectomy  . Endarterectomy  10/20/2011    Procedure: ENDARTERECTOMY CAROTID;  Surgeon: Sherren Kerns, MD;  Location: [facility] OR;  Service: Vascular;  Laterality: Right;  Right Carotid Endarterectomy with patch angioplasty  . Joint replacement  2006    Right  Hip  . Appendectomy  1943  . Abdominal hysterectomy  1976  . Combined hysterectomy abdominal w/ a&p repair / oophorectomy  1976    "left a piece of one ovary" 94/25/2014)  . Cardiac catheterization    . Colostomy  10/2010  . Cataract extraction w/ intraocular lens  implant, bilateral Bilateral ~ 2013  . Dilation and curettage of uterus  1950's    "3-4 during childbirth time" (05/20/2012)  . Colectomy  10/2009    sigmoid/notes 05/20/2012  . Esophagogastroduodenoscopy N/A 05/20/2012    Procedure: ESOPHAGOGASTRODUODENOSCOPY (EGD);  Surgeon: Louis Meckel, MD;  Location: [facility] ENDOSCOPY;  Service: Endoscopy;  Laterality: N/A;   Family History  Problem Relation Age of Onset  . Heart disease Mother 30    Pacemaker, No CAD Heart Disease before age 19  . Diverticulitis Mother   . Diabetes Mother   . Hyperlipidemia Mother   . Hypertension Mother   . Lung disease Father 22    Smoking  . Ovarian cancer Daughter   . Cancer Daughter   . Colon cancer Neg Hx   . Heart disease Maternal Grandmother   . Diabetes Maternal Aunt     x 2  . Diabetes Sister   . Heart disease Sister     Heart Disease before age 17  . Hyperlipidemia Sister    . Hypertension Sister    History   Social History  . Marital Status: Widowed    Spouse Name: N/A    Number of Children: N/A  . Years of Education: N/A   Occupational History  . Not on file.   Social History Main Topics  . Smoking status: Never Smoker   . Smokeless tobacco: Never Used  . Alcohol Use: No  . Drug Use: No  . Sexually Active: No   Other Topics Concern  . Not on file   Social History Narrative  . No narrative on file   Allergies: Aspirin;  Benzoin; Cephalexin; Cephalosporins; Codeine; Contrast media; Demerol; Dilaudid; Insulins; Morphine and related; Other; Penicillins; Sulfa antibiotics; Valium; Vistaril; Carafate; Latex; and Lisinopril  Home Meds: Prior to Admission medications   Medication Sig Start Date End Date Taking? Authorizing Provider  acetaminophen-codeine (TYLENOL #3) 300-30 MG per tablet Take 1-2 tablets by mouth every 4 (four) hours as needed for pain.   Yes Historical Provider, MD  aspirin EC 81 MG tablet Take 81 mg by mouth daily.  04/20/11  Yes Beatrice Lecher, PA-C  DiphenhydrAMINE HCl (BENADRYL PO) Take 30 mLs by mouth as needed. For cough   Yes Historical Provider, MD  ezetimibe (ZETIA) 10 MG tablet Take 1 tablet (10 mg total) by mouth daily. 04/06/12  Yes Rollene Rotunda, MD  furosemide (LASIX) 20 MG tablet Take 1 tablet (20 mg total) by mouth daily. 07/05/12  Yes Mahima Pandey, MD  levothyroxine (SYNTHROID, LEVOTHROID) 25 MCG tablet Take 25 mcg by mouth daily.   Yes Historical Provider, MD  Melatonin 5 MG CAPS Take 10 mg by mouth at bedtime. To help sleep   Yes Historical Provider, MD  metoprolol (LOPRESSOR) 50 MG tablet Take 0.5 tablets (25 mg total) by mouth 2 (two) times daily. 05/23/12  Yes Nishant Dhungel, MD  Multiple Vitamins-Minerals (ADULT GUMMY PO) Take 2 tablets by mouth daily.   Yes Historical Provider, MD  naproxen sodium (ANAPROX) 220 MG tablet Take 220 mg by mouth 2 (two) times daily with a meal.   Yes Historical Provider, MD   nitroGLYCERIN (NITROSTAT) 0.4 MG SL tablet Place 0.4 mg under the tongue every 5 (five) minutes as needed for chest pain.   Yes Historical Provider, MD  pantoprazole (PROTONIX) 40 MG tablet Take 40 mg by mouth daily.   Yes Historical Provider, MD  saccharomyces boulardii (FLORASTOR) 250 MG capsule Take 1 capsule (250 mg total) by mouth 2 (two) times daily. 07/05/12  Yes Mahima Glade Lloyd, MD  doxycycline (VIBRAMYCIN) 50 MG capsule Take 2 capsules (100 mg total) by mouth 2 (two) times daily. 07/05/12   Oneal Grout, MD    Physical Exam: Blood pressure 150/62, pulse 70, temperature 98.6 F (37 C), temperature source Oral, resp. rate 16, SpO2 100.00%. General: Awake, Oriented x3, No acute distress. HEENT: EOMI, Moist mucous membranes Neck: Supple CV: S1 and S2 Lungs: Clear to ascultation bilaterally Abdomen: Soft, Nontender, Nondistended, +bowel sounds. Ext: Good pulses.  1+ edema. No clubbing or cyanosis noted.  Small healing ulcer over the right small toe, does not appear to be infected. Neuro: Cranial Nerves II-XII grossly intact. Has 5/5 motor strength in upper and lower extremities.  Lab results:  Recent Labs  08/26/12 1559 08/26/12 1613  NA 140 140  K 4.3 4.3  CL 102 105  CO2 26  --   GLUCOSE 158* 160*  BUN 37* 35*  CREATININE 1.33* 1.50*  CALCIUM 9.1  --    No results found for this basename: AST, ALT, ALKPHOS, BILITOT, PROT, ALBUMIN,  in the last 72 hours No results found for this basename: LIPASE, AMYLASE,  in the last 72 hours  Recent Labs  08/26/12 1559 08/26/12 1613  WBC 8.4  --   NEUTROABS 4.9  --   HGB 9.3* 9.9*  HCT 28.4* 29.0*  MCV 83.3  --   PLT 263  --     Recent Labs  08/26/12 1559  TROPONINI <0.30   No components found with this basename: POCBNP,  No results found for this basename: DDIMER,  in the last 72 hours  No results found for this basename: HGBA1C,  in the last 72 hours No results found for this basename: CHOL, HDL, LDLCALC, TRIG, CHOLHDL,  LDLDIRECT,  in the last 72 hours No results found for this basename: TSH, T4TOTAL, FREET3, T3FREE, THYROIDAB,  in the last 72 hours No results found for this basename: VITAMINB12, FOLATE, FERRITIN, TIBC, IRON, RETICCTPCT,  in the last 72 hours Imaging results:  Dg Chest Port 1 View  08/26/2012   *RADIOLOGY REPORT*  Clinical Data: Chest pain which radiated into both upper extremities earlier today.  Prior history of MI.  PORTABLE CHEST - 1 VIEW  Comparison: Portable chest x-ray 05/20/2012, 02/27/2012 and two- view chest x-ray 09/30/2011.  Findings: Cardiac silhouette upper normal in size for AP portable technique.  Thoracic aorta atherosclerotic, unchanged.  Hilar and mediastinal contours otherwise unremarkable.  Lungs clear. Bronchovascular markings normal.  Pulmonary vascularity normal.  No pneumothorax.  No pleural effusions.  IMPRESSION: Stable borderline heart size.  No acute cardiopulmonary disease.   Original Report Authenticated By: Hulan Saas, M.D.   Other results: EKG: Sinus rhythm with a heart rate of 75 , nonspecific T wave changes in leads V1 through V3.  Assessment & Plan by Problem: Chest pain Admit the patient to telemetry, cycle cardiac enzymes to rule the patient out for acute coronary syndrome.  Has had a recent history of non-ST elevation MI, thought to be due to anemia from acute GI bleed.  Currently patient is chest pain-free.  Continue metoprolol and aspirin.  Patient has severe three-vessel coronary disease, likely will need medical management, as per cardiology.  Requested cardiology consultation, discussed with Dr. Hiram Comber, covering cardiologist.  Hypertension Stable.  Continue home antihypertensive medications.  Coronary artery disease/cardiomyopathy Stable.  Management as indicated above.  EF 45%.  Diabetes Appears to be diet controlled.  Last hemoglobin A1c 7.0 in June of 2014.  Patient has an insulin allergy, not started on any sliding scale insulin.  Continue  to monitor.  Diabetic diet.  Has been on metformin in the past, currently not listed on her medication list.  Right toe osteomyelitis Completed course of antibiotics.  Do not see any active infection at this time.  Hypothyroidism Continue levothyroxin.  Carotid artery stenosis Stable.  History of diverticulitis of the colon status post colostomy Stable.  Anemia Recent history of upper GI bleed status post transfusion in April of 2014, endoscopy shows multiple gastric ulcers.  Continue PPI.  GERD Continue PPI.  Acute renal insufficiency Continue to monitor.  Hold patient's furosemide for now.  If renal function does not improve in the morning, consider further workup.  Prophylaxis Lovenox.  CODE STATUS Full code.  This was discussed with the patient at the time of admission.  Disposition Admit the patient to telemetry as observation.  Time spent on admission, talking to the patient, and coordinating care was: 60 mins.  Asuna Peth A, MD 08/26/2012, 9:05 PM  NSTEMI Patient's second troponin was positive at 0.81.  Spoke to cardiology on call, Dr. Hiram Comber.  As patient has had history of gastric and duodenal ulcers in the past which contributed to her anemia, start heparin per pharmacy.  Continue aspirin.  Recommended holding Plavix at this time.  Patient currently is chest pain-free.  Cardiology will evaluate the patient tonight.  Change the patient's status to inpatient.  Naureen Benton A, MD 08/27/2012, 1:13 AM

## 2012-08-26 NOTE — ED Notes (Signed)
Portable at bedside 

## 2012-08-26 NOTE — ED Provider Notes (Addendum)
CSN: [id number]     Arrival date & time 08/26/12  1530 History     First MD Initiated Contact with Patient 08/26/12 1531     Chief Complaint  Patient presents with  . Chest Pain   (Consider location/radiation/quality/duration/timing/severity/associated sxs/prior Treatment) HPI Comments: Patient presents for evaluation of chest pain. Patient reports a significant cardiac history in the past. Patient just flew in last night for visit one of her sons. When she woke up this morning she went to try and make some coffee at breakfast, though she developed a tightness in the center of her chest. She reports that this felt just like when she has had her heart attacks. She took 3 sublingual nitroglycerin by herself and had some improvement initially, but the pain came back. EMS was called. She had ST depressions on her EKG, was given aspirin and additional nitroglycerin. Patient reports that she is now pain-free. She is not short of breath. No nausea or diaphoresis.  Patient is a 79 y.o. female presenting with chest pain.  Chest Pain Associated symptoms: no shortness of breath     Past Medical History  Diagnosis Date  . Coronary artery disease     a. h/o NSTEMI 2012 in setting of AFib/perf'd divertic (in [place]. - coded, CPR, intubation during hospital stay);  b.  MV 2/13: EF 57%, mod isch septum and poss small part of the Ant wall;  c.  LHC 04/06/11: diffuse 3v CAD no high grade lesions amenable to PCI, surgery would be incomplete thus med rx recommended. d. NSTEMI 02/2012 managed medically.  . Atrial fibrillation     in setting of sepsis in [place]; tx with amiodarone (amio stopped 04/20/11);    Marland Kitchen Injury to liver   . Allergic rhinitis due to pollen   . Hyperlipidemia   . Hypertension   . Vitamin D deficiency   . Anemia, unspecified   . Diabetes mellitus     borderline  . Diverticulitis of colon with perforation s/p colectomy/ostomy 13Oct2012 02/23/2011    s/p colostomy  . Carotid stenosis     a.   02/2011 - RICA 80-99% and LICA 60-79%;  b. s/p R CEA Dr. Darrick Penna 09/2011  . LV dysfunction     a.  Echo 10/2010 (in setting of NSTEMI at [facility] in [facility]):  mid ot apical septal AK, EF 40-45%, MAC, mild to mod MR, mild TR, no pulmo HTN;  b. Echo 02/2012: EF 45-50% with WMA, mild-mod MR.  Marland Kitchen PAD (peripheral artery disease)     a. ABIs 9/13: R 0.55; L 0.93;  b. arterial insufficiency ulcers on right foot;  followed by [facility];  c. angiogram 01/2012 with severe bilat SFA and pop disease => not good bypass candidate and PTA not an option => prob continue conservative mgmt  . Hypothyroid   . Fall at home Feb. 6, 2014    Left leg   . Complication of anesthesia     Allergie to narcotics and abx.  . Family history of anesthesia complication     "mother had allergies too" (05/20/2012)  . Anginal pain   . Pneumonia     "a few times; always associated w/surgeries" (05/20/2012)  . GERD (gastroesophageal reflux disease)   . Gastric ulcer     "just found these today" (05/20/2012)  . Arthritis     "legs, arm, bones; Chelation therapy cleared all that" (05/20/2012)  . History of blood transfusion     "many" (05/20/2012)  . Toe ulcer, right  5th toe; due to PVD/notes 05/20/2012  . Myocardial infarction ~ 1952; Feb. 1, 2014    "I've had a total of 8 as of 02/2012" (05/20/2012)  . NSTEMI (non-ST elevated myocardial infarction) 05/20/2012    [name] 05/20/2012  . Colostomy in place    Past Surgical History  Procedure Laterality Date  . Total hip arthroplasty Right 07/2004  . Colostomy  10/2010    for complex diverticular disease.  not done in [place]  . Cesarean section  1955, 1959, 1960  . Abdominal adhesion surgery  1976    at time of hysterectomy  . Endarterectomy  10/20/2011    Procedure: ENDARTERECTOMY CAROTID;  Surgeon: Sherren Kerns, MD;  Location: [facility] OR;  Service: Vascular;  Laterality: Right;  Right Carotid Endarterectomy with patch angioplasty  . Joint replacement  2006    Right  Hip  .  Appendectomy  1943  . Abdominal hysterectomy  1976  . Combined hysterectomy abdominal w/ a&p repair / oophorectomy  1976    "left a piece of one ovary" 94/25/2014)  . Cardiac catheterization    . Colostomy  10/2010  . Cataract extraction w/ intraocular lens  implant, bilateral Bilateral ~ 2013  . Dilation and curettage of uterus  1950's    "3-4 during childbirth time" (05/20/2012)  . Colectomy  10/2009    sigmoid/notes 05/20/2012  . Esophagogastroduodenoscopy N/A 05/20/2012    Procedure: ESOPHAGOGASTRODUODENOSCOPY (EGD);  Surgeon: Louis Meckel, MD;  Location: [facility] ENDOSCOPY;  Service: Endoscopy;  Laterality: N/A;   Family History  Problem Relation Age of Onset  . Heart disease Mother 57    Pacemaker, No CAD Heart Disease before age 33  . Diverticulitis Mother   . Diabetes Mother   . Hyperlipidemia Mother   . Hypertension Mother   . Lung disease Father 51    Smoking  . Ovarian cancer Daughter   . Cancer Daughter   . Colon cancer Neg Hx   . Heart disease Maternal Grandmother   . Diabetes Maternal Aunt     x 2  . Diabetes Sister   . Heart disease Sister     Heart Disease before age 84  . Hyperlipidemia Sister   . Hypertension Sister    History  Substance Use Topics  . Smoking status: Never Smoker   . Smokeless tobacco: Never Used  . Alcohol Use: No   OB History   Grav Para Term Preterm Abortions TAB SAB Ect Mult Living                 Review of Systems  Respiratory: Negative for shortness of breath.   Cardiovascular: Positive for chest pain.  Gastrointestinal: Negative.   All other systems reviewed and are negative.    Allergies  Demerol; Codeine; Benzoin; Carafate; Cephalexin; Cephalosporins; Contrast media; Dilaudid; Insulins; Keflex; Lisinopril; Morphine and related; Other; Penicillins; Sulfa antibiotics; Valium; Vistaril; Aspirin; and Latex  Home Medications   Current Outpatient Rx  Name  Route  Sig  Dispense  Refill  . acetaminophen-codeine (TYLENOL #3)  300-30 MG per tablet      TAKE ONE TO TWO TABLETS BY MOUTH EVERY 4 HOURS AS NEEDED   180 tablet   0   . aspirin EC 81 MG tablet   Oral   Take 81 mg by mouth daily.          . Calcium Carbonate-Vitamin D (CALCIUM 600 + D PO)   Oral   Take 1 tablet by mouth 2 (two) times daily.         Marland Kitchen  DiphenhydrAMINE HCl (BENADRYL PO)   Oral   Take 30 mLs by mouth as needed. For cough         . doxycycline (VIBRAMYCIN) 50 MG capsule   Oral   Take 2 capsules (100 mg total) by mouth 2 (two) times daily.   28 capsule   0   . ezetimibe (ZETIA) 10 MG tablet   Oral   Take 1 tablet (10 mg total) by mouth daily.   30 tablet   11   . furosemide (LASIX) 20 MG tablet   Oral   Take 1 tablet (20 mg total) by mouth daily.   30 tablet   3   . levothyroxine (SYNTHROID, LEVOTHROID) 25 MCG tablet   Oral   Take 25 mcg by mouth daily.         . Melatonin 5 MG CAPS   Oral   Take 5 mg by mouth at bedtime as needed. To help sleep         . metoprolol (LOPRESSOR) 50 MG tablet   Oral   Take 0.5 tablets (25 mg total) by mouth 2 (two) times daily.   60 tablet   6   . Multiple Vitamins-Minerals (ADULT GUMMY PO)   Oral   Take 2 tablets by mouth daily.         . nitroGLYCERIN (NITROSTAT) 0.4 MG SL tablet      PLACE 1 TABLET (0.4 TOTAL) UNDER THE TONGUE EVERY 5 (FIVE) MINUTES X 3 DOSES AS NEEDED FOR CHEST PAIN   25 tablet   1   . pantoprazole (PROTONIX) 40 MG tablet      TAKE ONE TABLET BY MOUTH ONCE DAILY   60 tablet   0   . saccharomyces boulardii (FLORASTOR) 250 MG capsule   Oral   Take 1 capsule (250 mg total) by mouth 2 (two) times daily.   60 capsule   0    BP 139/69  Pulse 75  Temp(Src) 98.6 F (37 C) (Oral)  Resp 20  SpO2 100% Physical Exam  Constitutional: She is oriented to person, place, and time. She appears well-developed and well-nourished. No distress.  HENT:  Head: Normocephalic and atraumatic.  Right Ear: Hearing normal.  Left Ear: Hearing normal.   Nose: Nose normal.  Mouth/Throat: Oropharynx is clear and moist and mucous membranes are normal.  Eyes: Conjunctivae and EOM are normal. Pupils are equal, round, and reactive to light.  Neck: Normal range of motion. Neck supple.  Cardiovascular: Regular rhythm, S1 normal and S2 normal.  Exam reveals no gallop and no friction rub.   No murmur heard. Pulmonary/Chest: Effort normal and breath sounds normal. No respiratory distress. She exhibits no tenderness.  Abdominal: Soft. Normal appearance and bowel sounds are normal. There is no hepatosplenomegaly. There is no tenderness. There is no rebound, no guarding, no tenderness at McBurney's point and negative Murphy's sign. No hernia.  Musculoskeletal: Normal range of motion.  Neurological: She is alert and oriented to person, place, and time. She has normal strength. No cranial nerve deficit or sensory deficit. Coordination normal. GCS eye subscore is 4. GCS verbal subscore is 5. GCS motor subscore is 6.  Skin: Skin is warm, dry and intact. No rash noted. No cyanosis.  Psychiatric: She has a normal mood and affect. Her speech is normal and behavior is normal. Thought content normal.    ED Course   Procedures (including critical care time)  EKG:  Date: 08/26/2012  Rate: 75  Rhythm: normal sinus  rhythm  QRS Axis: normal  Intervals: normal  ST/T Wave abnormalities: nonspecific T wave changes  Conduction Disutrbances:none  Narrative Interpretation:   Old EKG Reviewed: unchanged    Labs Reviewed  CBC WITH DIFFERENTIAL - Abnormal; Notable for the following:    RBC 3.41 (*)    Hemoglobin 9.3 (*)    HCT 28.4 (*)    RDW 17.7 (*)    All other components within normal limits  BASIC METABOLIC PANEL - Abnormal; Notable for the following:    Glucose, Bld 158 (*)    BUN 37 (*)    Creatinine, Ser 1.33 (*)    GFR calc non Af Amer 37 (*)    GFR calc Af Amer 43 (*)    All other components within normal limits  PRO B NATRIURETIC PEPTIDE -  Abnormal; Notable for the following:    Pro B Natriuretic peptide (BNP) 1401.0 (*)    All other components within normal limits  POCT I-STAT, CHEM 8 - Abnormal; Notable for the following:    BUN 35 (*)    Creatinine, Ser 1.50 (*)    Glucose, Bld 160 (*)    Hemoglobin 9.9 (*)    HCT 29.0 (*)    All other components within normal limits  TROPONIN I  POCT I-STAT TROPONIN I   No results found.  Diagnosis: Chest pain  MDM  Patient is now pain-free. I did review the EKG from EMS and she did have ST depressions in her lateral precordial leads. Compared to an old EKG, there may have been there slightly in the past, more pronounced on the EMS EKG. EKG on arrival to the ER, however, does not show any depressions. Initial EKG was performed chest pain, this EKG in the ER was pain-free. This is concerning for possible ischemia.  Patient continues to be pain-free here in the ER. Workup including troponin is negative. She sees Doctor Hochrein as her primary cardiologist. [facility] cardiology consult obtained, as she is pain-free currently and reported as negative, it is felt she can be watched by internal medicine.  Gilda Crease, MD 08/26/12 1729  Gilda Crease, MD 08/26/12 [phone]

## 2012-08-26 NOTE — ED Notes (Signed)
Pt to ED via EMS with c/o chest pain, onset at 1100 today. Pt denies other symptoms. Alert and oriented x4 and ambulate from the EMS stretcher to ED stretcher. Per EMS, 12-lead EKG showed ST depression and 15-leads unremarkable. Pt states she took nitro x3 and EMS given nitro x3. EMS given 325 mg ASA. Hx MI 2 months ago. BP-140/72, HR-78, RR-18, SpO2-99% on room air.

## 2012-08-27 DIAGNOSIS — I428 Other cardiomyopathies: Secondary | ICD-10-CM

## 2012-08-27 DIAGNOSIS — K5732 Diverticulitis of large intestine without perforation or abscess without bleeding: Secondary | ICD-10-CM

## 2012-08-27 DIAGNOSIS — I4891 Unspecified atrial fibrillation: Secondary | ICD-10-CM

## 2012-08-27 DIAGNOSIS — I214 Non-ST elevation (NSTEMI) myocardial infarction: Secondary | ICD-10-CM

## 2012-08-27 LAB — TYPE AND SCREEN
ABO/RH(D): O POS
Antibody Screen: NEGATIVE

## 2012-08-27 LAB — BASIC METABOLIC PANEL
CO2: 27 mEq/L (ref 19–32)
Chloride: 101 mEq/L (ref 96–112)
Creatinine, Ser: 1.18 mg/dL — ABNORMAL HIGH (ref 0.50–1.10)
Glucose, Bld: 166 mg/dL — ABNORMAL HIGH (ref 70–99)

## 2012-08-27 LAB — TROPONIN I
Troponin I: 0.52 ng/mL (ref <0.30)
Troponin I: <0.3 ng/mL (ref <0.30)
Troponin I: <0.3 ng/mL (ref <0.30)
Troponin I: <0.3 ng/mL (ref <0.30)

## 2012-08-27 LAB — CBC
Platelets: 228 10*3/uL (ref 150–400)
RBC: 3.26 MIL/uL — ABNORMAL LOW (ref 3.87–5.11)
WBC: 9.9 10*3/uL (ref 4.0–10.5)

## 2012-08-27 MED ORDER — HEPARIN (PORCINE) IN NACL 100-0.45 UNIT/ML-% IJ SOLN
750.0000 [IU]/h | INTRAMUSCULAR | Status: DC
  Administered 2012-08-27: 750 [IU]/h via INTRAVENOUS
  Administered 2012-08-28: 700 [IU]/h via INTRAVENOUS
  Filled 2012-08-27 (×3): qty 250

## 2012-08-27 MED ORDER — MUPIROCIN 2 % EX OINT
1.0000 "application " | TOPICAL_OINTMENT | Freq: Two times a day (BID) | CUTANEOUS | Status: DC
  Administered 2012-08-27 – 2012-08-29 (×6): 1 via NASAL
  Filled 2012-08-27: qty 22

## 2012-08-27 MED ORDER — CHLORHEXIDINE GLUCONATE CLOTH 2 % EX PADS
6.0000 | MEDICATED_PAD | Freq: Every day | CUTANEOUS | Status: DC
  Administered 2012-08-27 – 2012-08-29 (×3): 6 via TOPICAL

## 2012-08-27 MED ORDER — RANOLAZINE ER 500 MG PO TB12
500.0000 mg | ORAL_TABLET | Freq: Two times a day (BID) | ORAL | Status: DC
  Administered 2012-08-27 – 2012-08-29 (×5): 500 mg via ORAL
  Filled 2012-08-27 (×6): qty 1

## 2012-08-27 MED ORDER — HEPARIN BOLUS VIA INFUSION
3000.0000 [IU] | Freq: Once | INTRAVENOUS | Status: AC
  Administered 2012-08-27: 3000 [IU] via INTRAVENOUS
  Filled 2012-08-27: qty 3000

## 2012-08-27 NOTE — Progress Notes (Signed)
ANTICOAGULATION CONSULT NOTE - Initial Consult  Pharmacy Consult for heparin Indication: NSTEMI  Allergies  Allergen Reactions  . Aspirin Other (See Comments)    High doses of aspirin puts patient into COMA; currently taking low dose aspirin  . Benzoin Other (See Comments)    Puts patient into COMA  . Cephalexin Rash and Other (See Comments)    Puts patient into COMA  . Cephalosporins Other (See Comments)    Puts patient into COMA  . Codeine Swelling and Rash    CANNOT TAKE Injectable. Can take PO form  . Contrast Media (Iodinated Diagnostic Agents) Other (See Comments)    Causes SYNCOPE / HYPOTENSION  . Demerol Other (See Comments)    Puts patient into COMA  . Dilaudid (Hydromorphone Hcl) Other (See Comments)    Puts patient into COMA  . Insulins Other (See Comments)    "reacted violently to medications" unsure about what that means  . Morphine And Related Other (See Comments)    Puts patient into COMA  . Other Other (See Comments)    Puts patient into COMA.Patient highly sensitive to antibiotics, narcotics and any medication used to put patient to sleep for surgery.  . Penicillins Other (See Comments)    Puts patient into COMA  . Sulfa Antibiotics Other (See Comments)    Puts patient into COMA  . Valium (Diazepam) Other (See Comments)    VERY CRAZY FEELINGS AND ACTIONS, CLIMBING THE WALLS TYPE REACTION  . Vistaril (Hydroxyzine Hcl) Other (See Comments)    CAUSES PATIENT  TO FEEL LIKE "CLIMBING THE WALLS TYPE OF REACTION  . Carafate (Sucralfate) Nausea And Vomiting  . Latex Itching and Rash  . Lisinopril Nausea And Vomiting and Cough    Patient Measurements: Height: 5\' 2"  (157.5 cm) Weight: 129 lb 12.6 oz (58.87 kg) IBW/kg (Calculated) : 50.1  Vital Signs: Temp: 98 F (36.7 C) (08/02 0411) Temp src: Oral (08/02 0411) BP: 117/42 mmHg (08/02 0411) Pulse Rate: 62 (08/02 0411)  Labs:  Recent Labs  08/26/12 1559 08/26/12 1613 08/26/12 2129 08/26/12 2205  08/27/12 0405 08/27/12 0840  HGB 9.3* 9.9*  --  9.7* 8.9*  --   HCT 28.4* 29.0*  --  29.7* 27.2*  --   PLT 263  --   --  246 228  --   HEPARINUNFRC  --   --   --   --   --  0.70  CREATININE 1.33* 1.50*  --  1.25* 1.18*  --   TROPONINI <0.30  --  0.87*  --  0.52*  --     Estimated Creatinine Clearance: 30.1 ml/min (by C-G formula based on Cr of 1.18).   Medical History: Past Medical History  Diagnosis Date  . Coronary artery disease     a. h/o NSTEMI 2012 in setting of AFib/perf'd divertic (in [place]. - coded, CPR, intubation during hospital stay);  b.  MV 2/13: EF 57%, mod isch septum and poss small part of the Ant wall;  c.  LHC 04/06/11: diffuse 3v CAD no high grade lesions amenable to PCI, surgery would be incomplete thus med rx recommended. d. NSTEMI 02/2012 managed medically.  . Atrial fibrillation     in setting of sepsis in [place]; tx with amiodarone (amio stopped 04/20/11);    Marland Kitchen Injury to liver   . Allergic rhinitis due to pollen   . Hyperlipidemia   . Hypertension   . Vitamin D deficiency   . Anemia, unspecified   . Diabetes mellitus  borderline  . Diverticulitis of colon with perforation s/p colectomy/ostomy 13Oct2012 02/23/2011    s/p colostomy  . Carotid stenosis     a.  02/2011 - RICA 80-99% and LICA 60-79%;  b. s/p R CEA Dr. Darrick Penna 09/2011  . LV dysfunction     a.  Echo 10/2010 (in setting of NSTEMI at [facility] in [facility]):  mid ot apical septal AK, EF 40-45%, MAC, mild to mod MR, mild TR, no pulmo HTN;  b. Echo 02/2012: EF 45-50% with WMA, mild-mod MR.  Marland Kitchen PAD (peripheral artery disease)     a. ABIs 9/13: R 0.55; L 0.93;  b. arterial insufficiency ulcers on right foot;  followed by [facility];  c. angiogram 01/2012 with severe bilat SFA and pop disease => not good bypass candidate and PTA not an option => prob continue conservative mgmt  . Hypothyroid   . Fall at home Feb. 6, 2014    Left leg   . Complication of anesthesia     Allergie to narcotics and abx.  . Family history  of anesthesia complication     "mother had allergies too" (05/20/2012)  . Anginal pain   . Pneumonia     "a few times; always associated w/surgeries" (05/20/2012)  . GERD (gastroesophageal reflux disease)   . Gastric ulcer     "just found these today" (05/20/2012)  . Arthritis     "legs, arm, bones; Chelation therapy cleared all that" (05/20/2012)  . History of blood transfusion     "many" (05/20/2012)  . Toe ulcer, right     5th toe; due to PVD/notes 05/20/2012  . Myocardial infarction ~ 1952; Feb. 1, 2014    "I've had a total of 8 as of 02/2012" (05/20/2012)  . NSTEMI (non-ST elevated myocardial infarction) 05/20/2012    Julia Greene 05/20/2012  . Colostomy in place     Medications:  Prescriptions prior to admission  Medication Sig Dispense Refill  . acetaminophen-codeine (TYLENOL #3) 300-30 MG per tablet Take 1-2 tablets by mouth every 4 (four) hours as needed for pain.      Marland Kitchen aspirin EC 81 MG tablet Take 81 mg by mouth daily.       . DiphenhydrAMINE HCl (BENADRYL PO) Take 30 mLs by mouth as needed. For cough      . ezetimibe (ZETIA) 10 MG tablet Take 1 tablet (10 mg total) by mouth daily.  30 tablet  11  . furosemide (LASIX) 20 MG tablet Take 1 tablet (20 mg total) by mouth daily.  30 tablet  3  . levothyroxine (SYNTHROID, LEVOTHROID) 25 MCG tablet Take 25 mcg by mouth daily.      . Melatonin 5 MG CAPS Take 10 mg by mouth at bedtime. To help sleep      . metoprolol (LOPRESSOR) 50 MG tablet Take 0.5 tablets (25 mg total) by mouth 2 (two) times daily.  60 tablet  6  . Multiple Vitamins-Minerals (ADULT GUMMY PO) Take 2 tablets by mouth daily.      . naproxen sodium (ANAPROX) 220 MG tablet Take 220 mg by mouth 2 (two) times daily with a meal.      . nitroGLYCERIN (NITROSTAT) 0.4 MG SL tablet Place 0.4 mg under the tongue every 5 (five) minutes as needed for chest pain.      . pantoprazole (PROTONIX) 40 MG tablet Take 40 mg by mouth daily.      Marland Kitchen saccharomyces boulardii (FLORASTOR) 250 MG capsule  Take 1 capsule (250 mg total) by  mouth 2 (two) times daily.  60 capsule  0  . doxycycline (VIBRAMYCIN) 50 MG capsule Take 2 capsules (100 mg total) by mouth 2 (two) times daily.  28 capsule  0   Scheduled:  . aspirin EC  81 mg Oral Daily  . Chlorhexidine Gluconate Cloth  6 each Topical Q0600  . ezetimibe  10 mg Oral Daily  . levothyroxine  25 mcg Oral QAC breakfast  . metoprolol  25 mg Oral BID  . multivitamin with minerals  1 tablet Oral Daily  . mupirocin ointment  1 application Nasal BID  . pantoprazole  40 mg Oral Daily  . ranolazine  500 mg Oral BID  . saccharomyces boulardii  250 mg Oral BID  . sodium chloride  3 mL Intravenous Q12H   Infusions:  . heparin 750 Units/hr (08/27/12 0211)    Assessment: 79yo female c/o sudden onset of chest pressure radiating to arms and jaw, initial troponin negative, now elevated, on heparin, cards aware of anemia. Heparin level at goal, at high end, but level checked early-will decrease rate a little.   Goal of Therapy:  Heparin level 0.3-0.7 units/ml Monitor platelets by anticoagulation protocol: Yes   Plan:  Decrease heparin to 700 units/hr. Check heparin level in 8 hours. Daily heparin level/cbc.  Wendie Simmer, PharmD, BCPS Clinical Pharmacist  Pager: [phone]

## 2012-08-27 NOTE — Consult Note (Signed)
Referring Physician: Thad Ranger MD Primary Physician: Oneal Grout, MD Primary Cardiologist: Rollene Rotunda, MD Reason for Consultation: CP and elevated troponin - recommendations regarding further management.  HPI: Julia Greene is a 79 y.o. Caucasian female with history of coronary artery disease, hypertension, hyperlipidemia, diabetes, carotid stenosis, LV dysfunction with EF of 40-45%, hypothyroidism, GERD, peripheral artery disease, carotid stenosis, who presents with the complaints of chest pain.   This morning around 1130 a.m. while she was preparing breakfast, she had sudden onset of chest pressure that was sharp and severe with radiating down her both arms, with some radiation to her left jaw. The pain was similar to her previous heart attacks. She took 3 sublingual nitroglycerin tablets without relief. She called her son around to 2:20 p.m., given she was still having pain he called EMS. Patient herself thinks that recent increased level of stress because her son was trying to arrange for her to be placed to assisted living facility could have contributed to the chest pain. Patient received few more nitroglycerin on the way to the emergency department, she was chest pain-free by 430 p.m.  patient was admitted to hospitalist service for further workup. Cardiology consultation was requested once troponin turned out to be positive.  Last [facility] 04/06/2011   Left mainstem: Heavy calcification with distal 40% stenosis  Left anterior descending (LAD): Heavy proximal calcification with ostial 70% stenosis followed by long 50% stenosis followed by occlusion after a moderate size first diagonal. The diagonal has ostial 95% stenosis with proximal and mid diffuse 60% stenosis.  Left circumflex (LCx): The circumflex appears to be a large vessel. It is occluded in the AV groove after the first obtuse marginal. The first obtuse marginal is a small branching vessel. There is ostial 90% stenosis. The superior  branch is patent but small. The inferior branch appears to be large but is occluded. There is an extensive network of occluded marginals and posterior laterals after this that fill to some degree by collaterals.  Ramus intermedius (RI): This is a large vessel. There is moderate proximal calcification. There is a long 60% stenosis in the proximal and mid segment. The remainder of the vessel is free of high-grade disease.  Right coronary artery (RCA): Long proximal 40-50% stenosis followed by occlusion after a moderate-sized acute marginal branch. The acute marginal has long 80% ostial stenosis.  Left ventriculography: Left ventricular systolic function is normal, LVEF is estimated at 55-65%, LVEDP=23 mm Hg, there is moderate mitral regurgitation  Final Conclusions: Severe diffuse three-vessel coronary artery disease. Well preserved ejection fraction.  Recommendations: The patient's coronary disease is long-standing severe diffuse and extensively collateralized. At this point there are no high grade lesions amenable to percutaneous revascularization. Any attempts at surgical revascularization would be very incomplete. Given this medical management and continued attempts at risk reduction is the preferred therapy.  Review of Systems: 12 systems were reviewed and were negative except mentioned in history of present illness  Past Medical History  Diagnosis Date  . Coronary artery disease     a. h/o NSTEMI 2012 in setting of AFib/perf'd divertic (in [place]. - coded, CPR, intubation during hospital stay);  b.  MV 2/13: EF 57%, mod isch septum and poss small part of the Ant wall;  c.  LHC 04/06/11: diffuse 3v CAD no high grade lesions amenable to PCI, surgery would be incomplete thus med rx recommended. d. NSTEMI 02/2012 managed medically.  . Atrial fibrillation     in setting of sepsis in [place];  tx with amiodarone (amio stopped 04/20/11);    Marland Kitchen Injury to liver   . Allergic rhinitis due to pollen   . Hyperlipidemia    . Hypertension   . Vitamin D deficiency   . Anemia, unspecified   . Diabetes mellitus     borderline  . Diverticulitis of colon with perforation s/p colectomy/ostomy 13Oct2012 02/23/2011    s/p colostomy  . Carotid stenosis     a.  02/2011 - RICA 80-99% and LICA 60-79%;  b. s/p R CEA Dr. Darrick Penna 09/2011  . LV dysfunction     a.  Echo 10/2010 (in setting of NSTEMI at [facility] in [facility]):  mid ot apical septal AK, EF 40-45%, MAC, mild to mod MR, mild TR, no pulmo HTN;  b. Echo 02/2012: EF 45-50% with WMA, mild-mod MR.  Marland Kitchen PAD (peripheral artery disease)     a. ABIs 9/13: R 0.55; L 0.93;  b. arterial insufficiency ulcers on right foot;  followed by [facility];  c. angiogram 01/2012 with severe bilat SFA and pop disease => not good bypass candidate and PTA not an option => prob continue conservative mgmt  . Hypothyroid   . Fall at home Feb. 6, 2014    Left leg   . Complication of anesthesia     Allergie to narcotics and abx.  . Family history of anesthesia complication     "mother had allergies too" (05/20/2012)  . Anginal pain   . Pneumonia     "a few times; always associated w/surgeries" (05/20/2012)  . GERD (gastroesophageal reflux disease)   . Gastric ulcer     "just found these today" (05/20/2012)  . Arthritis     "legs, arm, bones; Chelation therapy cleared all that" (05/20/2012)  . History of blood transfusion     "many" (05/20/2012)  . Toe ulcer, right     5th toe; due to PVD/notes 05/20/2012  . Myocardial infarction ~ 1952; Feb. 1, 2014    "I've had a total of 8 as of 02/2012" (05/20/2012)  . NSTEMI (non-ST elevated myocardial infarction) 05/20/2012    [name] 05/20/2012  . Colostomy in place    Past Surgical History  Procedure Laterality Date  . Total hip arthroplasty Right 07/2004  . Colostomy  10/2010    for complex diverticular disease.  not done in [place]  . Cesarean section  1955, 1959, 1960  . Abdominal adhesion surgery  1976    at time of hysterectomy  . Endarterectomy   10/20/2011    Procedure: ENDARTERECTOMY CAROTID;  Surgeon: Sherren Kerns, MD;  Location: [facility] OR;  Service: Vascular;  Laterality: Right;  Right Carotid Endarterectomy with patch angioplasty  . Joint replacement  2006    Right  Hip  . Appendectomy  1943  . Abdominal hysterectomy  1976  . Combined hysterectomy abdominal w/ a&p repair / oophorectomy  1976    "left a piece of one ovary" 94/25/2014)  . Cardiac catheterization    . Colostomy  10/2010  . Cataract extraction w/ intraocular lens  implant, bilateral Bilateral ~ 2013  . Dilation and curettage of uterus  1950's    "3-4 during childbirth time" (05/20/2012)  . Colectomy  10/2009    sigmoid/notes 05/20/2012  . Esophagogastroduodenoscopy N/A 05/20/2012    Procedure: ESOPHAGOGASTRODUODENOSCOPY (EGD);  Surgeon: Louis Meckel, MD;  Location: [facility] ENDOSCOPY;  Service: Endoscopy;  Laterality: N/A;     Current Medications: . aspirin EC  81 mg Oral Daily  . Chlorhexidine Gluconate Cloth  6 each Topical Q0600  . ezetimibe  10 mg Oral Daily  . levothyroxine  25 mcg Oral QAC breakfast  . metoprolol  25 mg Oral BID  . multivitamin with minerals  1 tablet Oral Daily  . mupirocin ointment  1 application Nasal BID  . pantoprazole  40 mg Oral Daily  . saccharomyces boulardii  250 mg Oral BID  . sodium chloride  3 mL Intravenous Q12H   Infusions:  . heparin 750 Units/hr (08/27/12 0211)    Prescriptions prior to admission  Medication Sig Dispense Refill  . acetaminophen-codeine (TYLENOL #3) 300-30 MG per tablet Take 1-2 tablets by mouth every 4 (four) hours as needed for pain.      Marland Kitchen aspirin EC 81 MG tablet Take 81 mg by mouth daily.       . DiphenhydrAMINE HCl (BENADRYL PO) Take 30 mLs by mouth as needed. For cough      . ezetimibe (ZETIA) 10 MG tablet Take 1 tablet (10 mg total) by mouth daily.  30 tablet  11  . furosemide (LASIX) 20 MG tablet Take 1 tablet (20 mg total) by mouth daily.  30 tablet  3  . levothyroxine (SYNTHROID,  LEVOTHROID) 25 MCG tablet Take 25 mcg by mouth daily.      . Melatonin 5 MG CAPS Take 10 mg by mouth at bedtime. To help sleep      . metoprolol (LOPRESSOR) 50 MG tablet Take 0.5 tablets (25 mg total) by mouth 2 (two) times daily.  60 tablet  6  . Multiple Vitamins-Minerals (ADULT GUMMY PO) Take 2 tablets by mouth daily.      . naproxen sodium (ANAPROX) 220 MG tablet Take 220 mg by mouth 2 (two) times daily with a meal.      . nitroGLYCERIN (NITROSTAT) 0.4 MG SL tablet Place 0.4 mg under the tongue every 5 (five) minutes as needed for chest pain.      . pantoprazole (PROTONIX) 40 MG tablet Take 40 mg by mouth daily.      Marland Kitchen saccharomyces boulardii (FLORASTOR) 250 MG capsule Take 1 capsule (250 mg total) by mouth 2 (two) times daily.  60 capsule  0  . doxycycline (VIBRAMYCIN) 50 MG capsule Take 2 capsules (100 mg total) by mouth 2 (two) times daily.  28 capsule  0     Allergies  Allergen Reactions  . Aspirin Other (See Comments)    High doses of aspirin puts patient into COMA; currently taking low dose aspirin  . Benzoin Other (See Comments)    Puts patient into COMA  . Cephalexin Rash and Other (See Comments)    Puts patient into COMA  . Cephalosporins Other (See Comments)    Puts patient into COMA  . Codeine Swelling and Rash    CANNOT TAKE Injectable. Can take PO form  . Contrast Media (Iodinated Diagnostic Agents) Other (See Comments)    Causes SYNCOPE / HYPOTENSION  . Demerol Other (See Comments)    Puts patient into COMA  . Dilaudid (Hydromorphone Hcl) Other (See Comments)    Puts patient into COMA  . Insulins Other (See Comments)    "reacted violently to medications" unsure about what that means  . Morphine And Related Other (See Comments)    Puts patient into COMA  . Other Other (See Comments)    Puts patient into COMA.Patient highly sensitive to antibiotics, narcotics and any medication used to put patient to sleep for surgery.  . Penicillins Other (See Comments)  Puts  patient into COMA  . Sulfa Antibiotics Other (See Comments)    Puts patient into COMA  . Valium (Diazepam) Other (See Comments)    VERY CRAZY FEELINGS AND ACTIONS, CLIMBING THE WALLS TYPE REACTION  . Vistaril (Hydroxyzine Hcl) Other (See Comments)    CAUSES PATIENT  TO FEEL LIKE "CLIMBING THE WALLS TYPE OF REACTION  . Carafate (Sucralfate) Nausea And Vomiting  . Latex Itching and Rash  . Lisinopril Nausea And Vomiting and Cough    History   Social History  . Marital Status: Widowed    Spouse Name: N/A    Number of Children: N/A  . Years of Education: N/A   Occupational History  . Not on file.   Social History Main Topics  . Smoking status: Never Smoker   . Smokeless tobacco: Never Used  . Alcohol Use: No  . Drug Use: No  . Sexually Active: No   Other Topics Concern  . Not on file   Social History Narrative  . No narrative on file    Family History  Problem Relation Age of Onset  . Heart disease Mother 40    Pacemaker, No CAD Heart Disease before age 88  . Diverticulitis Mother   . Diabetes Mother   . Hyperlipidemia Mother   . Hypertension Mother   . Lung disease Father 84    Smoking  . Ovarian cancer Daughter   . Cancer Daughter   . Colon cancer Neg Hx   . Heart disease Maternal Grandmother   . Diabetes Maternal Aunt     x 2  . Diabetes Sister   . Heart disease Sister     Heart Disease before age 58  . Hyperlipidemia Sister   . Hypertension Sister    Family Status  Relation Status Death Age  . Mother Deceased 43    complications from pacemaker  . Father Deceased 2    lung problems  . Daughter Alive     PHYSICAL EXAM: Filed Vitals:   08/27/12 0411  BP: 117/42  Pulse: 62  Temp: 98 F (36.7 C)  Resp: 18    No intake or output data in the 24 hours ending 08/27/12 0531  General:  Well appearing. No respiratory difficulty HEENT: normal Neck: supple. no JVD. Carotids 2+ bilat; no bruits. No lymphadenopathy or thryomegaly appreciated. Cor:  PMI nondisplaced. Regular rate & rhythm. No rubs, gallops or murmurs. Lungs: clear Abdomen: soft, nontender, nondistended. No hepatosplenomegaly. No bruits or masses. Good bowel sounds. Extremities: no cyanosis, clubbing, rash, edema Neuro: alert & oriented x 3, cranial nerves grossly intact. moves all 4 extremities w/o difficulty. Affect pleasant.  ECG: ECHO: Results for orders placed during the hospital encounter of 08/26/12 (from the past 24 hour(s))  CBC WITH DIFFERENTIAL     Status: Abnormal   Collection Time    08/26/12  3:59 PM      Result Value Range   WBC 8.4  4.0 - 10.5 K/uL   RBC 3.41 (*) 3.87 - 5.11 MIL/uL   Hemoglobin 9.3 (*) 12.0 - 15.0 g/dL   HCT 16.1 (*) 09.6 - 04.5 %   MCV 83.3  78.0 - 100.0 fL   MCH 27.3  26.0 - 34.0 pg   MCHC 32.7  30.0 - 36.0 g/dL   RDW 40.9 (*) 81.1 - 91.4 %   Platelets 263  150 - 400 K/uL   Neutrophils Relative % 59  43 - 77 %   Neutro Abs 4.9  1.7 -  7.7 K/uL   Lymphocytes Relative 28  12 - 46 %   Lymphs Abs 2.4  0.7 - 4.0 K/uL   Monocytes Relative 8  3 - 12 %   Monocytes Absolute 0.7  0.1 - 1.0 K/uL   Eosinophils Relative 5  0 - 5 %   Eosinophils Absolute 0.4  0.0 - 0.7 K/uL   Basophils Relative 1  0 - 1 %   Basophils Absolute 0.1  0.0 - 0.1 K/uL  BASIC METABOLIC PANEL     Status: Abnormal   Collection Time    08/26/12  3:59 PM      Result Value Range   Sodium 140  135 - 145 mEq/L   Potassium 4.3  3.5 - 5.1 mEq/L   Chloride 102  96 - 112 mEq/L   CO2 26  19 - 32 mEq/L   Glucose, Bld 158 (*) 70 - 99 mg/dL   BUN 37 (*) 6 - 23 mg/dL   Creatinine, Ser 1.61 (*) 0.50 - 1.10 mg/dL   Calcium 9.1  8.4 - 09.6 mg/dL   GFR calc non Af Amer 37 (*) >90 mL/min   GFR calc Af Amer 43 (*) >90 mL/min  PRO B NATRIURETIC PEPTIDE     Status: Abnormal   Collection Time    08/26/12  3:59 PM      Result Value Range   Pro B Natriuretic peptide (BNP) 1401.0 (*) 0 - 450 pg/mL  TROPONIN I     Status: None   Collection Time    08/26/12  3:59 PM       Result Value Range   Troponin I <0.30  <0.30 ng/mL  POCT I-STAT TROPONIN I     Status: None   Collection Time    08/26/12  4:11 PM      Result Value Range   Troponin i, poc 0.04  0.00 - 0.08 ng/mL   Comment 3           POCT I-STAT, CHEM 8     Status: Abnormal   Collection Time    08/26/12  4:13 PM      Result Value Range   Sodium 140  135 - 145 mEq/L   Potassium 4.3  3.5 - 5.1 mEq/L   Chloride 105  96 - 112 mEq/L   BUN 35 (*) 6 - 23 mg/dL   Creatinine, Ser 0.45 (*) 0.50 - 1.10 mg/dL   Glucose, Bld 409 (*) 70 - 99 mg/dL   Calcium, Ion 8.11  9.14 - 1.30 mmol/L   TCO2 24  0 - 100 mmol/L   Hemoglobin 9.9 (*) 12.0 - 15.0 g/dL   HCT 78.2 (*) 95.6 - 21.3 %  GLUCOSE, CAPILLARY     Status: None   Collection Time    08/26/12  9:05 PM      Result Value Range   Glucose-Capillary 88  70 - 99 mg/dL   Comment 1 Notify RN    TROPONIN I     Status: Abnormal   Collection Time    08/26/12  9:29 PM      Result Value Range   Troponin I 0.87 (*) <0.30 ng/mL  MRSA PCR SCREENING     Status: Abnormal   Collection Time    08/26/12  9:52 PM      Result Value Range   MRSA by PCR POSITIVE (*) NEGATIVE  CBC     Status: Abnormal   Collection Time    08/26/12 10:05 PM  Result Value Range   WBC 9.2  4.0 - 10.5 K/uL   RBC 3.57 (*) 3.87 - 5.11 MIL/uL   Hemoglobin 9.7 (*) 12.0 - 15.0 g/dL   HCT 19.1 (*) 47.8 - 29.5 %   MCV 83.2  78.0 - 100.0 fL   MCH 27.2  26.0 - 34.0 pg   MCHC 32.7  30.0 - 36.0 g/dL   RDW 62.1 (*) 30.8 - 65.7 %   Platelets 246  150 - 400 K/uL  CREATININE, SERUM     Status: Abnormal   Collection Time    08/26/12 10:05 PM      Result Value Range   Creatinine, Ser 1.25 (*) 0.50 - 1.10 mg/dL   GFR calc non Af Amer 40 (*) >90 mL/min   GFR calc Af Amer 46 (*) >90 mL/min  CBC     Status: Abnormal   Collection Time    08/27/12  4:05 AM      Result Value Range   WBC 9.9  4.0 - 10.5 K/uL   RBC 3.26 (*) 3.87 - 5.11 MIL/uL   Hemoglobin 8.9 (*) 12.0 - 15.0 g/dL   HCT 84.6 (*)  96.2 - 46.0 %   MCV 83.4  78.0 - 100.0 fL   MCH 27.3  26.0 - 34.0 pg   MCHC 32.7  30.0 - 36.0 g/dL   RDW 95.2 (*) 84.1 - 32.4 %   Platelets 228  150 - 400 K/uL  TROPONIN I     Status: Abnormal   Collection Time    08/27/12  4:05 AM      Result Value Range   Troponin I 0.52 (*) <0.30 ng/mL  BASIC METABOLIC PANEL     Status: Abnormal   Collection Time    08/27/12  4:05 AM      Result Value Range   Sodium 139  135 - 145 mEq/L   Potassium 3.9  3.5 - 5.1 mEq/L   Chloride 101  96 - 112 mEq/L   CO2 27  19 - 32 mEq/L   Glucose, Bld 166 (*) 70 - 99 mg/dL   BUN 31 (*) 6 - 23 mg/dL   Creatinine, Ser 4.01 (*) 0.50 - 1.10 mg/dL   Calcium 8.7  8.4 - 02.7 mg/dL   GFR calc non Af Amer 42 (*) >90 mL/min   GFR calc Af Amer 49 (*) >90 mL/min   Radiology:  Dg Chest Port 1 View  08/26/2012   *RADIOLOGY REPORT*  Clinical Data: Chest pain which radiated into both upper extremities earlier today.  Prior history of MI.  PORTABLE CHEST - 1 VIEW  Comparison: Portable chest x-ray 05/20/2012, 02/27/2012 and two- view chest x-ray 09/30/2011.  Findings: Cardiac silhouette upper normal in size for AP portable technique.  Thoracic aorta atherosclerotic, unchanged.  Hilar and mediastinal contours otherwise unremarkable.  Lungs clear. Bronchovascular markings normal.  Pulmonary vascularity normal.  No pneumothorax.  No pleural effusions.  IMPRESSION: Stable borderline heart size.  No acute cardiopulmonary disease.   Original Report Authenticated By: Hulan Saas, M.D.    ASSESSMENT: This 79 year old Caucasian female with multiple cardiac risk factors and known severe three-vessel native coronary artery disease not amenable to any intervention who presented with chest pain and was found to have mildly elevated troponin at to 0.84. this is by definition consistent with non-ST elevation myocardial infarction.  Unfortunately patient has history of multiple gastric ulcers therefore triple therapy at this point could be  quite risky. Her vital signs will  likely not allowed to aggressive up titration of beta blockers and nitrates.   Recommendations: Continue baby aspirin daily start heparin drip and continue until chest pain-free but minimum of  48 hours , prefer this over Lovenox in case of any bleeding complications develop. Will not add tienopiridine e.g.  Plavix as of now giving concern for GI bleeding just 3 months ago May consider addition of Ranexa, would start with 500 mg twice a day May consider increasing the dose of nitroglycerin patch, current dose is 0.4 mg / hour. However it doesn't seem that the patient is doing sufficient nitrates free interval. At best she removes the patch for hour hour and a half.  Patient will not likely benefit from any additional ischemic evaluation at this time, as she has known severe three-vessel disease   Nolon Nations, MD 08/27/2012 5:31 AM

## 2012-08-27 NOTE — Progress Notes (Signed)
Patient ID: Julia Greene  female  ZOX:[id number]    DOB: 10/29/1930    DOA: 08/26/2012  PCP: Oneal Grout, MD  Assessment/Plan: Principal Problem: Chest pain - resolved, NSTEMI, with known history of three-vessel coronary disease. - Appreciate cardiology following, on aspirin, beta blocker, heparin drip for 48 hours - Follow serial cardiac enzymes, trending down - Started on Ranexa per cardiology recommendations  Hypertension  Stable. Continue home antihypertensive medications.   Diabetes  - Continue to be diet controlled. Last hemoglobin A1c 7.0 in June of 2014. Patient has an insulin allergy, not started on any sliding scale insulin. Continue to monitor. Diabetic diet. Has been on metformin in the past, currently not listed on her medication list.   Right toe osteomyelitis  Completed course of antibiotics, no acute infection at this time.   Hypothyroidism  Continue levothyroxin.   Carotid artery stenosis  Stable.   History of diverticulitis of the colon status post colostomy  Stable.   Anemia  Recent history of upper GI bleed status post transfusion in April of 2014, EGD showed multiple gastric ulcers. Continue PPI.   GERD  Continue PPI.   Acute renal insufficiency Improving   Prophylaxis  Lovenox.   CODE STATUS  Full code.   Disposition:    Subjective: Chest pain resolved, feeling better   Objective: Weight change:   Intake/Output Summary (Last 24 hours) at 08/27/12 1129 Last data filed at 08/27/12 0829  Gross per 24 hour  Intake    360 ml  Output      0 ml  Net    360 ml   Blood pressure 105/56, pulse 69, temperature 98 F (36.7 C), temperature source Oral, resp. rate 18, height 5\' 2"  (1.575 m), weight 58.87 kg (129 lb 12.6 oz), SpO2 99.00%.  Physical Exam: General: Alert and awake, oriented x3, not in any acute distress. CVS: S1-S2 clear, no murmur rubs or gallops Chest: clear to auscultation bilaterally, no wheezing, rales or rhonchi Abdomen:  soft nontender, nondistended, normal bowel sounds  Extremities: no cyanosis, clubbing or edema noted bilaterally   Lab Results: Basic Metabolic Panel:  Recent Labs Lab 08/26/12 1559 08/26/12 1613 08/26/12 2205 08/27/12 0405  NA 140 140  --  139  K 4.3 4.3  --  3.9  CL 102 105  --  101  CO2 26  --   --  27  GLUCOSE 158* 160*  --  166*  BUN 37* 35*  --  31*  CREATININE 1.33* 1.50* 1.25* 1.18*  CALCIUM 9.1  --   --  8.7   CBC:  Recent Labs Lab 08/26/12 1559  08/26/12 2205 08/27/12 0405  WBC 8.4  --  9.2 9.9  NEUTROABS 4.9  --   --   --   HGB 9.3*  < > 9.7* 8.9*  HCT 28.4*  < > 29.7* 27.2*  MCV 83.3  --  83.2 83.4  PLT 263  --  246 228  < > = values in this interval not displayed. Cardiac Enzymes:  Recent Labs Lab 08/26/12 2129 08/27/12 0405 08/27/12 0905  TROPONINI 0.87* 0.52* <0.30   BNP: No components found with this basename: POCBNP,  CBG:  Recent Labs Lab 08/26/12 2105  GLUCAP 88     Micro Results: Recent Results (from the past 240 hour(s))  MRSA PCR SCREENING     Status: Abnormal   Collection Time    08/26/12  9:52 PM      Result Value Range Status  MRSA by PCR POSITIVE (*) NEGATIVE Final   Comment:            The GeneXpert MRSA Assay (FDA     approved for NASAL specimens     only), is one component of a     comprehensive MRSA colonization     surveillance program. It is not     intended to diagnose MRSA     infection nor to guide or     monitor treatment for     MRSA infections.     RESULT CALLED TO, READ BACK BY AND VERIFIED WITH:     C.RALPH RN AT 0103 08/27/12 L.PITT    Studies/Results: Dg Chest Port 1 View  08/26/2012   *RADIOLOGY REPORT*  Clinical Data: Chest pain which radiated into both upper extremities earlier today.  Prior history of MI.  PORTABLE CHEST - 1 VIEW  Comparison: Portable chest x-ray 05/20/2012, 02/27/2012 and two- view chest x-ray 09/30/2011.  Findings: Cardiac silhouette upper normal in size for AP portable  technique.  Thoracic aorta atherosclerotic, unchanged.  Hilar and mediastinal contours otherwise unremarkable.  Lungs clear. Bronchovascular markings normal.  Pulmonary vascularity normal.  No pneumothorax.  No pleural effusions.  IMPRESSION: Stable borderline heart size.  No acute cardiopulmonary disease.   Original Report Authenticated By: Hulan Saas, M.D.    Medications: Scheduled Meds: . aspirin EC  81 mg Oral Daily  . Chlorhexidine Gluconate Cloth  6 each Topical Q0600  . ezetimibe  10 mg Oral Daily  . levothyroxine  25 mcg Oral QAC breakfast  . metoprolol  25 mg Oral BID  . multivitamin with minerals  1 tablet Oral Daily  . mupirocin ointment  1 application Nasal BID  . pantoprazole  40 mg Oral Daily  . ranolazine  500 mg Oral BID  . saccharomyces boulardii  250 mg Oral BID  . sodium chloride  3 mL Intravenous Q12H      LOS: 1 day   RAI,RIPUDEEP M.D. [facility] 08/27/2012, 11:29 AM Pager: [phone]  If 7PM-7AM, please contact night-coverage [email or web address] Password [email or web address]

## 2012-08-27 NOTE — Progress Notes (Signed)
ANTICOAGULATION CONSULT NOTE - Follow Up Consult  Pharmacy Consult for heparin Indication: NSTEMI  Allergies  Allergen Reactions  . Aspirin Other (See Comments)    High doses of aspirin puts patient into COMA; currently taking low dose aspirin  . Benzoin Other (See Comments)    Puts patient into COMA  . Cephalexin Rash and Other (See Comments)    Puts patient into COMA  . Cephalosporins Other (See Comments)    Puts patient into COMA  . Codeine Swelling and Rash    CANNOT TAKE Injectable. Can take PO form  . Contrast Media (Iodinated Diagnostic Agents) Other (See Comments)    Causes SYNCOPE / HYPOTENSION  . Demerol Other (See Comments)    Puts patient into COMA  . Dilaudid (Hydromorphone Hcl) Other (See Comments)    Puts patient into COMA  . Insulins Other (See Comments)    "reacted violently to medications" unsure about what that means  . Morphine And Related Other (See Comments)    Puts patient into COMA  . Other Other (See Comments)    Puts patient into COMA.Patient highly sensitive to antibiotics, narcotics and any medication used to put patient to sleep for surgery.  . Penicillins Other (See Comments)    Puts patient into COMA  . Sulfa Antibiotics Other (See Comments)    Puts patient into COMA  . Valium (Diazepam) Other (See Comments)    VERY CRAZY FEELINGS AND ACTIONS, CLIMBING THE WALLS TYPE REACTION  . Vistaril (Hydroxyzine Hcl) Other (See Comments)    CAUSES PATIENT  TO FEEL LIKE "CLIMBING THE WALLS TYPE OF REACTION  . Carafate (Sucralfate) Nausea And Vomiting  . Latex Itching and Rash  . Lisinopril Nausea And Vomiting and Cough    Patient Measurements: Height: 5\' 2"  (157.5 cm) Weight: 129 lb 12.6 oz (58.87 kg) IBW/kg (Calculated) : 50.1 Heparin Dosing Weight: 58.9kg  Vital Signs: Temp: 98.4 F (36.9 C) (08/02 1400) Temp src: Oral (08/02 1400) BP: 122/61 mmHg (08/02 1400) Pulse Rate: 64 (08/02 1400)  Labs:  Recent Labs  08/26/12 1559 08/26/12 1613   08/26/12 2205 08/27/12 0405 08/27/12 0840 08/27/12 0905 08/27/12 1429 08/27/12 1741  HGB 9.3* 9.9*  --  9.7* 8.9*  --   --   --   --   HCT 28.4* 29.0*  --  29.7* 27.2*  --   --   --   --   PLT 263  --   --  246 228  --   --   --   --   HEPARINUNFRC  --   --   --   --   --  0.70  --   --  0.48  CREATININE 1.33* 1.50*  --  1.25* 1.18*  --   --   --   --   TROPONINI <0.30  --   < >  --  0.52*  --  <0.30 <0.30  --   < > = values in this interval not displayed.  Estimated Creatinine Clearance: 30.1 ml/min (by C-G formula based on Cr of 1.18).   Assessment: 49 YOF admitted with chest pain which is now resolved, to continue on heparin for 48 hours per Dr. Fransico Him note from earlier today. First level was at high end of normal and rate was decreased to 700units/hr. Recheck was therapeutic at 0.48. No bleeding noted.  Goal of Therapy:  Heparin level 0.3-0.7 units/ml Monitor platelets by anticoagulation protocol: Yes   Plan:  1. Continue heparin at rate of  700units/hr 2. Daily heparin level and CBC 3. Follow up for long term plans and s/s bleeding  Layla Gramm D. Oletta Buehring, PharmD Clinical Pharmacist Pager: [phone] 08/27/2012 7:35 PM

## 2012-08-27 NOTE — Progress Notes (Signed)
Md made aware of positive troponin of 0.87. New orders to start IV hep. Will continue to monitor the pt. Sanda Linger, RN

## 2012-08-27 NOTE — Progress Notes (Signed)
ANTICOAGULATION CONSULT NOTE - Initial Consult  Pharmacy Consult for heparin Indication: NSTEMI  Allergies  Allergen Reactions  . Aspirin Other (See Comments)    High doses of aspirin puts patient into COMA; currently taking low dose aspirin  . Benzoin Other (See Comments)    Puts patient into COMA  . Cephalexin Rash and Other (See Comments)    Puts patient into COMA  . Cephalosporins Other (See Comments)    Puts patient into COMA  . Codeine Swelling and Rash    CANNOT TAKE Injectable. Can take PO form  . Contrast Media (Iodinated Diagnostic Agents) Other (See Comments)    Causes SYNCOPE / HYPOTENSION  . Demerol Other (See Comments)    Puts patient into COMA  . Dilaudid (Hydromorphone Hcl) Other (See Comments)    Puts patient into COMA  . Insulins Other (See Comments)    "reacted violently to medications" unsure about what that means  . Morphine And Related Other (See Comments)    Puts patient into COMA  . Other Other (See Comments)    Puts patient into COMA.Patient highly sensitive to antibiotics, narcotics and any medication used to put patient to sleep for surgery.  . Penicillins Other (See Comments)    Puts patient into COMA  . Sulfa Antibiotics Other (See Comments)    Puts patient into COMA  . Valium (Diazepam) Other (See Comments)    VERY CRAZY FEELINGS AND ACTIONS, CLIMBING THE WALLS TYPE REACTION  . Vistaril (Hydroxyzine Hcl) Other (See Comments)    CAUSES PATIENT  TO FEEL LIKE "CLIMBING THE WALLS TYPE OF REACTION  . Carafate (Sucralfate) Nausea And Vomiting  . Latex Itching and Rash  . Lisinopril Nausea And Vomiting and Cough    Patient Measurements: Height: 5\' 2"  (157.5 cm) Weight: 129 lb 12.8 oz (58.877 kg) IBW/kg (Calculated) : 50.1  Vital Signs: Temp: 98.5 F (36.9 C) (08/01 2056) Temp src: Oral (08/01 2056) BP: 127/87 mmHg (08/01 2056) Pulse Rate: 65 (08/01 2056)  Labs:  Recent Labs  08/26/12 1559 08/26/12 1613 08/26/12 2129 08/26/12 2205   HGB 9.3* 9.9*  --  9.7*  HCT 28.4* 29.0*  --  29.7*  PLT 263  --   --  246  CREATININE 1.33* 1.50*  --  1.25*  TROPONINI <0.30  --  0.87*  --     Estimated Creatinine Clearance: 28.4 ml/min (by C-G formula based on Cr of 1.25).   Medical History: Past Medical History  Diagnosis Date  . Coronary artery disease     a. h/o NSTEMI 2012 in setting of AFib/perf'd divertic (in [place]. - coded, CPR, intubation during hospital stay);  b.  MV 2/13: EF 57%, mod isch septum and poss small part of the Ant wall;  c.  LHC 04/06/11: diffuse 3v CAD no high grade lesions amenable to PCI, surgery would be incomplete thus med rx recommended. d. NSTEMI 02/2012 managed medically.  . Atrial fibrillation     in setting of sepsis in [place]; tx with amiodarone (amio stopped 04/20/11);    Marland Kitchen Injury to liver   . Allergic rhinitis due to pollen   . Hyperlipidemia   . Hypertension   . Vitamin D deficiency   . Anemia, unspecified   . Diabetes mellitus     borderline  . Diverticulitis of colon with perforation s/p colectomy/ostomy 13Oct2012 02/23/2011    s/p colostomy  . Carotid stenosis     a.  02/2011 - RICA 80-99% and LICA 60-79%;  b. s/p R  CEA Dr. Darrick Penna 09/2011  . LV dysfunction     a.  Echo 10/2010 (in setting of NSTEMI at [facility] in [facility]):  mid ot apical septal AK, EF 40-45%, MAC, mild to mod MR, mild TR, no pulmo HTN;  b. Echo 02/2012: EF 45-50% with WMA, mild-mod MR.  Marland Kitchen PAD (peripheral artery disease)     a. ABIs 9/13: R 0.55; L 0.93;  b. arterial insufficiency ulcers on right foot;  followed by [facility];  c. angiogram 01/2012 with severe bilat SFA and pop disease => not good bypass candidate and PTA not an option => prob continue conservative mgmt  . Hypothyroid   . Fall at home Feb. 6, 2014    Left leg   . Complication of anesthesia     Allergie to narcotics and abx.  . Family history of anesthesia complication     "mother had allergies too" (05/20/2012)  . Anginal pain   . Pneumonia     "a few times;  always associated w/surgeries" (05/20/2012)  . GERD (gastroesophageal reflux disease)   . Gastric ulcer     "just found these today" (05/20/2012)  . Arthritis     "legs, arm, bones; Chelation therapy cleared all that" (05/20/2012)  . History of blood transfusion     "many" (05/20/2012)  . Toe ulcer, right     5th toe; due to PVD/notes 05/20/2012  . Myocardial infarction ~ 1952; Feb. 1, 2014    "I've had a total of 8 as of 02/2012" (05/20/2012)  . NSTEMI (non-ST elevated myocardial infarction) 05/20/2012    [name] 05/20/2012  . Colostomy in place     Medications:  Prescriptions prior to admission  Medication Sig Dispense Refill  . acetaminophen-codeine (TYLENOL #3) 300-30 MG per tablet Take 1-2 tablets by mouth every 4 (four) hours as needed for pain.      Marland Kitchen aspirin EC 81 MG tablet Take 81 mg by mouth daily.       . DiphenhydrAMINE HCl (BENADRYL PO) Take 30 mLs by mouth as needed. For cough      . ezetimibe (ZETIA) 10 MG tablet Take 1 tablet (10 mg total) by mouth daily.  30 tablet  11  . furosemide (LASIX) 20 MG tablet Take 1 tablet (20 mg total) by mouth daily.  30 tablet  3  . levothyroxine (SYNTHROID, LEVOTHROID) 25 MCG tablet Take 25 mcg by mouth daily.      . Melatonin 5 MG CAPS Take 10 mg by mouth at bedtime. To help sleep      . metoprolol (LOPRESSOR) 50 MG tablet Take 0.5 tablets (25 mg total) by mouth 2 (two) times daily.  60 tablet  6  . Multiple Vitamins-Minerals (ADULT GUMMY PO) Take 2 tablets by mouth daily.      . naproxen sodium (ANAPROX) 220 MG tablet Take 220 mg by mouth 2 (two) times daily with a meal.      . nitroGLYCERIN (NITROSTAT) 0.4 MG SL tablet Place 0.4 mg under the tongue every 5 (five) minutes as needed for chest pain.      . pantoprazole (PROTONIX) 40 MG tablet Take 40 mg by mouth daily.      Marland Kitchen saccharomyces boulardii (FLORASTOR) 250 MG capsule Take 1 capsule (250 mg total) by mouth 2 (two) times daily.  60 capsule  0  . doxycycline (VIBRAMYCIN) 50 MG capsule Take  2 capsules (100 mg total) by mouth 2 (two) times daily.  28 capsule  0   Scheduled:  .  aspirin EC  81 mg Oral Daily  . ezetimibe  10 mg Oral Daily  . heparin  3,000 Units Intravenous Once  . levothyroxine  25 mcg Oral QAC breakfast  . metoprolol  25 mg Oral BID  . multivitamin with minerals  1 tablet Oral Daily  . pantoprazole  40 mg Oral Daily  . saccharomyces boulardii  250 mg Oral BID  . sodium chloride  3 mL Intravenous Q12H   Infusions:  . heparin      Assessment: 79yo female c/o sudden onset of chest pressure radiating to arms and jaw, initial troponin negative, now elevated, to begin heparin; cards aware of anemia.  Goal of Therapy:  Heparin level 0.3-0.7 units/ml Monitor platelets by anticoagulation protocol: Yes   Plan:  Will give heparin 3000 units IV bolus x1 followed by gtt at 750 units/hr (was previously therapeutic at this rate) and monitor heparin levels and CBC.  Vernard Gambles, PharmD, BCPS  08/27/2012,1:16 AM

## 2012-08-27 NOTE — Progress Notes (Signed)
See Dr. Elliot Gurney note from earlier this am.  She is feeling better.  Troponin levels are mildly elevated but are trending downward.   We will try her on Ranexa .  Vesta Mixer, Montez Hageman., MD, [place] 08/27/2012, 9:42 AM Office - [phone] Pager [phone]

## 2012-08-28 ENCOUNTER — Inpatient Hospital Stay (HOSPITAL_COMMUNITY): Payer: Medicare Other

## 2012-08-28 DIAGNOSIS — I10 Essential (primary) hypertension: Secondary | ICD-10-CM

## 2012-08-28 DIAGNOSIS — R079 Chest pain, unspecified: Secondary | ICD-10-CM

## 2012-08-28 DIAGNOSIS — I2510 Atherosclerotic heart disease of native coronary artery without angina pectoris: Secondary | ICD-10-CM

## 2012-08-28 DIAGNOSIS — I219 Acute myocardial infarction, unspecified: Secondary | ICD-10-CM

## 2012-08-28 DIAGNOSIS — E119 Type 2 diabetes mellitus without complications: Secondary | ICD-10-CM

## 2012-08-28 LAB — CBC
HCT: 28.5 % — ABNORMAL LOW (ref 36.0–46.0)
MCHC: 33.7 g/dL (ref 30.0–36.0)
MCV: 83.3 fL (ref 78.0–100.0)
RDW: 17.7 % — ABNORMAL HIGH (ref 11.5–15.5)

## 2012-08-28 LAB — BASIC METABOLIC PANEL
BUN: 18 mg/dL (ref 6–23)
Calcium: 9 mg/dL (ref 8.4–10.5)
Creatinine, Ser: 0.94 mg/dL (ref 0.50–1.10)
GFR calc Af Amer: 65 mL/min — ABNORMAL LOW (ref >90)
GFR calc non Af Amer: 56 mL/min — ABNORMAL LOW (ref >90)

## 2012-08-28 IMAGING — CR DG CHEST 2V
2 series · 2 of 2 positions shown · non-contrast
Comparison: [DATE]

CLINICAL DATA: Chest pain

CHEST - 2 VIEW

[w chest pa]
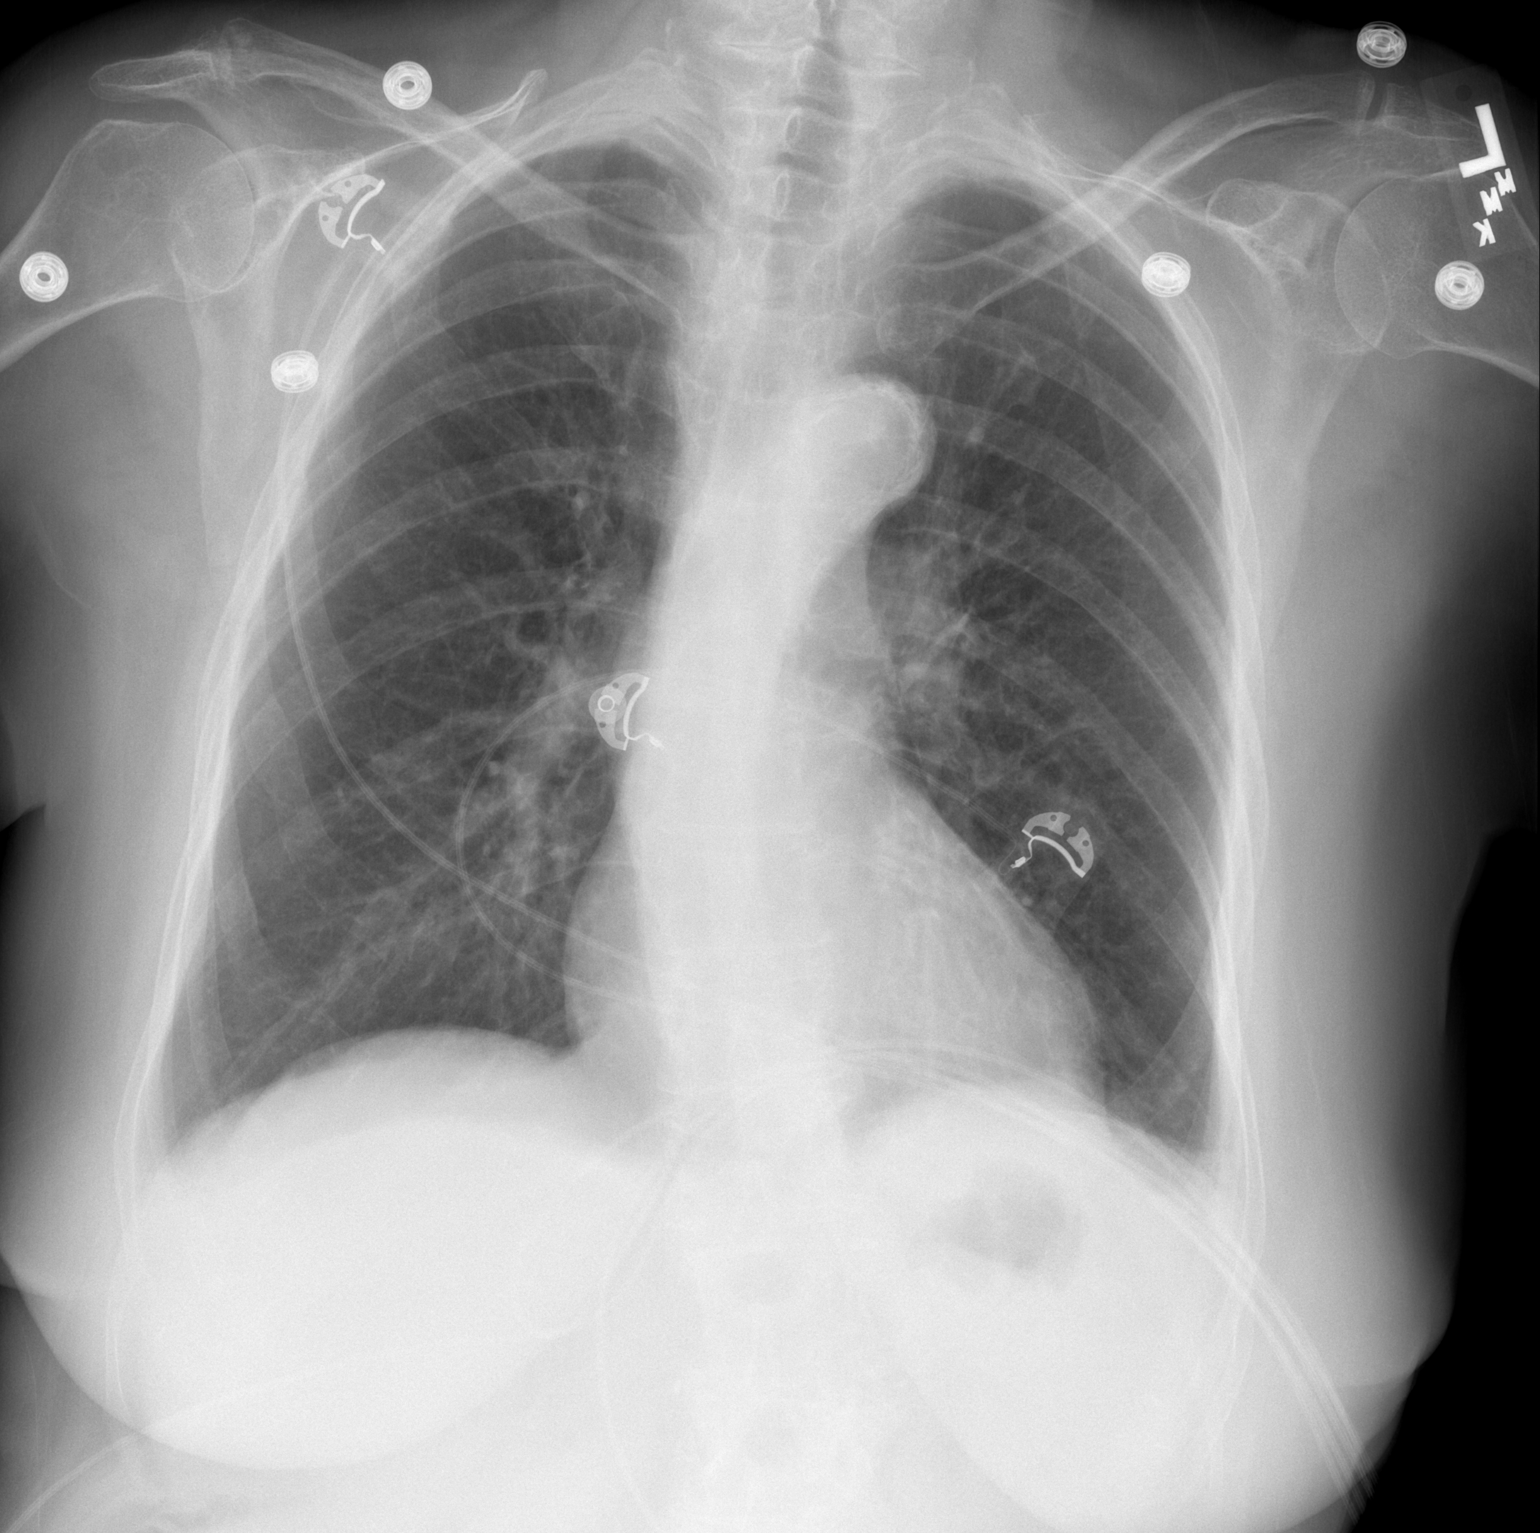

[w chest lat]
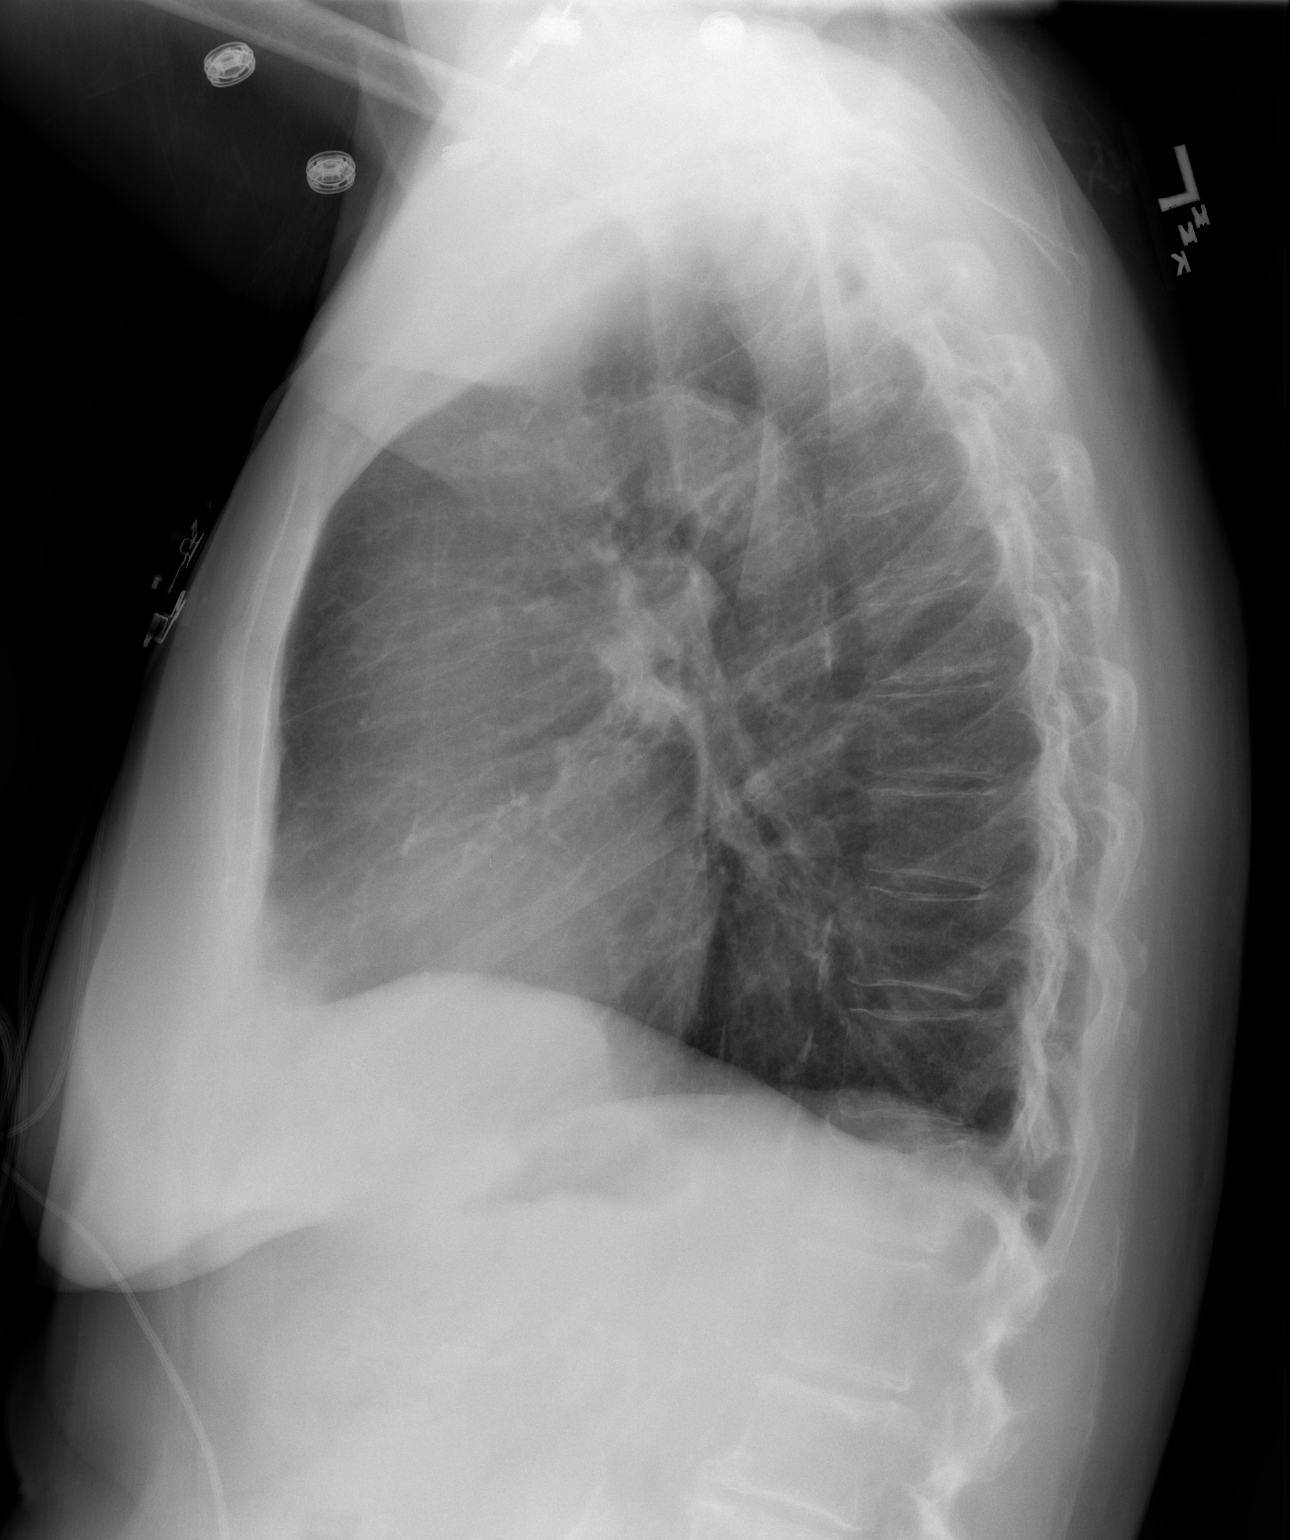

[2 of 2 positions shown; findings below may reference images not displayed]

FINDINGS: Increased interstitial markings.  No frank interstitial
edema.  No pleural effusion or pneumothorax.

The heart is normal in size.

Mild superior endplate changes involving a lower thoracic vertebral
body, likely T12, unchanged from [EW].
IMPRESSION: No evidence of acute cardiopulmonary disease.

## 2012-08-28 MED ORDER — NITROGLYCERIN 0.4 MG/HR TD PT24
0.4000 mg | MEDICATED_PATCH | Freq: Every day | TRANSDERMAL | Status: DC
  Administered 2012-08-28 – 2012-08-29 (×2): 0.4 mg via TRANSDERMAL
  Filled 2012-08-28 (×2): qty 1

## 2012-08-28 MED ORDER — TECHNETIUM TC 99M DIETHYLENETRIAME-PENTAACETIC ACID: 40.0000 | Freq: Once | INTRAVENOUS | Status: AC | PRN

## 2012-08-28 MED ORDER — TECHNETIUM TO 99M ALBUMIN AGGREGATED
6.0000 | Freq: Once | INTRAVENOUS | Status: AC | PRN
  Administered 2012-08-28: 6 via INTRAVENOUS

## 2012-08-28 NOTE — Progress Notes (Signed)
Patient ID: Julia Greene  female  FAO:[id number]    DOB: 1931-01-12    DOA: 08/26/2012  PCP: Oneal Grout, MD  Assessment/Plan: Principal Problem: Chest pain - resolved, NSTEMI, with known history of three-vessel coronary disease. - Appreciate cardiology following, on aspirin, beta blocker, heparin drip for 48 hours - Follow serial cardiac enzymes, trending down to negative  - Started on Ranexa per cardiology recommendations, and placed on nitroglycerin patch - Patient reported this morning that she had a recent air flight to [facility] and back, ordered a d-dimer, elevated at 2.22, will check CT angiogram of the chest to rule out PE. Currently on heparin drip.  Hypertension  Stable. Continue home antihypertensive medications.   Diabetes  - Continue to be diet controlled. Last hemoglobin A1c 7.0 in June of 2014. Patient has an insulin allergy, not started on any sliding scale insulin. Continue to monitor. Diabetic diet. Has been on metformin in the past, currently not listed on her medication list.   Right toe osteomyelitis  Completed course of antibiotics, no acute infection at this time.   Hypothyroidism  Continue levothyroxin.   Carotid artery stenosis  Stable.   History of diverticulitis of the colon status post colostomy  Stable.   Anemia  Recent history of upper GI bleed status post transfusion in April of 2014, EGD showed multiple gastric ulcers. Continue PPI.   GERD  Continue PPI.   Acute renal insufficiency resolved  Prophylaxis  On heparin drip  CODE STATUS  Full code.   Disposition:    Subjective: Chest pain has resolved, feeling better. No shortness of breath or peripheral edema   Objective: Weight change:   Intake/Output Summary (Last 24 hours) at 08/28/12 1114 Last data filed at 08/28/12 0903  Gross per 24 hour  Intake    600 ml  Output      0 ml  Net    600 ml   Blood pressure 134/64, pulse 62, temperature 98 F (36.7 C), temperature source  Oral, resp. rate 16, height 5\' 2"  (1.575 m), weight 58.87 kg (129 lb 12.6 oz), SpO2 98.00%.  Physical Exam: General: Alert and awake, oriented x3, NAD, pleasant and cooperative. CVS: S1-S2 clear, no murmur rubs or gallops Chest: CTAB Abdomen: soft NT,ND, NBS Extremities: no c/c/e biaterally   Lab Results: Basic Metabolic Panel:  Recent Labs Lab 08/27/12 0405 08/28/12 0415  NA 139 137  K 3.9 3.9  CL 101 104  CO2 27 24  GLUCOSE 166* 125*  BUN 31* 18  CREATININE 1.18* 0.94  CALCIUM 8.7 9.0   CBC:  Recent Labs Lab 08/26/12 1559  08/27/12 0405 08/28/12 0415  WBC 8.4  < > 9.9 9.2  NEUTROABS 4.9  --   --   --   HGB 9.3*  < > 8.9* 9.6*  HCT 28.4*  < > 27.2* 28.5*  MCV 83.3  < > 83.4 83.3  PLT 263  < > 228 247  < > = values in this interval not displayed. Cardiac Enzymes:  Recent Labs Lab 08/27/12 0905 08/27/12 1429 08/27/12 2032  TROPONINI <0.30 <0.30 <0.30   BNP: No components found with this basename: POCBNP,  CBG:  Recent Labs Lab 08/26/12 2105  GLUCAP 88     Micro Results: Recent Results (from the past 240 hour(s))  MRSA PCR SCREENING     Status: Abnormal   Collection Time    08/26/12  9:52 PM      Result Value Range Status   MRSA by  PCR POSITIVE (*) NEGATIVE Final   Comment:            The GeneXpert MRSA Assay (FDA     approved for NASAL specimens     only), is one component of a     comprehensive MRSA colonization     surveillance program. It is not     intended to diagnose MRSA     infection nor to guide or     monitor treatment for     MRSA infections.     RESULT CALLED TO, READ BACK BY AND VERIFIED WITH:     C.RALPH RN AT 0103 08/27/12 L.PITT    Studies/Results: Dg Chest Port 1 View  08/26/2012   *RADIOLOGY REPORT*  Clinical Data: Chest pain which radiated into both upper extremities earlier today.  Prior history of MI.  PORTABLE CHEST - 1 VIEW  Comparison: Portable chest x-ray 05/20/2012, 02/27/2012 and two- view chest x-ray  09/30/2011.  Findings: Cardiac silhouette upper normal in size for AP portable technique.  Thoracic aorta atherosclerotic, unchanged.  Hilar and mediastinal contours otherwise unremarkable.  Lungs clear. Bronchovascular markings normal.  Pulmonary vascularity normal.  No pneumothorax.  No pleural effusions.  IMPRESSION: Stable borderline heart size.  No acute cardiopulmonary disease.   Original Report Authenticated By: Hulan Saas, M.D.    Medications: Scheduled Meds: . aspirin EC  81 mg Oral Daily  . Chlorhexidine Gluconate Cloth  6 each Topical Q0600  . ezetimibe  10 mg Oral Daily  . levothyroxine  25 mcg Oral QAC breakfast  . metoprolol  25 mg Oral BID  . multivitamin with minerals  1 tablet Oral Daily  . mupirocin ointment  1 application Nasal BID  . nitroGLYCERIN  0.4 mg Transdermal Daily  . pantoprazole  40 mg Oral Daily  . ranolazine  500 mg Oral BID  . saccharomyces boulardii  250 mg Oral BID  . sodium chloride  3 mL Intravenous Q12H      LOS: 2 days   Asah Lamay M.D. [facility] 08/28/2012, 11:14 AM Pager: [phone]  If 7PM-7AM, please contact night-coverage [email or web address] Password [email or web address]

## 2012-08-28 NOTE — Progress Notes (Signed)
ANTICOAGULATION CONSULT NOTE - Follow Up Consult  Pharmacy Consult for heparin Indication: NSTEMI  Allergies  Allergen Reactions  . Aspirin Other (See Comments)    High doses of aspirin puts patient into COMA; currently taking low dose aspirin  . Benzoin Other (See Comments)    Puts patient into COMA  . Cephalexin Rash and Other (See Comments)    Puts patient into COMA  . Cephalosporins Other (See Comments)    Puts patient into COMA  . Codeine Swelling and Rash    CANNOT TAKE Injectable. Can take PO form  . Contrast Media (Iodinated Diagnostic Agents) Other (See Comments)    Causes SYNCOPE / HYPOTENSION  . Demerol Other (See Comments)    Puts patient into COMA  . Dilaudid (Hydromorphone Hcl) Other (See Comments)    Puts patient into COMA  . Insulins Other (See Comments)    "reacted violently to medications" unsure about what that means  . Morphine And Related Other (See Comments)    Puts patient into COMA  . Other Other (See Comments)    Puts patient into COMA.Patient highly sensitive to antibiotics, narcotics and any medication used to put patient to sleep for surgery.  . Penicillins Other (See Comments)    Puts patient into COMA  . Sulfa Antibiotics Other (See Comments)    Puts patient into COMA  . Valium (Diazepam) Other (See Comments)    VERY CRAZY FEELINGS AND ACTIONS, CLIMBING THE WALLS TYPE REACTION  . Vistaril (Hydroxyzine Hcl) Other (See Comments)    CAUSES PATIENT  TO FEEL LIKE "CLIMBING THE WALLS TYPE OF REACTION  . Carafate (Sucralfate) Nausea And Vomiting  . Latex Itching and Rash  . Lisinopril Nausea And Vomiting and Cough    Patient Measurements: Height: 5\' 2"  (157.5 cm) Weight: 129 lb 12.6 oz (58.87 kg) IBW/kg (Calculated) : 50.1 Heparin Dosing Weight: 58.9kg  Vital Signs: Temp: 98 F (36.7 C) (08/03 0444) BP: 134/64 mmHg (08/03 0444) Pulse Rate: 62 (08/03 0444)  Labs:  Recent Labs  08/26/12 2205 08/27/12 0405 08/27/12 0840 08/27/12 0905  08/27/12 1429 08/27/12 1741 08/27/12 2032 08/28/12 0415  HGB 9.7* 8.9*  --   --   --   --   --  9.6*  HCT 29.7* 27.2*  --   --   --   --   --  28.5*  PLT 246 228  --   --   --   --   --  247  HEPARINUNFRC  --   --  0.70  --   --  0.48  --  0.31  CREATININE 1.25* 1.18*  --   --   --   --   --  0.94  TROPONINI  --  0.52*  --  <0.30 <0.30  --  <0.30  --     Estimated Creatinine Clearance: 37.8 ml/min (by C-G formula based on Cr of 0.94).   Assessment: 72 YOF admitted with chest pain which is now resolved, to continue on heparin for 48 hours per cardiology.  Heparin level at goal at low end.  No bleeding noted.   Goal of Therapy:  Heparin level 0.3-0.7 units/ml Monitor platelets by anticoagulation protocol: Yes   Plan:  Increase heparin to 750units/hr Daily heparin level and CBC  Wendie Simmer, PharmD, BCPS Clinical Pharmacist  Pager: [phone]

## 2012-08-28 NOTE — Progress Notes (Signed)
Referring Physician: Thad Ranger MD Primary Physician: Oneal Grout, MD Primary Cardiologist: Rollene Rotunda, MD Reason for Consultation: CP and elevated troponin - recommendations regarding further management.  HPI: Julia Greene is a 79 y.o. Caucasian female with history of coronary artery disease, hypertension, hyperlipidemia, diabetes, carotid stenosis, LV dysfunction with EF of 40-45%, hypothyroidism, GERD, peripheral artery disease, carotid stenosis, who presents with the complaints of chest pain.   around 1130 a.m. 8/1  while she was preparing breakfast, she had sudden onset of chest pressure that was sharp and severe with radiating down her both arms, with some radiation to her left jaw. The pain was similar to her previous heart attacks. She took 3 sublingual nitroglycerin tablets without relief. She called her son around to 2:20 p.m., given she was still having pain he called EMS. Patient herself thinks that recent increased level of stress because her son was trying to arrange for her to be placed to assisted living facility could have contributed to the chest pain. Patient received few more nitroglycerin on the way to the emergency department, she was chest pain-free by 430 p.m.  patient was admitted to hospitalist service for further workup. Cardiology consultation was requested once troponin turned out to be positive.  [facility] 04/[facility]/2011   Left mainstem: Heavy calcification with distal 40% stenosis  Left anterior descending (LAD): Heavy proximal calcification with ostial 70% stenosis followed by long 50% stenosis followed by occlusion after a moderate size first diagonal. The diagonal has ostial 95% stenosis with proximal and mid diffuse 60% stenosis.  Left circumflex (LCx): The circumflex appears to be a large vessel. It is occluded in the AV groove after the first obtuse marginal. The first obtuse marginal is a small branching vessel. There is ostial 90% stenosis. The superior branch  is patent but small. The inferior branch appears to be large but is occluded. There is an extensive network of occluded marginals and posterior laterals after this that fill to some degree by collaterals.  Ramus intermedius (RI): This is a large vessel. There is moderate proximal calcification. There is a long 60% stenosis in the proximal and mid segment. The remainder of the vessel is free of high-grade disease.  Right coronary artery (RCA): Long proximal 40-50% stenosis followed by occlusion after a moderate-sized acute marginal branch. The acute marginal has long 80% ostial stenosis.  Left ventriculography: Left ventricular systolic function is normal, LVEF is estimated at 55-65%, LVEDP=23 mm Hg, there is moderate mitral regurgitation  Final Conclusions: Severe diffuse three-vessel coronary artery disease. Well preserved ejection fraction.  Recommendations: The patient's coronary disease is long-standing severe diffuse and extensively collateralized. At this point there are no high grade lesions amenable to percutaneous revascularization. Any attempts at surgical revascularization would be very incomplete. Given this medical management and continued attempts at risk reduction is the preferred therapy.  Review of Systems: 12 systems were reviewed and were negative except mentioned in history of present illness  Past Medical History  Diagnosis Date  . Coronary artery disease     a. h/o NSTEMI 2012 in setting of AFib/perf'd divertic (in [place]. - coded, CPR, intubation during hospital stay);  b.  MV 2/13: EF 57%, mod isch septum and poss small part of the Ant wall;  c.  [facility] 04/06/11: diffuse 3v CAD no high grade lesions amenable to PCI, surgery would be incomplete thus med rx recommended. d. NSTEMI 02/2012 managed medically.  . Atrial fibrillation     in setting of sepsis in [place];  tx with amiodarone (amio stopped 04/20/11);    Marland Kitchen Injury to liver   . Allergic rhinitis due to pollen   . Hyperlipidemia   .  Hypertension   . Vitamin D deficiency   . Anemia, unspecified   . Diabetes mellitus     borderline  . Diverticulitis of colon with perforation s/p colectomy/ostomy 13Oct2012 02/23/2011    s/p colostomy  . Carotid stenosis     a.  02/2011 - RICA 80-99% and LICA 60-79%;  b. s/p R CEA Dr. Darrick Penna 09/2011  . LV dysfunction     a.  Echo 10/2010 (in setting of NSTEMI at [facility] in [facility]):  mid ot apical septal AK, EF 40-45%, MAC, mild to mod MR, mild TR, no pulmo HTN;  b. Echo 02/2012: EF 45-50% with WMA, mild-mod MR.  Marland Kitchen PAD (peripheral artery disease)     a. ABIs 9/13: R 0.55; L 0.93;  b. arterial insufficiency ulcers on right foot;  followed by [facility];  c. angiogram 01/2012 with severe bilat SFA and pop disease => not good bypass candidate and PTA not an option => prob continue conservative mgmt  . Hypothyroid   . Fall at home Feb. 6, 2014    Left leg   . Complication of anesthesia     Allergie to narcotics and abx.  . Family history of anesthesia complication     "mother had allergies too" (05/20/2012)  . Anginal pain   . Pneumonia     "a few times; always associated w/surgeries" (05/20/2012)  . GERD (gastroesophageal reflux disease)   . Gastric ulcer     "just found these today" (05/20/2012)  . Arthritis     "legs, arm, bones; Chelation therapy cleared all that" (05/20/2012)  . History of blood transfusion     "many" (05/20/2012)  . Toe ulcer, right     5th toe; due to PVD/notes 05/20/2012  . Myocardial infarction ~ 1952; Feb. 1, 2014    "I've had a total of 8 as of 02/2012" (05/20/2012)  . NSTEMI (non-ST elevated myocardial infarction) 05/20/2012    [name] 05/20/2012  . Colostomy in place    Past Surgical History  Procedure Laterality Date  . Total hip arthroplasty Right 07/2004  . Colostomy  10/2010    for complex diverticular disease.  not done in [place]  . Cesarean section  1955, 1959, 1960  . Abdominal adhesion surgery  1976    at time of hysterectomy  . Endarterectomy  10/20/2011     Procedure: ENDARTERECTOMY CAROTID;  Surgeon: Sherren Kerns, MD;  Location: [facility] OR;  Service: Vascular;  Laterality: Right;  Right Carotid Endarterectomy with patch angioplasty  . Joint replacement  2006    Right  Hip  . Appendectomy  1943  . Abdominal hysterectomy  1976  . Combined hysterectomy abdominal w/ a&p repair / oophorectomy  1976    "left a piece of one ovary" 94/25/2014)  . Cardiac catheterization    . Colostomy  10/2010  . Cataract extraction w/ intraocular lens  implant, bilateral Bilateral ~ 2013  . Dilation and curettage of uterus  1950's    "3-4 during childbirth time" (05/20/2012)  . Colectomy  10/2009    sigmoid/notes 05/20/2012  . Esophagogastroduodenoscopy N/A 05/20/2012    Procedure: ESOPHAGOGASTRODUODENOSCOPY (EGD);  Surgeon: Louis Meckel, MD;  Location[facility] ENDOSCOPY;  Service: Endoscopy;  Laterality: N/A;     Current Medications: . aspirin EC  81 mg Oral Daily  . Chlorhexidine Gluconate Cloth  6 each Topical Q0600  . ezetimibe  10 mg Oral Daily  . levothyroxine  25 mcg Oral QAC breakfast  . metoprolol  25 mg Oral BID  . multivitamin with minerals  1 tablet Oral Daily  . mupirocin ointment  1 application Nasal BID  . pantoprazole  40 mg Oral Daily  . ranolazine  500 mg Oral BID  . saccharomyces boulardii  250 mg Oral BID  . sodium chloride  3 mL Intravenous Q12H   Infusions:  . heparin 700 Units/hr (08/28/12 0327)    Prescriptions prior to admission  Medication Sig Dispense Refill  . acetaminophen-codeine (TYLENOL #3) 300-30 MG per tablet Take 1-2 tablets by mouth every 4 (four) hours as needed for pain.      Marland Kitchen aspirin EC 81 MG tablet Take 81 mg by mouth daily.       . DiphenhydrAMINE HCl (BENADRYL PO) Take 30 mLs by mouth as needed. For cough      . ezetimibe (ZETIA) 10 MG tablet Take 1 tablet (10 mg total) by mouth daily.  30 tablet  11  . furosemide (LASIX) 20 MG tablet Take 1 tablet (20 mg total) by mouth daily.  30 tablet  3  . levothyroxine  (SYNTHROID, LEVOTHROID) 25 MCG tablet Take 25 mcg by mouth daily.      . Melatonin 5 MG CAPS Take 10 mg by mouth at bedtime. To help sleep      . metoprolol (LOPRESSOR) 50 MG tablet Take 0.5 tablets (25 mg total) by mouth 2 (two) times daily.  60 tablet  6  . Multiple Vitamins-Minerals (ADULT GUMMY PO) Take 2 tablets by mouth daily.      . naproxen sodium (ANAPROX) 220 MG tablet Take 220 mg by mouth 2 (two) times daily with a meal.      . nitroGLYCERIN (NITRODUR - DOSED IN MG/24 HR) 0.4 mg/hr Place 1 patch onto the skin daily.      . nitroGLYCERIN (NITROSTAT) 0.4 MG SL tablet Place 0.4 mg under the tongue every 5 (five) minutes as needed for chest pain.      . pantoprazole (PROTONIX) 40 MG tablet Take 40 mg by mouth daily.      Marland Kitchen saccharomyces boulardii (FLORASTOR) 250 MG capsule Take 1 capsule (250 mg total) by mouth 2 (two) times daily.  60 capsule  0  . doxycycline (VIBRAMYCIN) 50 MG capsule Take 2 capsules (100 mg total) by mouth 2 (two) times daily.  28 capsule  0     Allergies  Allergen Reactions  . Aspirin Other (See Comments)    High doses of aspirin puts patient into COMA; currently taking low dose aspirin  . Benzoin Other (See Comments)    Puts patient into COMA  . Cephalexin Rash and Other (See Comments)    Puts patient into COMA  . Cephalosporins Other (See Comments)    Puts patient into COMA  . Codeine Swelling and Rash    CANNOT TAKE Injectable. Can take PO form  . Contrast Media (Iodinated Diagnostic Agents) Other (See Comments)    Causes SYNCOPE / HYPOTENSION  . Demerol Other (See Comments)    Puts patient into COMA  . Dilaudid (Hydromorphone Hcl) Other (See Comments)    Puts patient into COMA  . Insulins Other (See Comments)    "reacted violently to medications" unsure about what that means  . Morphine And Related Other (See Comments)    Puts patient into COMA  . Other Other (See Comments)  Puts patient into COMA.Patient highly sensitive to antibiotics,  narcotics and any medication used to put patient to sleep for surgery.  . Penicillins Other (See Comments)    Puts patient into COMA  . Sulfa Antibiotics Other (See Comments)    Puts patient into COMA  . Valium (Diazepam) Other (See Comments)    VERY CRAZY FEELINGS AND ACTIONS, CLIMBING THE WALLS TYPE REACTION  . Vistaril (Hydroxyzine Hcl) Other (See Comments)    CAUSES PATIENT  TO FEEL LIKE "CLIMBING THE WALLS TYPE OF REACTION  . Carafate (Sucralfate) Nausea And Vomiting  . Latex Itching and Rash  . Lisinopril Nausea And Vomiting and Cough    History   Social History  . Marital Status: Widowed    Spouse Name: N/A    Number of Children: N/A  . Years of Education: N/A   Occupational History  . Not on file.   Social History Main Topics  . Smoking status: Never Smoker   . Smokeless tobacco: Never Used  . Alcohol Use: No  . Drug Use: No  . Sexually Active: No   Other Topics Concern  . Not on file   Social History Narrative  . No narrative on file    Family History  Problem Relation Age of Onset  . Heart disease Mother 81    Pacemaker, No CAD Heart Disease before age 57  . Diverticulitis Mother   . Diabetes Mother   . Hyperlipidemia Mother   . Hypertension Mother   . Lung disease Father 48    Smoking  . Ovarian cancer Daughter   . Cancer Daughter   . Colon cancer Neg Hx   . Heart disease Maternal Grandmother   . Diabetes Maternal Aunt     x 2  . Diabetes Sister   . Heart disease Sister     Heart Disease before age 49  . Hyperlipidemia Sister   . Hypertension Sister    Family Status  Relation Status Death Age  . Mother Deceased 47    complications from pacemaker  . Father Deceased 42    lung problems  . Daughter Alive     PHYSICAL EXAM: Filed Vitals:   08/28/12 0444  BP: 134/64  Pulse: 62  Temp: 98 F (36.7 C)  Resp: 16     Intake/Output Summary (Last 24 hours) at 08/28/12 0818 Last data filed at 08/27/12 1300  Gross per 24 hour  Intake     720 ml  Output      0 ml  Net    720 ml    General:  Well appearing. No respiratory difficulty HEENT: normal Neck: supple. no JVD. Carotids 2+ bilat; no bruits. No lymphadenopathy or thryomegaly appreciated. Cor: PMI nondisplaced. Regular rate & rhythm. No rubs, gallops or murmurs. Lungs: clear Abdomen:  soft Extremities: no cyanosis, clubbing, rash, edema Neuro: alert & oriented x 3, cranial nerves grossly intact. moves all 4 extremities w/o difficulty. Affect pleasant.  ECG: ECHO: Results for orders placed during the hospital encounter of 08/26/12 (from the past 24 hour(s))  HEPARIN LEVEL (UNFRACTIONATED)     Status: None   Collection Time    08/27/12  8:40 AM      Result Value Range   Heparin Unfractionated 0.70  0.30 - 0.70 IU/mL  TYPE AND SCREEN     Status: None   Collection Time    08/27/12  8:40 AM      Result Value Range   ABO/RH(D) O POS  Antibody Screen NEG     Sample Expiration 08/30/2012    TROPONIN I     Status: None   Collection Time    08/27/12  9:05 AM      Result Value Range   Troponin I <0.30  <0.30 ng/mL  TROPONIN I     Status: None   Collection Time    08/27/12  2:29 PM      Result Value Range   Troponin I <0.30  <0.30 ng/mL  HEPARIN LEVEL (UNFRACTIONATED)     Status: None   Collection Time    08/27/12  5:41 PM      Result Value Range   Heparin Unfractionated 0.48  0.30 - 0.70 IU/mL  TROPONIN I     Status: None   Collection Time    08/27/12  8:32 PM      Result Value Range   Troponin I <0.30  <0.30 ng/mL  HEPARIN LEVEL (UNFRACTIONATED)     Status: None   Collection Time    08/28/12  4:15 AM      Result Value Range   Heparin Unfractionated 0.31  0.30 - 0.70 IU/mL  CBC     Status: Abnormal   Collection Time    08/28/12  4:15 AM      Result Value Range   WBC 9.2  4.0 - 10.5 K/uL   RBC 3.42 (*) 3.87 - 5.11 MIL/uL   Hemoglobin 9.6 (*) 12.0 - 15.0 g/dL   HCT 16.1 (*) 09.6 - 04.5 %   MCV 83.3  78.0 - 100.0 fL   MCH 28.1  26.0 - 34.0 pg    MCHC 33.7  30.0 - 36.0 g/dL   RDW 40.9 (*) 81.1 - 91.4 %   Platelets 247  150 - 400 K/uL  BASIC METABOLIC PANEL     Status: Abnormal   Collection Time    08/28/12  4:15 AM      Result Value Range   Sodium 137  135 - 145 mEq/L   Potassium 3.9  3.5 - 5.1 mEq/L   Chloride 104  96 - 112 mEq/L   CO2 24  19 - 32 mEq/L   Glucose, Bld 125 (*) 70 - 99 mg/dL   BUN 18  6 - 23 mg/dL   Creatinine, Ser 7.82  0.50 - 1.10 mg/dL   Calcium 9.0  8.4 - 95.6 mg/dL   GFR calc non Af Amer 56 (*) >90 mL/min   GFR calc Af Amer 65 (*) >90 mL/min   Radiology:  Dg Chest Port 1 View  08/26/2012   *RADIOLOGY REPORT*  Clinical Data: Chest pain which radiated into both upper extremities earlier today.  Prior history of MI.  PORTABLE CHEST - 1 VIEW  Comparison: Portable chest x-ray 05/20/2012, 02/27/2012 and two- view chest x-ray 09/30/2011.  Findings: Cardiac silhouette upper normal in size for AP portable technique.  Thoracic aorta atherosclerotic, unchanged.  Hilar and mediastinal contours otherwise unremarkable.  Lungs clear. Bronchovascular markings normal.  Pulmonary vascularity normal.  No pneumothorax.  No pleural effusions.  IMPRESSION: Stable borderline heart size.  No acute cardiopulmonary disease.   Original Report Authenticated By: Hulan Saas, M.D.    ASSESSMENT: This 79 year old Caucasian female with multiple cardiac risk factors and known severe three-vessel native coronary artery disease not amenable to any intervention who presented with chest pain and was found to have mildly elevated troponin at to 0.84. this is by definition consistent with non-ST elevation myocardial infarction.  Unfortunately patient has  history of multiple gastric ulcers therefore triple therapy at this point could be quite risky. Her vital signs will likely not allowed to aggressive up titration of beta blockers and nitrates.   1. CAD:  Known 3 V CAD - not amenable to PCI or CABG.  Continue medical therapy - heparin for 1 more  day.  Ranexa has been started.  Ambulate.   She prefers the NTG patch - the Imdur tabs were not being digested ( she would find them in her colostomy bag)  Will restart NTG patch 0.4 / hr each day.  I stressed the importance of a nitrate free period at night.   Anticipate Dc tomorrow.    Elyn Aquas., MD 08/28/2012 8:18 AM

## 2012-08-29 ENCOUNTER — Telehealth: Payer: Self-pay | Admitting: Cardiology

## 2012-08-29 DIAGNOSIS — Z48812 Encounter for surgical aftercare following surgery on the circulatory system: Secondary | ICD-10-CM

## 2012-08-29 DIAGNOSIS — I214 Non-ST elevation (NSTEMI) myocardial infarction: Secondary | ICD-10-CM

## 2012-08-29 DIAGNOSIS — E119 Type 2 diabetes mellitus without complications: Secondary | ICD-10-CM

## 2012-08-29 DIAGNOSIS — R0989 Other specified symptoms and signs involving the circulatory and respiratory systems: Secondary | ICD-10-CM

## 2012-08-29 DIAGNOSIS — I70219 Atherosclerosis of native arteries of extremities with intermittent claudication, unspecified extremity: Secondary | ICD-10-CM

## 2012-08-29 LAB — CBC
MCH: 27.3 pg (ref 26.0–34.0)
MCHC: 32.4 g/dL (ref 30.0–36.0)
MCV: 84.2 fL (ref 78.0–100.0)
Platelets: 238 10*3/uL (ref 150–400)
RDW: 17.8 % — ABNORMAL HIGH (ref 11.5–15.5)

## 2012-08-29 LAB — BASIC METABOLIC PANEL
CO2: 26 mEq/L (ref 19–32)
Calcium: 9.3 mg/dL (ref 8.4–10.5)
Creatinine, Ser: 0.94 mg/dL (ref 0.50–1.10)
GFR calc Af Amer: 65 mL/min — ABNORMAL LOW (ref >90)

## 2012-08-29 MED ORDER — RANOLAZINE ER 500 MG PO TB12: 500.0000 mg | ORAL_TABLET | Freq: Two times a day (BID) | ORAL | Status: DC

## 2012-08-29 MED ORDER — NITROGLYCERIN 0.4 MG/HR TD PT24: 1.0000 | MEDICATED_PATCH | TRANSDERMAL | Status: DC

## 2012-08-29 NOTE — Progress Notes (Signed)
Utilization review completed.  

## 2012-08-29 NOTE — Discharge Summary (Addendum)
Physician Discharge Summary  Patient ID: Julia Greene MRN: [id number] DOB/AGE: May 30, 1930 79 y.o.  Admit date: 08/26/2012 Discharge date: 08/29/2012  Primary Care Physician:  Julia Grout, MD  Discharge Diagnoses:    . Chest pain- resolved, NSTEMI with known history of CAD, medically treated  . Atrial fibrillation . Coronary artery disease . Cardiomyopathy . Diabetes mellitus . HTN (hypertension) . Hyperlipidemia . PAD (peripheral artery disease) . Toe osteomyelitis, right- completed course of antibiotics  Consults:  Cardiology- Dr Julia Greene   Recommendations for Outpatient Follow-up:    Allergies:   Allergies  Allergen Reactions  . Aspirin Other (See Comments)    High doses of aspirin puts patient into COMA; currently taking low dose aspirin  . Benzoin Other (See Comments)    Puts patient into COMA  . Cephalexin Rash and Other (See Comments)    Puts patient into COMA  . Cephalosporins Other (See Comments)    Puts patient into COMA  . Codeine Swelling and Rash    CANNOT TAKE Injectable. Can take PO form  . Contrast Media (Iodinated Diagnostic Agents) Other (See Comments)    Causes SYNCOPE / HYPOTENSION  . Demerol Other (See Comments)    Puts patient into COMA  . Dilaudid (Hydromorphone Hcl) Other (See Comments)    Puts patient into COMA  . Insulins Other (See Comments)    "reacted violently to medications" unsure about what that means  . Morphine And Related Other (See Comments)    Puts patient into COMA  . Other Other (See Comments)    Puts patient into COMA.Patient highly sensitive to antibiotics, narcotics and any medication used to put patient to sleep for surgery.  . Penicillins Other (See Comments)    Puts patient into COMA  . Sulfa Antibiotics Other (See Comments)    Puts patient into COMA  . Valium (Diazepam) Other (See Comments)    VERY CRAZY FEELINGS AND ACTIONS, CLIMBING THE WALLS TYPE REACTION  . Vistaril (Hydroxyzine Hcl) Other (See Comments)     CAUSES PATIENT  TO FEEL LIKE "CLIMBING THE WALLS TYPE OF REACTION  . Carafate (Sucralfate) Nausea And Vomiting  . Latex Itching and Rash  . Lisinopril Nausea And Vomiting and Cough     Discharge Medications:   Medication List    STOP taking these medications       doxycycline 50 MG capsule  Commonly known as:  VIBRAMYCIN      TAKE these medications       acetaminophen-codeine 300-30 MG per tablet  Commonly known as:  TYLENOL #3  Take 1-2 tablets by mouth every 4 (four) hours as needed for pain.     ADULT GUMMY PO  Take 2 tablets by mouth daily.     aspirin EC 81 MG tablet  Take 81 mg by mouth daily.     BENADRYL PO  Take 30 mLs by mouth as needed. For cough     ezetimibe 10 MG tablet  Commonly known as:  ZETIA  Take 1 tablet (10 mg total) by mouth daily.     furosemide 20 MG tablet  Commonly known as:  LASIX  Take 1 tablet (20 mg total) by mouth daily.     levothyroxine 25 MCG tablet  Commonly known as:  SYNTHROID, LEVOTHROID  Take 25 mcg by mouth daily.     Melatonin 5 MG Caps  Take 10 mg by mouth at bedtime. To help sleep     metoprolol 50 MG tablet  Commonly known as:  LOPRESSOR  Take 0.5 tablets (25 mg total) by mouth 2 (two) times daily.     naproxen sodium 220 MG tablet  Commonly known as:  ANAPROX  Take 220 mg by mouth 2 (two) times daily with a meal.     nitroGLYCERIN 0.4 mg/hr patch  Commonly known as:  NITRODUR - Dosed in mg/24 hr  Place 1 patch (0.4 mg total) onto the skin daily.     nitroGLYCERIN 0.4 MG SL tablet  Commonly known as:  NITROSTAT  Place 0.4 mg under the tongue every 5 (five) minutes as needed for chest pain.     pantoprazole 40 MG tablet  Commonly known as:  PROTONIX  Take 40 mg by mouth daily.     ranolazine 500 MG 12 hr tablet  Commonly known as:  RANEXA  Take 1 tablet (500 mg total) by mouth 2 (two) times daily.     saccharomyces boulardii 250 MG capsule  Commonly known as:  FLORASTOR  Take 1 capsule (250 mg total)  by mouth 2 (two) times daily.         Brief H and P: For complete details please refer to admission H and P, but in briefJane F Greene is a 79 y.o. Caucasian female with history of coronary artery disease, A. fib in the setting of sepsis resolved not on anticoagulation, hypertension, hyperlipidemia, diabetes, carotid stenosis, LV dysfunction with EF of 40-45%, hypothyroidism, GERD, peripheral artery disease, carotid stenosis, cardiomyopathy, diverticulitis of the colon status post perforation and status post colostomy/ostomy in October of 2012 and non-ST elevation MI who presents with the complaints of chest pain. Patient has had extensive heart disease, she was recently evaluated by cardiology that 1 month ago before she traveled out of state, she just arrived back to [facility] yesterday. This morning around 1130 a.m. while she was making breakfast, she had sudden onset of chest pressure that was sharp and severe with radiating down her both arms, with some radiation to her left jaw. The pain was similar to her previous heart attacks. She took 3 sublingual nitroglycerin tablets without relief. She called her son around to 2:20 p.m., given she was still having pain he called EMS. Patient received few more nitroglycerin on the way to the emergency department, she was chest pain-free by 430 p.m. Hospitalist service was called to admit the patient for further care and management. Of note patient's son indicated since 07/15/2012, patient had taken at least 40 tablets of sublingual nitroglycerin. She denied any recent fevers, chills, nausea, vomiting, shortness of breath, abdominal pain, diarrhea, headaches or vision changes. She also has recently completed course of antibiotics for the right toe cellulitis yesterday which she took for about 4 weeks.   Hospital Course:  Chest pain - resolved, NSTEMI, with known history of three-vessel coronary disease. Cardiology was consulted. Patient was placed on heparin  drip after the second troponin was elevated at 0.87. Serial cardiac enzymes were obtained, continue to trend down, normalized on 08/27/12. Patient was placed on Ranexa and nitroglycerin patch. Patient reported on 8/3 that she had a recent air flight to [facility] and back, ordered a d-dimer, was elevated at 2.22, nuclear medicine VQ scan was done which showed low probability of PE. Heparin drip was continued for more than 48 hours. She was cleared by cardiology for discharge.  Hypertension Stable. Continue home antihypertensive medications.   Diabetes  - Continue to be diet controlled. Last hemoglobin A1c 7.0 in June of 2014. Patient has an insulin allergy, not  started on any sliding scale insulin. Has been on metformin in the past, currently not listed on her medication list.   Right toe osteomyelitis  Completed course of antibiotics, no acute infection at this time.   Hypothyroidism  Continue levothyroxin.   Carotid artery stenosis stable.   History of diverticulitis of the colon status post colostomy Stable.   Anemia  Recent history of upper GI bleed status post transfusion in April of 2014, EGD showed multiple gastric ulcers. Continue PPI.   GERD  Continue PPI.   Acute renal insufficiency resolved    Day of Discharge BP 154/66  Pulse 62  Temp(Src) 97.8 F (36.6 C) (Oral)  Resp 16  Ht 5\' 2"  (1.575 m)  Wt 58.87 kg (129 lb 12.6 oz)  BMI 23.73 kg/m2  SpO2 95%  Physical Exam: General: Alert and awake oriented x3 not in any acute distress. CVS: S1-S2 clear no murmur rubs or gallops Chest: clear to auscultation bilaterally, no wheezing rales or rhonchi Abdomen: soft nontender, nondistended, normal bowel sounds Extremities: no cyanosis, clubbing or edema noted bilaterally   The results of significant diagnostics from this hospitalization (including imaging, microbiology, ancillary and laboratory) are listed below for reference.    LAB RESULTS: Basic Metabolic  Panel:  Recent Labs Lab 08/28/12 0415 08/29/12 0720  NA 137 137  K 3.9 4.4  CL 104 103  CO2 24 26  GLUCOSE 125* 133*  BUN 18 16  CREATININE 0.94 0.94  CALCIUM 9.0 9.3   Liver Function Tests: No results found for this basename: AST, ALT, ALKPHOS, BILITOT, PROT, ALBUMIN,  in the last 168 hours No results found for this basename: LIPASE, AMYLASE,  in the last 168 hours No results found for this basename: AMMONIA,  in the last 168 hours CBC:  Recent Labs Lab 08/26/12 1559  08/28/12 0415 08/29/12 0720  WBC 8.4  < > 9.2 8.4  NEUTROABS 4.9  --   --   --   HGB 9.3*  < > 9.6* 9.5*  HCT 28.4*  < > 28.5* 29.3*  MCV 83.3  < > 83.3 84.2  PLT 263  < > 247 238  < > = values in this interval not displayed. Cardiac Enzymes:  Recent Labs Lab 08/27/12 1429 08/27/12 2032  TROPONINI <0.30 <0.30   BNP: No components found with this basename: POCBNP,  CBG:  Recent Labs Lab 08/26/12 2105  GLUCAP 88    Significant Diagnostic Studies:  Dg Chest Port 1 View  08/26/2012   *RADIOLOGY REPORT*  Clinical Data: Chest pain which radiated into both upper extremities earlier today.  Prior history of MI.  PORTABLE CHEST - 1 VIEW  Comparison: Portable chest x-ray 05/20/2012, 02/27/2012 and two- view chest x-ray 09/30/2011.  Findings: Cardiac silhouette upper normal in size for AP portable technique.  Thoracic aorta atherosclerotic, unchanged.  Hilar and mediastinal contours otherwise unremarkable.  Lungs clear. Bronchovascular markings normal.  Pulmonary vascularity normal.  No pneumothorax.  No pleural effusions.  IMPRESSION: Stable borderline heart size.  No acute cardiopulmonary disease.   Original Report Authenticated By: Hulan Saas, M.D.     Disposition and Follow-up: Discharge Orders   Future Appointments Provider Department Dept Phone   08/30/2012 9:15 AM Julia Grout, MD [facility] CARE [phone]   08/31/2012 10:30 AM Rachael Fee, MD [facility]  [phone]   09/06/2012 11:00 AM Minda Meo, PA-C [place] Main Office [phone]   05/25/2013 9:30 AM [facility] [phone]   05/25/2013 10:30 AM Sherren Kerns, MD [facility] [phone]   Future Orders Complete By Expires     Diet - low sodium heart healthy  As directed     Increase activity slowly  As directed         DISPOSITION: Home DIET:carb modified low sodium diet  DISCHARGE FOLLOW-UP Follow-up Information   Follow up with Julia Grout, MD On 08/30/2012. (9:15am)    Contact information:   [address] [place] [address] [phone]       Follow up with Rob Bunting, MD On 08/31/2012. (at 10:30am)    Contact information:   [address] [place] [address] [phone]       Follow up with Rick Duff, PA-C On 09/06/2012. (11:00 AM)    Contact information:   [address] [place] [address] [phone]       Time spent on Discharge: 35 mins  Signed:   RAI,RIPUDEEP M.D. [facility] 08/29/2012, 10:34 AM Pager: [phone]   Addendum: Received query to meet CMS AMI core measures, why Ms. Rollinson was not on statins.  Patient was on Zetia (ezetimibe) 10mg  daily prior to admission hence it was continued throughout the hospitalization as well as DC'ed on it. Patient is followed closely by [facility] cardiology out-patient in office. She may not have been on statins outpatient due to prior history of possible transaminitis. Patient was followed closely by cardiology during this hospitalization as well and did not recommend starting her on statins at this time.   RAI,RIPUDEEP M.D. [facility] 09/14/2012, 2:11 PM  Pager: [phone]

## 2012-08-29 NOTE — Telephone Encounter (Signed)
TCM call 08/30/12

## 2012-08-29 NOTE — Progress Notes (Signed)
Clinical Social Worker (CSW) met with pt to discuss possible SNF placement. Pt is not agreeable to rehab however states she will dc home with her son Lonni Fix. CSW contacted pt son and informed him that pt is not agreeable. Son agrees that pt does not need SNF placement however pt son's (Nolan/Byron) would like ALF placement for pt as pt is unable to care for herself and family is unable to provide 24 hr care. Lonni Fix agreeable to take pt home today and plans to visit ALF's with brother and pt in the next few weeks. CSW has agreed to complete an FL2 for pt however will not do an ALF/SNF search.   Full assessment to follow.  Theresia Bough, MSW, [facility] [phone]

## 2012-08-29 NOTE — Progress Notes (Signed)
Clinical Social Work Department BRIEF PSYCHOSOCIAL ASSESSMENT 08/29/2012  Patient:  Julia Greene, Julia Greene     Account Number:  [id number]     Admit date:  08/26/2012  Clinical Social Worker:  Lourdes Sledge  Date/Time:  08/29/2012 10:59 AM  Referred by:  Physician  Date Referred:  08/29/2012 Referred for  SNF Placement   Other Referral:   Interview type:  Patient Other interview type:   CSW also completed assessment with pt son Julia Greene [phone]    PSYCHOSOCIAL DATA Living Status:  FAMILY Admitted from facility:   Level of care:   Primary support name:  Julia Greene [phone] Primary support relationship to patient:  CHILD, ADULT Degree of support available:   Pt children appear actively involved in pt care.    CURRENT CONCERNS Current Concerns  Post-Acute Placement   Other Concerns:    SOCIAL WORK ASSESSMENT / PLAN CSW informed that pt son intersted in placement for pt.    CSW visited pt room and introduced herself to pt. CSW informed pt of family request/interest in potential placement for pt. Pt stated she would not be interested in placement here in [place] however would be agreeable to placement where she is from [place], [place]. Pt stated her son [name] lives in the [place] however pt currently living with her son Julia Greene in [place]. Pt confirmed she lives in a 2 story home, however her family is very busy. Pt remained declining any placement and states she plans to return with son Julia Greene today and eventually move back to [facility].    CSW contacted pt son and informed son of pt request to return home. Julia Greene is agreeable for pt to return home however states he and his brother plan to visit ALF's in the next few weeks as pt is unable to care for herself or return home.    CSW agreed to provide son with an FL2 that will be valid for 30days, pt son very appreciative.    CSW signing off.   Assessment/plan status:  Psychosocial Support/Ongoing Assessment of Needs Other assessment/ plan:    Information/referral to community resources:   CSW offered to provide son with a list of ALF however son stated he has been given a list already. CSW completed an FL2 for pt as she will most likely be placed in an ALF in the next few weeks.    PATIENT'S/FAMILY'S RESPONSE TO PLAN OF CARE: Pt alert and oriented and prefers to return home at dc. Pt son involved in care and agreeable to take pt home today.       Theresia Bough, MSW, [facility] [phone]

## 2012-08-29 NOTE — Progress Notes (Signed)
TELEMETRY: Reviewed telemetry pt in : SR, PVCs occasionally Filed Vitals:   08/28/12 1516 08/28/12 2103 08/28/12 2137 08/29/12 0454  BP: 157/68 154/66 154/66 154/66  Pulse: 69 69 69 62  Temp: 98 F (36.7 C)  97.8 F (36.6 C) 97.8 F (36.6 C)  TempSrc: Oral     Resp: 18  16 16   Height:      Weight:      SpO2: 98%  99% 95%    Intake/Output Summary (Last 24 hours) at 08/29/12 0939 Last data filed at 08/29/12 0900  Gross per 24 hour  Intake    449 ml  Output      0 ml  Net    449 ml    SUBJECTIVE Pt denies chest pain or SOB.   LABS: Basic Metabolic Panel:  Recent Labs  16/10/96 0415 08/29/12 0720  NA 137 137  K 3.9 4.4  CL 104 103  CO2 24 26  GLUCOSE 125* 133*  BUN 18 16  CREATININE 0.94 0.94  CALCIUM 9.0 9.3   CBC:  Recent Labs  08/26/12 1559  08/28/12 0415 08/29/12 0720  WBC 8.4  < > 9.2 8.4  NEUTROABS 4.9  --   --   --   HGB 9.3*  < > 9.6* 9.5*  HCT 28.4*  < > 28.5* 29.3*  MCV 83.3  < > 83.3 84.2  PLT 263  < > 247 238  < > = values in this interval not displayed. Cardiac Enzymes:  Recent Labs  08/27/12 0905 08/27/12 1429 08/27/12 2032  TROPONINI <0.30 <0.30 <0.30   D-Dimer: Recent Labs  08/28/12 0825  DDIMER 2.22*   Radiology/Studies:  Dg Chest 2 View 08/28/2012   *RADIOLOGY REPORT*  Clinical Data: Chest pain  CHEST - 2 VIEW  Comparison: 08/26/2012  Findings: Increased interstitial markings.  No frank interstitial edema.  No pleural effusion or pneumothorax.  The heart is normal in size.  Mild superior endplate changes involving a lower thoracic vertebral body, likely T12, unchanged from 2013.  IMPRESSION: No evidence of acute cardiopulmonary disease.   Original Report Authenticated By: Charline Bills, M.D.   Nm Pulmonary Perf And Vent 08/28/2012   *RADIOLOGY REPORT*  Clinical Data:  Chest pain and shortness of breath.  NUCLEAR MEDICINE VENTILATION - PERFUSION LUNG SCAN  Technique:  Wash-in, equilibrium, and wash-out phase ventilation  images were obtained using Xe-133 gas.  Perfusion images were obtained in multiple projections after intravenous injection of Tc- 68m MAA.  Radiopharmaceuticals:  40 mCi aerosolized technetium DTPA and 6.0 mCi Tc-91m MAA.  Comparison:  Chest x-ray 09/24/2012  Findings: The ventilation scan demonstrates slight decreased ventilation in the upper lobes bilaterally.  Central clumping of the radiopharmaceutical is noted.  No large segmental defects are demonstrated here  The perfusion scan demonstrates patchy E non segmental perfusion defects and both lungs.  Findings are most likely due to emphysema. I do not see any definite segmental or subsegmental defects to suggest pulmonary embolism but because of the diffuse abnormality this is considered a low probability study for pulmonary embolism.  IMPRESSION: Low probability ventilation perfusion lung scan for pulmonary embolism.   Original Report Authenticated By: Rudie Meyer, M.D.     PHYSICAL EXAM General: Well developed, well nourished, elderly female in no acute distress. Head: Normocephalic, atraumatic, sclera non-icteric, no xanthomas, nares are without discharge. Neck: Negative for carotid bruits. JVD not elevated. Lungs: Clear bilaterally to auscultation without wheezes, rales, or rhonchi. Breathing is unlabored. Heart: RRR S1  S2 with murmur, no rubs, or gallops.  Abdomen: Soft, non-tender, non-distended with normoactive bowel sounds. No hepatomegaly. No rebound/guarding. No obvious abdominal masses. Colostomy bag in place Msk:  Strength and tone appears normal for age. Extremities: No clubbing, cyanosis or edema.  Distal pedal pulses are 2+ and equal bilaterally. Neuro: Alert and oriented X 3. Moves all extremities spontaneously. Psych:  Responds to questions appropriately with a normal affect.  ASSESSMENT AND PLAN:  Principal Problem:   Chest pain - Minimal elevation in cardiac enzymes.  Medical therapy for CAD, no chest pain on current Rx,  continue. Will make f/u appt. No further wkup.  Active Problems:   Atrial fibrillation - maintaining SR, occasional PVCs   HTN (hypertension)   Cardiomyopathy   Coronary artery disease   Hyperlipidemia   PAD (peripheral artery disease)   Diabetes mellitus   Toe osteomyelitis, right    Signed, Theodore Demark, PA-C  Patient seen and examined and history reviewed. Agree with above findings and plan. Patient is without chest pain. Known history of CAD with cardiac cath 2013 showing occluded Lcx and multivessel disease- not suitable for PCI. Will continue medical Rx. OK for DC today.  Theron Arista [facility] 08/29/2012 12:52 PM

## 2012-08-29 NOTE — Clinical Documentation Improvement (Signed)
THIS DOCUMENT IS NOT A PERMANENT PART OF THE MEDICAL RECORD  Please update your documentation with the medical record to reflect your response to this query. If you need help knowing how to do this please call [phone].  08/29/12   Dear Dr.Landry Lookingbill,  Per chart Consult Note: "CP with elevated Troponin". Per 8/2 Note:"chest pain resolved- NSTEMI".  However, DC summary states "chest pain".  Please clarify in addendum to more clearly illustrate this patient's severity of illness and risk of mortality. Thank you.   You may use possible, probable, or suspect with inpatient documentation. possible, probable, suspected diagnoses MUST be documented at the time of discharge  Reviewed: dc summary was still in INCOMPLETE MODE when this query was sent. Please DC this query. DC summary clearly reflects NSTEMI in the diagnosis.  Thank You,  Beverley Fiedler RN BSN Clinical Documentation Specialist: [phone] Health Information Management [place]

## 2012-08-29 NOTE — Progress Notes (Signed)
CSW received referral for placement, however, PT/OT have not been ordered for pt.  CSW to assess once recommendations have been made for pt.  Aviendha Azbell, LCSWA

## 2012-08-29 NOTE — Telephone Encounter (Signed)
New problem   Pt has 14 day TCM 09/06/12 w/Brooke per Thayer Ohm PA calling.

## 2012-08-30 ENCOUNTER — Ambulatory Visit (INDEPENDENT_AMBULATORY_CARE_PROVIDER_SITE_OTHER): Payer: Medicare Other | Admitting: Internal Medicine

## 2012-08-30 VITALS — BP 110/62 | HR 72 | Temp 96.4°F | Resp 18 | Ht 64.0 in | Wt 134.4 lb

## 2012-08-30 DIAGNOSIS — I10 Essential (primary) hypertension: Secondary | ICD-10-CM

## 2012-08-30 DIAGNOSIS — I2510 Atherosclerotic heart disease of native coronary artery without angina pectoris: Secondary | ICD-10-CM

## 2012-08-30 DIAGNOSIS — E119 Type 2 diabetes mellitus without complications: Secondary | ICD-10-CM

## 2012-08-30 NOTE — Telephone Encounter (Signed)
Patient contacted regarding discharge from [facility] on 08/29/12.  Patient understands to follow up with provider Rick Duff, PA on 09/06/12 at 11:00 am at [facility]. Patient understands discharge instructions? yes Patient understands medications and regiment? yes Patient understands to bring all medications to this visit? yes

## 2012-08-31 ENCOUNTER — Emergency Department (HOSPITAL_COMMUNITY): Payer: Medicare Other

## 2012-08-31 ENCOUNTER — Ambulatory Visit: Payer: Medicare Other | Admitting: Gastroenterology

## 2012-08-31 ENCOUNTER — Encounter (HOSPITAL_COMMUNITY): Payer: Self-pay | Admitting: Emergency Medicine

## 2012-08-31 ENCOUNTER — Inpatient Hospital Stay (HOSPITAL_COMMUNITY)
Admission: EM | Admit: 2012-08-31 | Discharge: 2012-09-04 | DRG: 282 | Disposition: A | Discharge status: Home / Self Care | Payer: Medicare Other | Attending: Internal Medicine | Admitting: Internal Medicine

## 2012-08-31 DIAGNOSIS — I129 Hypertensive chronic kidney disease with stage 1 through stage 4 chronic kidney disease, or unspecified chronic kidney disease: Secondary | ICD-10-CM | POA: Diagnosis present

## 2012-08-31 DIAGNOSIS — L97519 Non-pressure chronic ulcer of other part of right foot with unspecified severity: Secondary | ICD-10-CM

## 2012-08-31 DIAGNOSIS — T46905A Adverse effect of unspecified agents primarily affecting the cardiovascular system, initial encounter: Secondary | ICD-10-CM | POA: Diagnosis present

## 2012-08-31 DIAGNOSIS — R0989 Other specified symptoms and signs involving the circulatory and respiratory systems: Secondary | ICD-10-CM

## 2012-08-31 DIAGNOSIS — Z96649 Presence of unspecified artificial hip joint: Secondary | ICD-10-CM

## 2012-08-31 DIAGNOSIS — M79609 Pain in unspecified limb: Secondary | ICD-10-CM

## 2012-08-31 DIAGNOSIS — I214 Non-ST elevation (NSTEMI) myocardial infarction: Principal | ICD-10-CM | POA: Diagnosis present

## 2012-08-31 DIAGNOSIS — M869 Osteomyelitis, unspecified: Secondary | ICD-10-CM

## 2012-08-31 DIAGNOSIS — I200 Unstable angina: Secondary | ICD-10-CM

## 2012-08-31 DIAGNOSIS — F22 Delusional disorders: Secondary | ICD-10-CM | POA: Diagnosis present

## 2012-08-31 DIAGNOSIS — E119 Type 2 diabetes mellitus without complications: Secondary | ICD-10-CM | POA: Diagnosis present

## 2012-08-31 DIAGNOSIS — I10 Essential (primary) hypertension: Secondary | ICD-10-CM | POA: Diagnosis present

## 2012-08-31 DIAGNOSIS — Z79899 Other long term (current) drug therapy: Secondary | ICD-10-CM

## 2012-08-31 DIAGNOSIS — K222 Esophageal obstruction: Secondary | ICD-10-CM

## 2012-08-31 DIAGNOSIS — I255 Ischemic cardiomyopathy: Secondary | ICD-10-CM | POA: Diagnosis present

## 2012-08-31 DIAGNOSIS — M7989 Other specified soft tissue disorders: Secondary | ICD-10-CM

## 2012-08-31 DIAGNOSIS — Z933 Colostomy status: Secondary | ICD-10-CM

## 2012-08-31 DIAGNOSIS — K922 Gastrointestinal hemorrhage, unspecified: Secondary | ICD-10-CM

## 2012-08-31 DIAGNOSIS — F4323 Adjustment disorder with mixed anxiety and depressed mood: Secondary | ICD-10-CM

## 2012-08-31 DIAGNOSIS — R112 Nausea with vomiting, unspecified: Secondary | ICD-10-CM | POA: Diagnosis present

## 2012-08-31 DIAGNOSIS — K5720 Diverticulitis of large intestine with perforation and abscess without bleeding: Secondary | ICD-10-CM

## 2012-08-31 DIAGNOSIS — Z7982 Long term (current) use of aspirin: Secondary | ICD-10-CM

## 2012-08-31 DIAGNOSIS — R011 Cardiac murmur, unspecified: Secondary | ICD-10-CM

## 2012-08-31 DIAGNOSIS — I4891 Unspecified atrial fibrillation: Secondary | ICD-10-CM

## 2012-08-31 DIAGNOSIS — E039 Hypothyroidism, unspecified: Secondary | ICD-10-CM | POA: Diagnosis present

## 2012-08-31 DIAGNOSIS — D638 Anemia in other chronic diseases classified elsewhere: Secondary | ICD-10-CM | POA: Diagnosis present

## 2012-08-31 DIAGNOSIS — E559 Vitamin D deficiency, unspecified: Secondary | ICD-10-CM | POA: Diagnosis present

## 2012-08-31 DIAGNOSIS — Z48812 Encounter for surgical aftercare following surgery on the circulatory system: Secondary | ICD-10-CM

## 2012-08-31 DIAGNOSIS — I340 Nonrheumatic mitral (valve) insufficiency: Secondary | ICD-10-CM

## 2012-08-31 DIAGNOSIS — I70219 Atherosclerosis of native arteries of extremities with intermittent claudication, unspecified extremity: Secondary | ICD-10-CM

## 2012-08-31 DIAGNOSIS — F39 Unspecified mood [affective] disorder: Secondary | ICD-10-CM

## 2012-08-31 DIAGNOSIS — R079 Chest pain, unspecified: Secondary | ICD-10-CM

## 2012-08-31 DIAGNOSIS — I2510 Atherosclerotic heart disease of native coronary artery without angina pectoris: Secondary | ICD-10-CM | POA: Diagnosis present

## 2012-08-31 DIAGNOSIS — I2589 Other forms of chronic ischemic heart disease: Secondary | ICD-10-CM | POA: Diagnosis present

## 2012-08-31 DIAGNOSIS — I739 Peripheral vascular disease, unspecified: Secondary | ICD-10-CM

## 2012-08-31 DIAGNOSIS — N183 Chronic kidney disease, stage 3 unspecified: Secondary | ICD-10-CM | POA: Diagnosis present

## 2012-08-31 DIAGNOSIS — L97509 Non-pressure chronic ulcer of other part of unspecified foot with unspecified severity: Secondary | ICD-10-CM

## 2012-08-31 DIAGNOSIS — I219 Acute myocardial infarction, unspecified: Secondary | ICD-10-CM

## 2012-08-31 DIAGNOSIS — K219 Gastro-esophageal reflux disease without esophagitis: Secondary | ICD-10-CM | POA: Diagnosis present

## 2012-08-31 DIAGNOSIS — I059 Rheumatic mitral valve disease, unspecified: Secondary | ICD-10-CM

## 2012-08-31 DIAGNOSIS — E785 Hyperlipidemia, unspecified: Secondary | ICD-10-CM

## 2012-08-31 DIAGNOSIS — I959 Hypotension, unspecified: Secondary | ICD-10-CM

## 2012-08-31 HISTORY — DX: Personal history of pneumonia (recurrent): Z87.01

## 2012-08-31 HISTORY — DX: Peptic ulcer, site unspecified, unspecified as acute or chronic, without hemorrhage or perforation: K27.9

## 2012-08-31 HISTORY — DX: Chest pain, unspecified: R07.9

## 2012-08-31 HISTORY — DX: Chronic kidney disease, unspecified: N18.9

## 2012-08-31 HISTORY — DX: Ischemic cardiomyopathy: I25.5

## 2012-08-31 LAB — PROTIME-INR: INR: 1.21 (ref 0.00–1.49)

## 2012-08-31 LAB — POCT I-STAT TROPONIN I: Troponin i, poc: 0.08 ng/mL (ref 0.00–0.08)

## 2012-08-31 LAB — TROPONIN I
Troponin I: <0.3 ng/mL (ref <0.30)
Troponin I: 2.66 ng/mL (ref <0.30)
Troponin I: 3.08 ng/mL (ref <0.30)

## 2012-08-31 LAB — BASIC METABOLIC PANEL
CO2: 24 mEq/L (ref 19–32)
Chloride: 101 mEq/L (ref 96–112)
Sodium: 136 mEq/L (ref 135–145)

## 2012-08-31 LAB — PRO B NATRIURETIC PEPTIDE: Pro B Natriuretic peptide (BNP): 1924 pg/mL — ABNORMAL HIGH (ref 0–450)

## 2012-08-31 LAB — CBC
Platelets: 260 10*3/uL (ref 150–400)
RBC: 3.43 MIL/uL — ABNORMAL LOW (ref 3.87–5.11)
WBC: 11 10*3/uL — ABNORMAL HIGH (ref 4.0–10.5)

## 2012-08-31 LAB — GLUCOSE, CAPILLARY: Glucose-Capillary: 88 mg/dL (ref 70–99)

## 2012-08-31 MED ORDER — SACCHAROMYCES BOULARDII 250 MG PO CAPS
250.0000 mg | ORAL_CAPSULE | Freq: Two times a day (BID) | ORAL | Status: DC
Start: 2012-08-31 — End: 2012-09-04
  Administered 2012-08-31 – 2012-09-03 (×8): 250 mg via ORAL
  Filled 2012-08-31 (×10): qty 1

## 2012-08-31 MED ORDER — ACETAMINOPHEN 650 MG RE SUPP: 650.0000 mg | Freq: Four times a day (QID) | RECTAL | Status: DC | PRN

## 2012-08-31 MED ORDER — LEVOTHYROXINE SODIUM 25 MCG PO TABS
25.0000 ug | ORAL_TABLET | Freq: Every day | ORAL | Status: DC
  Administered 2012-09-01 – 2012-09-04 (×3): 25 ug via ORAL
  Filled 2012-08-31 (×5): qty 1

## 2012-08-31 MED ORDER — INSULIN ASPART 100 UNIT/ML ~~LOC~~ SOLN: 0.0000 [IU] | Freq: Three times a day (TID) | SUBCUTANEOUS | Status: DC

## 2012-08-31 MED ORDER — ONDANSETRON HCL 4 MG/2ML IJ SOLN
4.0000 mg | Freq: Once | INTRAMUSCULAR | Status: AC
  Administered 2012-08-31: 4 mg via INTRAVENOUS

## 2012-08-31 MED ORDER — SODIUM CHLORIDE 0.9 % IV SOLN
INTRAVENOUS | Status: DC
  Administered 2012-08-31: 11:00:00 via INTRAVENOUS

## 2012-08-31 MED ORDER — ASPIRIN EC 81 MG PO TBEC
81.0000 mg | DELAYED_RELEASE_TABLET | Freq: Every day | ORAL | Status: DC
  Administered 2012-08-31 – 2012-09-03 (×4): 81 mg via ORAL
  Filled 2012-08-31 (×5): qty 1

## 2012-08-31 MED ORDER — ENOXAPARIN SODIUM 30 MG/0.3ML ~~LOC~~ SOLN
30.0000 mg | SUBCUTANEOUS | Status: DC
  Filled 2012-08-31: qty 0.3

## 2012-08-31 MED ORDER — SODIUM CHLORIDE 0.9 % IV BOLUS (SEPSIS)
250.0000 mL | Freq: Once | INTRAVENOUS | Status: AC
  Administered 2012-08-31: 250 mL via INTRAVENOUS

## 2012-08-31 MED ORDER — HEPARIN BOLUS VIA INFUSION
3000.0000 [IU] | Freq: Once | INTRAVENOUS | Status: AC
  Administered 2012-08-31: 3000 [IU] via INTRAVENOUS
  Filled 2012-08-31: qty 3000

## 2012-08-31 MED ORDER — NITROGLYCERIN 0.4 MG SL SUBL
0.4000 mg | SUBLINGUAL_TABLET | SUBLINGUAL | Status: DC | PRN
  Filled 2012-08-31: qty 75

## 2012-08-31 MED ORDER — RANOLAZINE ER 500 MG PO TB12
500.0000 mg | ORAL_TABLET | Freq: Two times a day (BID) | ORAL | Status: DC
  Administered 2012-08-31: 500 mg via ORAL
  Filled 2012-08-31 (×4): qty 1

## 2012-08-31 MED ORDER — NITROGLYCERIN 0.4 MG/HR TD PT24
0.4000 mg | MEDICATED_PATCH | TRANSDERMAL | Status: DC
  Administered 2012-08-31: 0.4 mg via TRANSDERMAL
  Filled 2012-08-31: qty 1

## 2012-08-31 MED ORDER — INSULIN ASPART 100 UNIT/ML ~~LOC~~ SOLN: 0.0000 [IU] | Freq: Every day | SUBCUTANEOUS | Status: DC

## 2012-08-31 MED ORDER — HEPARIN (PORCINE) IN NACL 100-0.45 UNIT/ML-% IJ SOLN
800.0000 [IU]/h | INTRAMUSCULAR | Status: DC
  Administered 2012-08-31: 700 [IU]/h via INTRAVENOUS
  Administered 2012-09-01 – 2012-09-03 (×2): 800 [IU]/h via INTRAVENOUS
  Filled 2012-08-31 (×4): qty 250

## 2012-08-31 MED ORDER — SODIUM CHLORIDE 0.9 % IJ SOLN
3.0000 mL | Freq: Two times a day (BID) | INTRAMUSCULAR | Status: DC
  Administered 2012-08-31 – 2012-09-03 (×4): 3 mL via INTRAVENOUS

## 2012-08-31 MED ORDER — OXYCODONE HCL 5 MG PO TABS
5.0000 mg | ORAL_TABLET | Freq: Four times a day (QID) | ORAL | Status: DC | PRN
  Administered 2012-08-31 – 2012-09-01 (×2): 5 mg via ORAL
  Filled 2012-08-31 (×2): qty 1

## 2012-08-31 MED ORDER — NITROGLYCERIN 0.6 MG/HR TD PT24
0.6000 mg | MEDICATED_PATCH | TRANSDERMAL | Status: DC
  Administered 2012-09-01 – 2012-09-03 (×3): 0.6 mg via TRANSDERMAL
  Filled 2012-08-31 (×4): qty 1

## 2012-08-31 MED ORDER — METOPROLOL TARTRATE 25 MG PO TABS
25.0000 mg | ORAL_TABLET | Freq: Two times a day (BID) | ORAL | Status: DC
  Administered 2012-08-31 – 2012-09-03 (×8): 25 mg via ORAL
  Filled 2012-08-31 (×10): qty 1

## 2012-08-31 MED ORDER — EZETIMIBE 10 MG PO TABS
10.0000 mg | ORAL_TABLET | Freq: Every day | ORAL | Status: DC
  Administered 2012-08-31 – 2012-09-03 (×4): 10 mg via ORAL
  Filled 2012-08-31 (×5): qty 1

## 2012-08-31 MED ORDER — ONDANSETRON HCL 4 MG/2ML IJ SOLN: 4.0000 mg | Freq: Three times a day (TID) | INTRAMUSCULAR | Status: AC | PRN

## 2012-08-31 MED ORDER — PANTOPRAZOLE SODIUM 40 MG PO TBEC
40.0000 mg | DELAYED_RELEASE_TABLET | Freq: Every day | ORAL | Status: DC
  Administered 2012-08-31 – 2012-09-02 (×3): 40 mg via ORAL
  Filled 2012-08-31 (×3): qty 1

## 2012-08-31 MED ORDER — FUROSEMIDE 20 MG PO TABS
20.0000 mg | ORAL_TABLET | Freq: Every day | ORAL | Status: DC
  Administered 2012-08-31 – 2012-09-03 (×4): 20 mg via ORAL
  Filled 2012-08-31 (×5): qty 1

## 2012-08-31 MED ORDER — ACETAMINOPHEN 325 MG PO TABS
650.0000 mg | ORAL_TABLET | Freq: Four times a day (QID) | ORAL | Status: DC | PRN
  Administered 2012-08-31: 650 mg via ORAL
  Filled 2012-08-31: qty 2

## 2012-08-31 NOTE — ED Notes (Signed)
Verbal order from rn-natalie to draw a troponin.  Blood drawn.  Left hand.  Patient handled well.  Tubed to lab.

## 2012-08-31 NOTE — ED Notes (Signed)
Patient states that she started having nausea and vomiting around 2000 last night.   Patient states did not start having any chest pressure until around 0200 this morning.   Patient states that the pressure radiated to bilateral arms and into jaw.   Patient states that she currently has 7/10 pressure.   Patient states she thinks that the "dry heaving" may have made her have chest pressure.  Patient states she was here with MI last Thursday.

## 2012-08-31 NOTE — ED Notes (Signed)
i-stat troponin results called to primary nurse Dorene Grebe

## 2012-08-31 NOTE — ED Provider Notes (Signed)
CSN: [id number]     Arrival date & time 08/31/12  7829 History     None    Chief Complaint  Patient presents with  . Chest Pain  . Nausea  . Emesis   (Consider location/radiation/quality/duration/timing/severity/associated sxs/prior Treatment) The history is provided by the patient and medical records.   Patient with significant cardiac hx presents for nausea and multiple episodes of non-bloody, non-bilious, vomiting last night which she thinks is due to a new medication, Ranexa.  Vomiting was followed by sudden onset of left-sided chest pressure around 2 AM. Patient states pain radiates to bilateral upper extremities, and up into the left side of her jaw. No associated palpitations, SOB, diaphoresis, or LE edema.  Patient has taken 3 nitroglycerin SL and is applied a nitroglycerin patch without significant relief. Patient is on daily aspirin, she cannot take full-strength aspirin as it puts her into a coma.  Pt has had several NSTEMIs in the past, all treated medically.  Medical records reviewed-- Pt was recently admitted to the hospital on 08/26/12 for CP r/o.  During hospital stay, she did have an NSTEMI, tx with heparin.  Also had a V/Q scan on 08/28/12 which was negative for PE.  Past Medical History  Diagnosis Date  . Coronary artery disease     a. h/o NSTEMI 2012 in setting of AFib/perf'd divertic (in [place]. - coded, CPR, intubation during hospital stay);  b.  MV 2/13: EF 57%, mod isch septum and poss small part of the Ant wall;  c.  LHC 04/06/11: diffuse 3v CAD no high grade lesions amenable to PCI, surgery would be incomplete thus med rx recommended. d. NSTEMI 02/2012 managed medically.  . Atrial fibrillation     in setting of sepsis in [place]; tx with amiodarone (amio stopped 04/20/11);    Marland Kitchen Injury to liver   . Allergic rhinitis due to pollen   . Hyperlipidemia   . Hypertension   . Vitamin D deficiency   . Anemia, unspecified   . Diabetes mellitus     borderline  . Diverticulitis of  colon with perforation s/p colectomy/ostomy 13Oct2012 02/23/2011    s/p colostomy  . Carotid stenosis     a.  02/2011 - RICA 80-99% and LICA 60-79%;  b. s/p R CEA Dr. Darrick Penna 09/2011  . LV dysfunction     a.  Echo 10/2010 (in setting of NSTEMI at [facility] in [facility]):  mid ot apical septal AK, EF 40-45%, MAC, mild to mod MR, mild TR, no pulmo HTN;  b. Echo 02/2012: EF 45-50% with WMA, mild-mod MR.  Marland Kitchen PAD (peripheral artery disease)     a. ABIs 9/13: R 0.55; L 0.93;  b. arterial insufficiency ulcers on right foot;  followed by [facility];  c. angiogram 01/2012 with severe bilat SFA and pop disease => not good bypass candidate and PTA not an option => prob continue conservative mgmt  . Hypothyroid   . Fall at home Feb. 6, 2014    Left leg   . Complication of anesthesia     Allergie to narcotics and abx.  . Family history of anesthesia complication     "mother had allergies too" (05/20/2012)  . Anginal pain   . Pneumonia     "a few times; always associated w/surgeries" (05/20/2012)  . GERD (gastroesophageal reflux disease)   . Gastric ulcer     "just found these today" (05/20/2012)  . Arthritis     "legs, arm, bones; Chelation therapy cleared all that" (  05/20/2012)  . History of blood transfusion     "many" (05/20/2012)  . Toe ulcer, right     5th toe; due to PVD/notes 05/20/2012  . Myocardial infarction ~ 1952; Feb. 1, 2014    "I've had a total of 8 as of 02/2012" (05/20/2012)  . NSTEMI (non-ST elevated myocardial infarction) 05/20/2012    [name] 05/20/2012  . Colostomy in place    Past Surgical History  Procedure Laterality Date  . Total hip arthroplasty Right 07/2004  . Colostomy  10/2010    for complex diverticular disease.  not done in [place]  . Cesarean section  1955, 1959, 1960  . Abdominal adhesion surgery  1976    at time of hysterectomy  . Endarterectomy  10/20/2011    Procedure: ENDARTERECTOMY CAROTID;  Surgeon: Sherren Kerns, MD;  Location: [facility] OR;  Service: Vascular;  Laterality:  Right;  Right Carotid Endarterectomy with patch angioplasty  . Joint replacement  2006    Right  Hip  . Appendectomy  1943  . Abdominal hysterectomy  1976  . Combined hysterectomy abdominal w/ a&p repair / oophorectomy  1976    "left a piece of one ovary" 94/25/2014)  . Cardiac catheterization    . Colostomy  10/2010  . Cataract extraction w/ intraocular lens  implant, bilateral Bilateral ~ 2013  . Dilation and curettage of uterus  1950's    "3-4 during childbirth time" (05/20/2012)  . Colectomy  10/2009    sigmoid/notes 05/20/2012  . Esophagogastroduodenoscopy N/A 05/20/2012    Procedure: ESOPHAGOGASTRODUODENOSCOPY (EGD);  Surgeon: Louis Meckel, MD;  Location: [facility] ENDOSCOPY;  Service: Endoscopy;  Laterality: N/A;   Family History  Problem Relation Age of Onset  . Heart disease Mother 51    Pacemaker, No CAD Heart Disease before age 14  . Diverticulitis Mother   . Diabetes Mother   . Hyperlipidemia Mother   . Hypertension Mother   . Lung disease Father 74    Smoking  . Ovarian cancer Daughter   . Cancer Daughter   . Colon cancer Neg Hx   . Heart disease Maternal Grandmother   . Diabetes Maternal Aunt     x 2  . Diabetes Sister   . Heart disease Sister     Heart Disease before age 72  . Hyperlipidemia Sister   . Hypertension Sister    History  Substance Use Topics  . Smoking status: Never Smoker   . Smokeless tobacco: Never Used  . Alcohol Use: No   OB History   Grav Para Term Preterm Abortions TAB SAB Ect Mult Living                 Review of Systems  Cardiovascular: Positive for chest pain.  All other systems reviewed and are negative.    Allergies  Aspirin; Benzoin; Cephalexin; Cephalosporins; Codeine; Contrast media; Demerol; Dilaudid; Insulins; Morphine and related; Other; Penicillins; Sulfa antibiotics; Valium; Vistaril; Carafate; Latex; and Lisinopril  Home Medications   Current Outpatient Rx  Name  Route  Sig  Dispense  Refill  .  acetaminophen-codeine (TYLENOL #3) 300-30 MG per tablet   Oral   Take 1-2 tablets by mouth every 4 (four) hours as needed for pain.         Marland Kitchen aspirin EC 81 MG tablet   Oral   Take 81 mg by mouth daily.          . DiphenhydrAMINE HCl (BENADRYL PO)   Oral   Take 30 mLs by  mouth as needed. For cough         . ezetimibe (ZETIA) 10 MG tablet   Oral   Take 1 tablet (10 mg total) by mouth daily.   30 tablet   11   . furosemide (LASIX) 20 MG tablet   Oral   Take 1 tablet (20 mg total) by mouth daily.   30 tablet   3   . levothyroxine (SYNTHROID, LEVOTHROID) 25 MCG tablet   Oral   Take 25 mcg by mouth daily.         . Melatonin 5 MG CAPS   Oral   Take 10 mg by mouth at bedtime as needed. To help sleep         . metoprolol (LOPRESSOR) 50 MG tablet   Oral   Take 0.5 tablets (25 mg total) by mouth 2 (two) times daily.   60 tablet   6   . Multiple Vitamins-Minerals (ADULT GUMMY PO)   Oral   Take 2 tablets by mouth daily.         . naproxen sodium (ANAPROX) 220 MG tablet   Oral   Take 220 mg by mouth as needed.          . nitroGLYCERIN (NITRODUR - DOSED IN MG/24 HR) 0.4 mg/hr patch   Transdermal   Place 1 patch (0.4 mg total) onto the skin daily.   30 patch   12   . nitroGLYCERIN (NITROSTAT) 0.4 MG SL tablet   Sublingual   Place 0.4 mg under the tongue every 5 (five) minutes as needed for chest pain.         . pantoprazole (PROTONIX) 40 MG tablet   Oral   Take 40 mg by mouth daily.         . ranolazine (RANEXA) 500 MG 12 hr tablet   Oral   Take 1 tablet (500 mg total) by mouth 2 (two) times daily.   60 tablet   3   . saccharomyces boulardii (FLORASTOR) 250 MG capsule   Oral   Take 1 capsule (250 mg total) by mouth 2 (two) times daily.   60 capsule   0    BP 149/69  Pulse 80  Temp(Src) 97.7 F (36.5 C) (Oral)  Resp 20  Ht 5\' 2"  (1.575 m)  Wt 134 lb (60.782 kg)  BMI 24.5 kg/m2  SpO2 98%  Physical Exam  Nursing note and vitals  reviewed. Constitutional: She is oriented to person, place, and time. She appears well-developed and well-nourished. No distress.  HENT:  Head: Normocephalic and atraumatic.  Mouth/Throat: Oropharynx is clear and moist.  Eyes: Conjunctivae and EOM are normal. Pupils are equal, round, and reactive to light.  Neck: Normal range of motion. Neck supple.  Cardiovascular: Normal rate, regular rhythm and normal heart sounds.   Pulmonary/Chest: Effort normal and breath sounds normal. No respiratory distress. She has no decreased breath sounds. She has no wheezes.  Abdominal: Soft. Bowel sounds are normal. There is no tenderness.  Colostomy in place  Musculoskeletal: Normal range of motion. She exhibits no edema.  Neurological: She is alert and oriented to person, place, and time. She has normal strength. She displays no tremor. No cranial nerve deficit or sensory deficit. She displays no seizure activity.  CN grossly intact, moves all extremities appropriately without ataxia, no focal neuro deficits or facial droop appreciated  Skin: Skin is warm and dry. She is not diaphoretic.  Psychiatric: She has a normal mood and affect.  ED Course   Procedures (including critical care time)   Date: 08/31/2012  Rate: 75  Rhythm: normal sinus rhythm  QRS Axis: normal  Intervals: normal  ST/T Wave abnormalities: nonspecific T wave changes  Conduction Disutrbances:none  Narrative Interpretation: NSR, no STEMI  Old EKG Reviewed: unchanged   Labs Reviewed  CBC - Abnormal; Notable for the following:    WBC 11.0 (*)    RBC 3.43 (*)    Hemoglobin 9.4 (*)    HCT 28.8 (*)    RDW 18.4 (*)    All other components within normal limits  BASIC METABOLIC PANEL - Abnormal; Notable for the following:    Glucose, Bld 140 (*)    BUN 32 (*)    Creatinine, Ser 1.50 (*)    GFR calc non Af Amer 32 (*)    GFR calc Af Amer 37 (*)    All other components within normal limits  PRO B NATRIURETIC PEPTIDE - Abnormal;  Notable for the following:    Pro B Natriuretic peptide (BNP) 1924.0 (*)    All other components within normal limits  TROPONIN I  CBC  CREATININE, SERUM  TROPONIN I  TROPONIN I  TROPONIN I  POCT I-STAT TROPONIN I   Dg Chest 2 View  08/31/2012   *RADIOLOGY REPORT*  Clinical Data: Chest pain, nausea and vomiting.  CHEST - 2 VIEW  Comparison: PA and lateral chest 08/28/2012 and single view of the chest 05/21/2011.  Findings: The chest is hyperexpanded.  Heart size is normal. Kerley B lines are identified.  No consolidative process is seen. No pneumothorax or pleural fluid.  IMPRESSION:  1.  Findings compatible with mild interstitial edema. 2.  Pulmonary hyperexpansion suggestive of emphysema.   Original Report Authenticated By: Holley Dexter, M.D.   1. Chest pain   2. Atrial fibrillation   3. Coronary artery disease   4. Diabetes mellitus     MDM   EKG NSR, no acute ischemic changes.  CXR clear.  I-stat troponin .08, lab troponin WNL.  Labs appear at baseline.  Given cardiac hx and events during previous hospital admission, will admit for observation and cycling of cardiac enzymes.  Consulted hospitalist, Dr. Rhona Leavens, pt will be admitted to [facility], telemetry, observation.  Temp admit orders placed.  VS stable for transfer to floor.    Garlon Hatchet, PA-C 08/31/12 1035

## 2012-08-31 NOTE — Progress Notes (Signed)
ANTICOAGULATION CONSULT NOTE - Initial Consult  Pharmacy Consult for heparin Indication: chest pain/ACS  Allergies  Allergen Reactions  . Aspirin Other (See Comments)    High doses of aspirin puts patient into COMA; currently taking low dose aspirin  . Benzoin Other (See Comments)    Puts patient into COMA  . Cephalexin Rash and Other (See Comments)    Puts patient into COMA  . Cephalosporins Other (See Comments)    Puts patient into COMA  . Codeine Swelling and Rash    CANNOT TAKE Injectable. Can take PO form  . Contrast Media (Iodinated Diagnostic Agents) Other (See Comments)    Causes SYNCOPE / HYPOTENSION  . Demerol Other (See Comments)    Puts patient into COMA  . Dilaudid (Hydromorphone Hcl) Other (See Comments)    Puts patient into COMA  . Insulins Other (See Comments)    "reacted violently to medications" unsure about what that means  . Morphine And Related Other (See Comments)    Puts patient into COMA  . Other Other (See Comments)    Puts patient into COMA.Patient highly sensitive to antibiotics, narcotics and any medication used to put patient to sleep for surgery.  . Penicillins Other (See Comments)    Puts patient into COMA  . Sulfa Antibiotics Other (See Comments)    Puts patient into COMA  . Valium (Diazepam) Other (See Comments)    VERY CRAZY FEELINGS AND ACTIONS, CLIMBING THE WALLS TYPE REACTION  . Vistaril (Hydroxyzine Hcl) Other (See Comments)    CAUSES PATIENT  TO FEEL LIKE "CLIMBING THE WALLS TYPE OF REACTION  . Ranexa (Ranolazine)     ? n/v  . Carafate (Sucralfate) Nausea And Vomiting  . Latex Itching and Rash  . Lisinopril Nausea And Vomiting and Cough    Patient Measurements: Height: 5\' 2"  (157.5 cm) Weight: 129 lb 13.6 oz (58.9 kg) IBW/kg (Calculated) : 50.1 Heparin Dosing Weight: 58.9kg  Vital Signs: Temp: 97.1 F (36.2 C) (08/06 1400) Temp src: Oral (08/06 1400) BP: 124/46 mmHg (08/06 1400) Pulse Rate: 66 (08/06  1400)  Labs:  Recent Labs  08/29/12 0720 08/31/12 0655 08/31/12 0700 08/31/12 1345  HGB 9.5*  --  9.4*  --   HCT 29.3*  --  28.8*  --   PLT 238  --  260  --   HEPARINUNFRC 0.59  --   --   --   CREATININE 0.94  --  1.50*  --   TROPONINI  --  <0.30  --  2.66*    Estimated Creatinine Clearance: 23.7 ml/min (by C-G formula based on Cr of 1.5).   Medical History: Past Medical History  Diagnosis Date  . Coronary artery disease     a. h/o NSTEMI 2012 in setting of AFib/perf'd divertic (in [place]. - coded, CPR, intubation during hospital stay);  b.  MV 2/13: EF 57%, mod isch septum and poss small part of the Ant wall;  c.  LHC 04/06/11: diffuse 3v CAD no high grade lesions amenable to PCI, surgery would be incomplete thus med rx recommended. d. NSTEMI 02/2012 managed medically;  e. 08/2012 NSTEMI->Med Rx.  . Atrial fibrillation     in setting of sepsis in [place]; tx with amiodarone (amio stopped 04/20/11);    Marland Kitchen Injury to liver   . Allergic rhinitis due to pollen   . Hyperlipidemia   . Hypertension   . Vitamin D deficiency   . Anemia, unspecified   . Diabetes mellitus  borderline  . Diverticulitis of colon with perforation s/p colectomy/ostomy 13Oct2012 02/23/2011    s/p colostomy  . Carotid stenosis     a.  02/2011 - RICA 80-99% and LICA 60-79%;  b. s/p R CEA Dr. Darrick Penna 09/2011  . Ischemic cardiomyopathy     a.  Echo 10/2010 (in setting of NSTEMI at [facility] in [facility]):  mid ot apical septal AK, EF 40-45%, MAC, mild to mod MR, mild TR, no pulmo HTN;  b. Echo 02/2012: EF 45-50% with WMA, mild-mod MR.  Marland Kitchen PAD (peripheral artery disease)     a. ABIs 9/13: R 0.55; L 0.93;  b. arterial insufficiency ulcers on right foot;  followed by [facility];  c. angiogram 01/2012 with severe bilat SFA and pop disease => not good bypass candidate and PTA not an option => prob continue conservative mgmt  . Hypothyroid   . Fall at home Feb. 6, 2014    Left leg   . Complication of anesthesia     Allergie to  narcotics and abx.  . History of pneumonia     "a few times; always associated w/surgeries" (05/20/2012)  . GERD (gastroesophageal reflux disease)   . Peptic ulcer disease     04/2012  . Arthritis     "legs, arm, bones; Chelation therapy cleared all that" (05/20/2012)  . History of blood transfusion     "many" (05/20/2012)  . Toe ulcer, right     5th toe; due to PVD/notes 05/20/2012  . Chronic kidney disease     a. Stage III - IV.  Marland Kitchen Chest pain 08/2012  . Myocardial infarction     Assessment: 35 YOF who was just discharged on the 4th and is now readmitted with chest pain and N/V. Known history of inoperable CAD with 3 vessel disease. Initial troponin negative, but then spiked to 2.66.  CBC is stable from last admission. Platelets are WNL.  Goal of Therapy:  Heparin level 0.3-0.7 units/ml Monitor platelets by anticoagulation protocol: Yes   Plan:  1. Heparin bolus with 3000units IV x1 2. Start heparin drip at 700units/hr 3. Heparin level in 8 hours 4. Daily heparin level and CBC 5. Will obtain baseline PTT and PT/INR  Raegan Winders D. Charleston Hankin, PharmD Clinical Pharmacist Pager: [phone] 08/31/2012 3:43 PM

## 2012-08-31 NOTE — ED Notes (Addendum)
Pt in xray. Lab at door waiting for pt to return.

## 2012-08-31 NOTE — Progress Notes (Signed)
Patient ID: Julia Greene, female   DOB: Jul 03, 1930, 79 y.o.   MRN: [id number]  Chief Complaint  Patient presents with  . Hospitalization Follow-up   HPI 79 y/o female patient is here for post hospital follow up. She was admitted with chest pain and had NSTEMI. Cardiology was consulted. She was put on heparin drip, ranexa and ntg. She had a VQ scan showing low probability for PE. After having heparin fr > 48 hrs, her heparin was discontinued. She has history of coronary artery disease, hypertension, hyperlipidemia, diabetes, carotid stenosis, hypothyroidism, GERD, peripheral artery disease, carotid stenosis, cardiomyopathy, diverticulitis of the colon status post perforation and status post colostomy/ostomy in October of 2012 and non-ST elevation MI.  She was discharged from hospital yesterday. Hospital discharge summary reviewed She is here with her daughter in law. Denies any complaints. No further chest pain.  Has upcoming appointment with gi for mucus discharge from her rectum. She has colostomy   Review of Systems  Constitutional: Negative for fever, chills and appetite change.  HENT: Negative for congestion.   Respiratory: Negative for cough, chest tightness and shortness of breath.   Cardiovascular: Negative for chest pain, palpitations and leg swelling.  Gastrointestinal: Negative for nausea, vomiting, abdominal pain and blood in stool.        S/p colostomy  Musculoskeletal: Negative for back pain.  Skin: Positive for wound in right little toe that has healed for now. Negative for rash.  Neurological: Negative for dizziness and light-headedness.  Hematological: Negative for adenopathy.  Psychiatric/Behavioral: Negative for behavioral problems, sleep disturbance and agitation.    BP 110/62  Pulse 72  Temp(Src) 96.4 F (35.8 C) (Oral)  Resp 18  Ht 5\' 4"  (1.626 m)  Wt 134 lb 6.4 oz (60.963 kg)  BMI 23.06 kg/m2  SpO2 95%  Exam- gen- elderly, frail female patient in NAD heent-  no pallor or icterus, no LAD, mmm cvs- n s1, s2, rrr respi- b/l CTA abdo- bowel sound present, colostomy bag site clean Ext-Left small toe has healing wound. Poor dorsalis pedis Neuro-aao x 3, no focal deficit  imagings in hospital  Significant Diagnostic Studies:   Dg Chest Port 1 View  09-05-2012   *RADIOLOGY REPORT*  Clinical Data: Chest pain which radiated into both upper extremities earlier today.  Prior history of MI.  PORTABLE CHEST - 1 VIEW  Comparison: Portable chest x-ray 05/20/2012, 02/27/2012 and two- view chest x-ray 09/30/2011.  Findings: Cardiac silhouette upper normal in size for AP portable technique. Thoracic aorta atherosclerotic, unchanged.  Hilar and mediastinal contours otherwise unremarkable.  Lungs clear. Bronchovascular markings normal.  Pulmonary vascularity normal.  No pneumothorax.  No pleural effusions.  IMPRESSION: Stable borderline heart size.  No acute cardiopulmonary disease.   Original Report Authenticated By: Hulan Saas, M.D.   Assessment/plan  NSTEMI remains chest pain free. Continue asa, lopressor, NTG patch and ranexa  Diabetes  Continue metformin and monitor a1c  Hypertension  Stable. Continue home antihypertensive medications.   Hypothyroidism   Continue levothyroxine   GERD  Continue PPI.

## 2012-08-31 NOTE — H&P (Addendum)
[facility] History and Physical  Julia Greene EXB:[id number] DOB: 01-09-1931 DOA: 08/31/2012  Referring physician: Emergency Department PCP: Oneal Grout, MD  Specialists:   Chief Complaint: Chest pain  HPI: Julia Greene is a 79 y.o. female  With a pmh of afib, CAD, gerd, PAD who was recently discharged for NSTEMI, followed by Cardiology. The patient was noted to have CAD not amenable by PCI and was therefore started on Renexa and cont on medical mgt. The patient reported sudden onset nausea/vomiting associated with chest pain, diaphoresis, and numbness/tingling of both hands. The chest pain was described as similar to what she experienced when she had her NSTEMI one week ago. The patient reported taking nitrates with only some improvement of her symptoms. Presently the patient is asymptomatic.  Review of Systems: Chest pain, nausea/vomiting, diaphoresis, no syncope, remainder of 10pt ROS reviewed and is normal  Past Medical History  Diagnosis Date  . Coronary artery disease     a. h/o NSTEMI 2012 in setting of AFib/perf'd divertic (in [place]. - coded, CPR, intubation during hospital stay);  b.  MV 2/13: EF 57%, mod isch septum and poss small part of the Ant wall;  c.  LHC 04/06/11: diffuse 3v CAD no high grade lesions amenable to PCI, surgery would be incomplete thus med rx recommended. d. NSTEMI 02/2012 managed medically.  . Atrial fibrillation     in setting of sepsis in [place]; tx with amiodarone (amio stopped 04/20/11);    Marland Kitchen Injury to liver   . Allergic rhinitis due to pollen   . Hyperlipidemia   . Hypertension   . Vitamin D deficiency   . Anemia, unspecified   . Diabetes mellitus     borderline  . Diverticulitis of colon with perforation s/p colectomy/ostomy 13Oct2012 02/23/2011    s/p colostomy  . Carotid stenosis     a.  02/2011 - RICA 80-99% and LICA 60-79%;  b. s/p R CEA Dr. Darrick Penna 09/2011  . LV dysfunction     a.  Echo 10/2010 (in setting of NSTEMI at [facility] in [facility]):   mid ot apical septal AK, EF 40-45%, MAC, mild to mod MR, mild TR, no pulmo HTN;  b. Echo 02/2012: EF 45-50% with WMA, mild-mod MR.  Marland Kitchen PAD (peripheral artery disease)     a. ABIs 9/13: R 0.55; L 0.93;  b. arterial insufficiency ulcers on right foot;  followed by [facility];  c. angiogram 01/2012 with severe bilat SFA and pop disease => not good bypass candidate and PTA not an option => prob continue conservative mgmt  . Hypothyroid   . Fall at home Feb. 6, 2014    Left leg   . Complication of anesthesia     Allergie to narcotics and abx.  . Family history of anesthesia complication     "mother had allergies too" (05/20/2012)  . Anginal pain   . Pneumonia     "a few times; always associated w/surgeries" (05/20/2012)  . GERD (gastroesophageal reflux disease)   . Gastric ulcer     "just found these today" (05/20/2012)  . Arthritis     "legs, arm, bones; Chelation therapy cleared all that" (05/20/2012)  . History of blood transfusion     "many" (05/20/2012)  . Toe ulcer, right     5th toe; due to PVD/notes 05/20/2012  . Myocardial infarction ~ 1952; Feb. 1, 2014    "I've had a total of 8 as of 02/2012" (05/20/2012)  . NSTEMI (non-ST elevated myocardial infarction)  05/20/2012    [name] 05/20/2012  . Colostomy in place    Past Surgical History  Procedure Laterality Date  . Total hip arthroplasty Right 07/2004  . Colostomy  10/2010    for complex diverticular disease.  not done in [place]  . Cesarean section  1955, 1959, 1960  . Abdominal adhesion surgery  1976    at time of hysterectomy  . Endarterectomy  10/20/2011    Procedure: ENDARTERECTOMY CAROTID;  Surgeon: Sherren Kerns, MD;  Location: [facility] OR;  Service: Vascular;  Laterality: Right;  Right Carotid Endarterectomy with patch angioplasty  . Joint replacement  2006    Right  Hip  . Appendectomy  1943  . Abdominal hysterectomy  1976  . Combined hysterectomy abdominal w/ a&p repair / oophorectomy  1976    "left a piece of one ovary" 94/25/2014)  .  Cardiac catheterization    . Colostomy  10/2010  . Cataract extraction w/ intraocular lens  implant, bilateral Bilateral ~ 2013  . Dilation and curettage of uterus  1950's    "3-4 during childbirth time" (05/20/2012)  . Colectomy  10/2009    sigmoid/notes 05/20/2012  . Esophagogastroduodenoscopy N/A 05/20/2012    Procedure: ESOPHAGOGASTRODUODENOSCOPY (EGD);  Surgeon: Louis Meckel, MD;  Location: [facility] ENDOSCOPY;  Service: Endoscopy;  Laterality: N/A;   Social History:  reports that she has never smoked. She has never used smokeless tobacco. She reports that she does not drink alcohol or use illicit drugs.  where does patient live--home, ALF, SNF? and with whom if at home?  Can patient participate in ADLs?  Allergies  Allergen Reactions  . Aspirin Other (See Comments)    High doses of aspirin puts patient into COMA; currently taking low dose aspirin  . Benzoin Other (See Comments)    Puts patient into COMA  . Cephalexin Rash and Other (See Comments)    Puts patient into COMA  . Cephalosporins Other (See Comments)    Puts patient into COMA  . Codeine Swelling and Rash    CANNOT TAKE Injectable. Can take PO form  . Contrast Media (Iodinated Diagnostic Agents) Other (See Comments)    Causes SYNCOPE / HYPOTENSION  . Demerol Other (See Comments)    Puts patient into COMA  . Dilaudid (Hydromorphone Hcl) Other (See Comments)    Puts patient into COMA  . Insulins Other (See Comments)    "reacted violently to medications" unsure about what that means  . Morphine And Related Other (See Comments)    Puts patient into COMA  . Other Other (See Comments)    Puts patient into COMA.Patient highly sensitive to antibiotics, narcotics and any medication used to put patient to sleep for surgery.  . Penicillins Other (See Comments)    Puts patient into COMA  . Sulfa Antibiotics Other (See Comments)    Puts patient into COMA  . Valium (Diazepam) Other (See Comments)    VERY CRAZY FEELINGS AND  ACTIONS, CLIMBING THE WALLS TYPE REACTION  . Vistaril (Hydroxyzine Hcl) Other (See Comments)    CAUSES PATIENT  TO FEEL LIKE "CLIMBING THE WALLS TYPE OF REACTION  . Carafate (Sucralfate) Nausea And Vomiting  . Latex Itching and Rash  . Lisinopril Nausea And Vomiting and Cough    Family History  Problem Relation Age of Onset  . Heart disease Mother 76    Pacemaker, No CAD Heart Disease before age 65  . Diverticulitis Mother   . Diabetes Mother   . Hyperlipidemia Mother   .  Hypertension Mother   . Lung disease Father 73    Smoking  . Ovarian cancer Daughter   . Cancer Daughter   . Colon cancer Neg Hx   . Heart disease Maternal Grandmother   . Diabetes Maternal Aunt     x 2  . Diabetes Sister   . Heart disease Sister     Heart Disease before age 32  . Hyperlipidemia Sister   . Hypertension Sister     (be sure to complete)  Prior to Admission medications   Medication Sig Start Date End Date Taking? Authorizing Provider  acetaminophen-codeine (TYLENOL #3) 300-30 MG per tablet Take 1-2 tablets by mouth every 4 (four) hours as needed for pain.   Yes Historical Provider, MD  aspirin EC 81 MG tablet Take 81 mg by mouth daily.  04/20/11  Yes Beatrice Lecher, PA-C  DiphenhydrAMINE HCl (BENADRYL PO) Take 30 mLs by mouth as needed. For cough   Yes Historical Provider, MD  ezetimibe (ZETIA) 10 MG tablet Take 1 tablet (10 mg total) by mouth daily. 04/06/12  Yes Rollene Rotunda, MD  furosemide (LASIX) 20 MG tablet Take 1 tablet (20 mg total) by mouth daily. 07/05/12  Yes Mahima Pandey, MD  levothyroxine (SYNTHROID, LEVOTHROID) 25 MCG tablet Take 25 mcg by mouth daily.   Yes Historical Provider, MD  Melatonin 5 MG CAPS Take 10 mg by mouth at bedtime as needed. To help sleep   Yes Historical Provider, MD  metoprolol (LOPRESSOR) 50 MG tablet Take 0.5 tablets (25 mg total) by mouth 2 (two) times daily. 05/23/12  Yes Nishant Dhungel, MD  Multiple Vitamins-Minerals (ADULT GUMMY PO) Take 2 tablets by  mouth daily.   Yes Historical Provider, MD  naproxen sodium (ANAPROX) 220 MG tablet Take 220 mg by mouth as needed.    Yes Historical Provider, MD  nitroGLYCERIN (NITRODUR - DOSED IN MG/24 HR) 0.4 mg/hr patch Place 1 patch (0.4 mg total) onto the skin daily. 08/29/12  Yes Ripudeep Jenna Luo, MD  nitroGLYCERIN (NITROSTAT) 0.4 MG SL tablet Place 0.4 mg under the tongue every 5 (five) minutes as needed for chest pain.   Yes Historical Provider, MD  pantoprazole (PROTONIX) 40 MG tablet Take 40 mg by mouth daily.   Yes Historical Provider, MD  ranolazine (RANEXA) 500 MG 12 hr tablet Take 1 tablet (500 mg total) by mouth 2 (two) times daily. 08/29/12  Yes Ripudeep Jenna Luo, MD  saccharomyces boulardii (FLORASTOR) 250 MG capsule Take 1 capsule (250 mg total) by mouth 2 (two) times daily. 07/05/12  Yes Oneal Grout, MD   Physical Exam: Filed Vitals:   08/31/12 0800 08/31/12 0830 08/31/12 0900 08/31/12 0934  BP: 129/50 114/36 118/63 134/56  Pulse: 74 66 68 61  Temp:    97.5 F (36.4 C)  TempSrc:    Oral  Resp: 18 13 15 18   Height:    5\' 2"  (1.575 m)  Weight:    58.9 kg (129 lb 13.6 oz)  SpO2: 100% 100% 98% 97%     General:  Awake, in nad  Eyes: PERRL B  ENT: Membranes moist, dentition fair  Neck: trachea midline, neck supple  Cardiovascular: regular, s1, s2  Respiratory: normal resp effort, no wheezing or crackles  Abdomen: soft, nondistended  Skin: no abnormal skin lesions seen  Musculoskeletal: perfused, no clubbing or cynanosis  Psychiatric: mood/affect normal, no audtitory/visual hallucinations  Neurologic: cn2-12 grossly in tact, strength/sensation intact  Labs on Admission:  Basic Metabolic Panel:  Recent Labs  Lab 08/26/12 1559 08/26/12 1613 08/26/12 2205 08/27/12 0405 08/28/12 0415 08/29/12 0720 08/31/12 0700  NA 140 140  --  139 137 137 136  K 4.3 4.3  --  3.9 3.9 4.4 4.2  CL 102 105  --  101 104 103 101  CO2 26  --   --  27 24 26 24   GLUCOSE 158* 160*  --  166* 125*  133* 140*  BUN 37* 35*  --  31* 18 16 32*  CREATININE 1.33* 1.50* 1.25* 1.18* 0.94 0.94 1.50*  CALCIUM 9.1  --   --  8.7 9.0 9.3 9.4   Liver Function Tests: No results found for this basename: AST, ALT, ALKPHOS, BILITOT, PROT, ALBUMIN,  in the last 168 hours No results found for this basename: LIPASE, AMYLASE,  in the last 168 hours No results found for this basename: AMMONIA,  in the last 168 hours CBC:  Recent Labs Lab 08/26/12 1559  08/26/12 2205 08/27/12 0405 08/28/12 0415 08/29/12 0720 08/31/12 0700  WBC 8.4  --  9.2 9.9 9.2 8.4 11.0*  NEUTROABS 4.9  --   --   --   --   --   --   HGB 9.3*  < > 9.7* 8.9* 9.6* 9.5* 9.4*  HCT 28.4*  < > 29.7* 27.2* 28.5* 29.3* 28.8*  MCV 83.3  --  83.2 83.4 83.3 84.2 84.0  PLT 263  --  246 228 247 238 260  < > = values in this interval not displayed. Cardiac Enzymes:  Recent Labs Lab 08/27/12 0405 08/27/12 0905 08/27/12 1429 08/27/12 2032 08/31/12 0655  TROPONINI 0.52* <0.30 <0.30 <0.30 <0.30    BNP (last 3 results)  Recent Labs  05/20/12 0503 08/26/12 1559 08/31/12 0655  PROBNP 1481.0* 1401.0* 1924.0*   CBG:  Recent Labs Lab 08/26/12 2105  GLUCAP 88    Radiological Exams on Admission: Dg Chest 2 View  08/31/2012   *RADIOLOGY REPORT*  Clinical Data: Chest pain, nausea and vomiting.  CHEST - 2 VIEW  Comparison: PA and lateral chest 08/28/2012 and single view of the chest 05/21/2011.  Findings: The chest is hyperexpanded.  Heart size is normal. Kerley B lines are identified.  No consolidative process is seen. No pneumothorax or pleural fluid.  IMPRESSION:  1.  Findings compatible with mild interstitial edema. 2.  Pulmonary hyperexpansion suggestive of emphysema.   Original Report Authenticated By: Holley Dexter, M.D.    Assessment/Plan Principal Problem:   Chest pain Active Problems:   Atrial fibrillation   Myocardial infarction   HTN (hypertension)   Coronary artery disease   Diabetes mellitus   GERD  (gastroesophageal reflux disease)   Unspecified hypothyroidism   Chest pain: - See recent d/c summary. Pt was recently admitted for NSTEMI, seen by Cardiology with recs for med mgt and started ranexa. - Consider ?anginal pain vs noncardiogenic pain such as GERD, esophagitis, gastritis - Thus far Cardiac enzymes neg x 1 - EKG unremarkable - Will cycle cardiac biomarkers and keep on tele - Cont current cardiac meds, including NTG and ranexa - Consider repeat Cardiology consult for poss anginal med adjustment  A. Fib: - Rate controlled. - Cont beta blocker  HTN: - BP stable - Cont meds  GERD: - Cont PPI  Diabetes: - recent a1c of 7 - hx of "reacting violently" to insulin. Hold off on SSI for now  Hypothyroid: - Will cont thyroid replacement  Code Status: Full (must indicate code status--if unknown or must be presumed, indicate  so) Family Communication: Pt in room (indicate person spoken with, if applicable, with phone number if by telephone) Disposition Plan: Pending (indicate anticipated LOS)  Time spent:  CHIU, STEPHEN K [facility] Pager [phone]  If 7PM-7AM, please contact night-coverage [email or web address] Password [email or web address] 08/31/2012, 10:13 AM

## 2012-08-31 NOTE — Consult Note (Signed)
CARDIOLOGY CONSULT NOTE  Patient ID: Julia Greene MRN: [id number], DOB/AGE: 1930-05-10   Admit date: 08/31/2012 Date of Consult: 08/31/2012  Primary Physician: Oneal Grout, MD Primary Cardiologist: Shela Commons. Hochrein, MD   Pt. Profile  79 y/o female with h/o severe, 3v CAD who was just discharged on Monday and returned last night 2/2 n/v and chest pain.  Problem List  Past Medical History  Diagnosis Date  . Coronary artery disease     a. h/o NSTEMI 2012 in setting of AFib/perf'd divertic (in [place]. - coded, CPR, intubation during hospital stay);  b.  MV 2/13: EF 57%, mod isch septum and poss small part of the Ant wall;  c.  LHC 04/06/11: diffuse 3v CAD no high grade lesions amenable to PCI, surgery would be incomplete thus med rx recommended. d. NSTEMI 02/2012 managed medically;  e. 08/2012 NSTEMI->Med Rx.  . Atrial fibrillation     in setting of sepsis in ; tx with amiodarone (amio stopped 04/20/11);    Marland Kitchen Injury to liver   . Allergic rhinitis due to pollen   . Hyperlipidemia   . Hypertension   . Vitamin D deficiency   . Anemia, unspecified   . Diabetes mellitus     borderline  . Diverticulitis of colon with perforation s/p colectomy/ostomy 13Oct2012 02/23/2011    s/p colostomy  . Carotid stenosis     a.  02/2011 - RICA 80-99% and LICA 60-79%;  b. s/p R CEA Dr. Darrick Penna 09/2011  . Ischemic cardiomyopathy     a.  Echo 10/2010 (in setting of NSTEMI at [facility] in [facility]):  mid ot apical septal AK, EF 40-45%, MAC, mild to mod MR, mild TR, no pulmo HTN;  b. Echo 02/2012: EF 45-50% with WMA, mild-mod MR.  Marland Kitchen PAD (peripheral artery disease)     a. ABIs 9/13: R 0.55; L 0.93;  b. arterial insufficiency ulcers on right foot;  followed by [facility];  c. angiogram 01/2012 with severe bilat SFA and pop disease => not good bypass candidate and PTA not an option => prob continue conservative mgmt  . Hypothyroid   . Fall at home Feb. 6, 2014    Left leg   . Complication of anesthesia     Allergie to  narcotics and abx.  . History of pneumonia     "a few times; always associated w/surgeries" (05/20/2012)  . GERD (gastroesophageal reflux disease)   . Peptic ulcer disease     04/2012  . Arthritis     "legs, arm, bones; Chelation therapy cleared all that" (05/20/2012)  . History of blood transfusion     "many" (05/20/2012)  . Toe ulcer, right     5th toe; due to PVD/notes 05/20/2012  . Chronic kidney disease     a. Stage III - IV.    Past Surgical History  Procedure Laterality Date  . Total hip arthroplasty Right 07/2004  . Colostomy  10/2010    for complex diverticular disease.  not done in [place]  . Cesarean section  1955, 1959, 1960  . Abdominal adhesion surgery  1976    at time of hysterectomy  . Endarterectomy  10/20/2011    Procedure: ENDARTERECTOMY CAROTID;  Surgeon: Sherren Kerns, MD;  Location: [facility] OR;  Service: Vascular;  Laterality: Right;  Right Carotid Endarterectomy with patch angioplasty  . Joint replacement  2006    Right  Hip  . Appendectomy  1943  . Abdominal hysterectomy  1976  . Combined hysterectomy abdominal  w/ a&p repair / oophorectomy  1976    "left a piece of one ovary" 94/25/2014)  . Cardiac catheterization    . Colostomy  10/2010  . Cataract extraction w/ intraocular lens  implant, bilateral Bilateral ~ 2013  . Dilation and curettage of uterus  1950's    "3-4 during childbirth time" (05/20/2012)  . Colectomy  10/2009    sigmoid/notes 05/20/2012  . Esophagogastroduodenoscopy N/A 05/20/2012    Procedure: ESOPHAGOGASTRODUODENOSCOPY (EGD);  Surgeon: Louis Meckel, MD;  Location: [facility] ENDOSCOPY;  Service: Endoscopy;  Laterality: N/A;    Allergies  Allergies  Allergen Reactions  . Aspirin Other (See Comments)    High doses of aspirin puts patient into COMA; currently taking low dose aspirin  . Benzoin Other (See Comments)    Puts patient into COMA  . Cephalexin Rash and Other (See Comments)    Puts patient into COMA  . Cephalosporins Other (See Comments)      Puts patient into COMA  . Codeine Swelling and Rash    CANNOT TAKE Injectable. Can take PO form  . Contrast Media (Iodinated Diagnostic Agents) Other (See Comments)    Causes SYNCOPE / HYPOTENSION  . Demerol Other (See Comments)    Puts patient into COMA  . Dilaudid (Hydromorphone Hcl) Other (See Comments)    Puts patient into COMA  . Insulins Other (See Comments)    "reacted violently to medications" unsure about what that means  . Morphine And Related Other (See Comments)    Puts patient into COMA  . Other Other (See Comments)    Puts patient into COMA.Patient highly sensitive to antibiotics, narcotics and any medication used to put patient to sleep for surgery.  . Penicillins Other (See Comments)    Puts patient into COMA  . Sulfa Antibiotics Other (See Comments)    Puts patient into COMA  . Valium (Diazepam) Other (See Comments)    VERY CRAZY FEELINGS AND ACTIONS, CLIMBING THE WALLS TYPE REACTION  . Vistaril (Hydroxyzine Hcl) Other (See Comments)    CAUSES PATIENT  TO FEEL LIKE "CLIMBING THE WALLS TYPE OF REACTION  . Carafate (Sucralfate) Nausea And Vomiting  . Latex Itching and Rash  . Lisinopril Nausea And Vomiting and Cough   HPI   Julia Greene is a 79 yo female with history of artherosclerosis and CAD, CKD. She was discharged from the hospital Monday and placed on Ranexa therapy for known inoperable CAD. She had fatigue early Tuesday morning after her hospital follow-up visit with her PCP and took Ranexa around 9:30am and experienced some nausea but no chest discomfort after taking it. Around 9:30pm she took the medication and had nausea and vomiting 30 minutes later which lasted well into the morning. Around 3am she felt progressively worsening chest pain, described as intense pressure, and which grew to 9/10. The pain was constant and radiated from her sternum to her shoulders and eventually her neck. She took three nitroglycerin, which in the past has relieved the chest  pain, which provided only minimal relief. EMS arrived and she received heparin and a nitroglycerin drip when she got to the hospital. The duration of her chest pain was 5 hours before her symptoms let up, which was after the nitro drip and heparin were administered. She states she did not experience palpitations, shortness of breath, leg swelling, pain worsening with breathing/coughing/movement, or fever/chills.  Inpatient Medications  . aspirin EC  81 mg Oral Daily  . enoxaparin (LOVENOX) injection  30 mg Subcutaneous Q24H  .  ezetimibe  10 mg Oral Daily  . furosemide  20 mg Oral Daily  . [START ON 09/01/2012] levothyroxine  25 mcg Oral QAC breakfast  . metoprolol  25 mg Oral BID  . nitroGLYCERIN  0.4 mg Transdermal Q24H  . pantoprazole  40 mg Oral Daily  . ranolazine  500 mg Oral BID  . saccharomyces boulardii  250 mg Oral BID  . sodium chloride  3 mL Intravenous Q12H   Family History Family History  Problem Relation Age of Onset  . Heart disease Mother 50    Pacemaker, No CAD Heart Disease before age 60  . Diverticulitis Mother   . Diabetes Mother   . Hyperlipidemia Mother   . Hypertension Mother   . Lung disease Father 39    Smoking  . Ovarian cancer Daughter   . Cancer Daughter   . Colon cancer Neg Hx   . Heart disease Maternal Grandmother   . Diabetes Maternal Aunt     x 2  . Diabetes Sister   . Heart disease Sister     Heart Disease before age 40  . Hyperlipidemia Sister   . Hypertension Sister     Social History History   Social History  . Marital Status: Widowed    Spouse Name: N/A    Number of Children: N/A  . Years of Education: N/A   Occupational History  . Not on file.   Social History Main Topics  . Smoking status: Never Smoker   . Smokeless tobacco: Never Used  . Alcohol Use: No  . Drug Use: No  . Sexually Active: No   Other Topics Concern  . Not on file   Social History Narrative  . No narrative on file    Review of Systems  General:  No  chills, fever, night sweats or weight changes.  Cardiovascular:  +++ chest pain, no dyspnea on exertion, edema, orthopnea, palpitations, paroxysmal nocturnal dyspnea. Dermatological: No rash, lesions/masses Respiratory: No cough, dyspnea Urologic: No hematuria, dysuria Abdominal:   +++ nausea, vomiting, no diarrhea, bright red blood per rectum, melena, or hematemesis Neurologic:  No visual changes, wkns, changes in mental status. All other systems reviewed and are otherwise negative except as noted above.  Physical Exam  Blood pressure 134/56, pulse 61, temperature 97.5 F (36.4 C), temperature source Oral, resp. rate 18, height 5\' 2"  (1.575 m), weight 129 lb 13.6 oz (58.9 kg), SpO2 97.00%.  General: Pleasant, NAD.  Talkative. Psych: Normal affect. Neuro: Alert and oriented X 3. Moves all extremities spontaneously. HEENT: Normal  Neck: Supple without bruits or JVD. Lungs:  Resp regular and unlabored, CTA. Heart: RRR no s3, s4, 3/6 syst murmur @ apex, radiating to axilla. Abdomen: Soft, non-tender, non-distended, BS + x 4.  Extremities: No clubbing, cyanosis or edema. DP/PT/Radials 1+ and equal bilaterally.  Labs   Recent Labs  08/31/12 0655  TROPONINI <0.30   Lab Results  Component Value Date   WBC 11.0* 08/31/2012   HGB 9.4* 08/31/2012   HCT 28.8* 08/31/2012   MCV 84.0 08/31/2012   PLT 260 08/31/2012    Recent Labs Lab 08/31/12 0700  NA 136  K 4.2  CL 101  CO2 24  BUN 32*  CREATININE 1.50*  CALCIUM 9.4  GLUCOSE 140*   Lab Results  Component Value Date   CHOL 232* 05/21/2012   HDL 35* 05/21/2012   LDLCALC 141* 05/21/2012   TRIG 282* 05/21/2012   Lab Results  Component Value Date   DDIMER  2.22* 08/28/2012   Radiology/Studies  Dg Chest 2 View  08/31/2012   *RADIOLOGY REPORT*  Clinical Data: Chest pain, nausea and vomiting.  CHEST - 2 VIEW  Comparison: PA and lateral chest 08/28/2012 and single view of the chest 05/21/2011.  Findings: The chest is hyperexpanded.  Heart  size is normal. Kerley B lines are identified.  No consolidative process is seen. No pneumothorax or pleural fluid.  IMPRESSION:  1.  Findings compatible with mild interstitial edema. 2.  Pulmonary hyperexpansion suggestive of emphysema.   Original Report Authenticated By: Holley Dexter, M.D.   ECG  None available.  ASSESSMENT AND PLAN  1.  [place]/CAD:  S/p recent NSTEMI, which was medically managed.  She was placed on Ranexa however this appears to have caused nausea and vomiting.  She has multiple intolerances and it appears this is another one.  She has known severe 3VD and is not a candidate for intervention.  CE are currently negative.  There are no ECG's either on the chart or in Epic from this admission.  Will order now.  She is currently pain free and is otherwise on bb/nitropatch and tolerates both but is fearsome that further titration of bb may result in an adverse rxn.  She is willing to try a higher dose of ntg patch and thus we will increase to 0.6mg  daily.  OTW cont asa, statin, bb.  2.  ICM:  Stable volume.  Cont bb.  Intolerant to ACEI.  Will not try ARB given multiple intolerances.  3.  HTN:  Stable.  4.  Stage III CKD:  Bun/creat acutely elevated in setting of n/v.  Follow.  Signed, Nicolasa Ducking, NP 08/31/2012, 11:14 AM Patient seen and examined and history reviewed. Agree with above findings and plan. Patient well known to me from prior admit. Very interesting history with multiple drug intolerances. Developed acute nausea and vomiting after taking second dose of Ranexa. She has known severe CAD with severe ostial LAD disease followed by mid vessel occlusion. The LCx and RCA are also occluded. A large ramus branch supplies extensive collaterals. She is not a candidate for PCI. She has mod-severe MR. Exam is remarkable for 3/6 systolic murmur at apex>>left axilla. Lungs are clear. Cardiac enzymes are negative. Ecg is pending. I think acute N/V are related to Ranexa. I would  favor either increasing nitropatch or metoprolol. Patient is worried about taking more metoprolol so we will increase nitropatch. Otherwise continue current therapy.  Theron Arista [facility] 08/31/2012 1:24 PM

## 2012-08-31 NOTE — ED Provider Notes (Signed)
  Medical screening examination/treatment/procedure(s) were performed by non-physician practitioner and as supervising physician I was immediately available for consultation/collaboration.    Gerhard Munch, MD 08/31/12 [phone]

## 2012-08-31 NOTE — ED Notes (Signed)
Sanders, PA at bedside for evaluation. 

## 2012-09-01 DIAGNOSIS — E039 Hypothyroidism, unspecified: Secondary | ICD-10-CM

## 2012-09-01 DIAGNOSIS — I739 Peripheral vascular disease, unspecified: Secondary | ICD-10-CM

## 2012-09-01 DIAGNOSIS — I4891 Unspecified atrial fibrillation: Secondary | ICD-10-CM

## 2012-09-01 DIAGNOSIS — I255 Ischemic cardiomyopathy: Secondary | ICD-10-CM | POA: Diagnosis present

## 2012-09-01 DIAGNOSIS — R112 Nausea with vomiting, unspecified: Secondary | ICD-10-CM | POA: Diagnosis present

## 2012-09-01 DIAGNOSIS — I214 Non-ST elevation (NSTEMI) myocardial infarction: Secondary | ICD-10-CM

## 2012-09-01 LAB — COMPREHENSIVE METABOLIC PANEL
Albumin: 2.7 g/dL — ABNORMAL LOW (ref 3.5–5.2)
Alkaline Phosphatase: 56 U/L (ref 39–117)
BUN: 22 mg/dL (ref 6–23)
Chloride: 105 mEq/L (ref 96–112)
Glucose, Bld: 125 mg/dL — ABNORMAL HIGH (ref 70–99)
Potassium: 4 mEq/L (ref 3.5–5.1)
Total Bilirubin: 0.1 mg/dL — ABNORMAL LOW (ref 0.3–1.2)

## 2012-09-01 LAB — GLUCOSE, CAPILLARY
Glucose-Capillary: 118 mg/dL — ABNORMAL HIGH (ref 70–99)
Glucose-Capillary: 115 mg/dL — ABNORMAL HIGH (ref 70–99)
Glucose-Capillary: 160 mg/dL — ABNORMAL HIGH (ref 70–99)
Glucose-Capillary: 120 mg/dL — ABNORMAL HIGH (ref 70–99)

## 2012-09-01 LAB — HEPARIN LEVEL (UNFRACTIONATED): Heparin Unfractionated: 0.53 IU/mL (ref 0.30–0.70)

## 2012-09-01 LAB — CBC
MCV: 84.1 fL (ref 78.0–100.0)
Platelets: 252 10*3/uL (ref 150–400)
RDW: 18.5 % — ABNORMAL HIGH (ref 11.5–15.5)
WBC: 9.9 10*3/uL (ref 4.0–10.5)

## 2012-09-01 LAB — TROPONIN I: Troponin I: 2.53 ng/mL (ref <0.30)

## 2012-09-01 MED ORDER — HEPARIN BOLUS VIA INFUSION
1000.0000 [IU] | Freq: Once | INTRAVENOUS | Status: AC
  Administered 2012-09-01: 1000 [IU] via INTRAVENOUS
  Filled 2012-09-01: qty 1000

## 2012-09-01 MED ORDER — CHLORHEXIDINE GLUCONATE CLOTH 2 % EX PADS
6.0000 | MEDICATED_PAD | Freq: Every day | CUTANEOUS | Status: DC
  Administered 2012-09-01 – 2012-09-04 (×4): 6 via TOPICAL

## 2012-09-01 MED ORDER — MUPIROCIN 2 % EX OINT
1.0000 "application " | TOPICAL_OINTMENT | Freq: Two times a day (BID) | CUTANEOUS | Status: DC
  Administered 2012-09-01 – 2012-09-03 (×5): 1 via NASAL
  Filled 2012-09-01: qty 22

## 2012-09-01 MED ORDER — CODEINE SULFATE 15 MG PO TABS: 15.0000 mg | ORAL_TABLET | Freq: Four times a day (QID) | ORAL | Status: DC | PRN

## 2012-09-01 NOTE — Progress Notes (Signed)
Chart reviewed.  [facility] PROGRESS NOTE  Julia Greene ZOX:[id number] DOB: 11/07/1930 DOA: 08/31/2012 PCP: Oneal Grout, MD  Assessment/Plan:  Principal Problem:   NSTEMI (non-ST elevated myocardial infarction), recurrent. Medical management per cardiology. No further chest pain Active Problems:   Coronary artery disease   HTN (hypertension)   Diabetes mellitus   Cardiomyopathy, ischemic, EF 45%: No clinical signs of CHF. Pro BNP is chronically elevated   GERD (gastroesophageal reflux disease)   Unspecified hypothyroidism Nausea vomiting resolved. Likely related to ranexa  Code Status: full Family Communication: none available Disposition Plan: home   Consultants:  cardiology  Procedures:    Antibiotics:    HPI/Subjective: Had chest pain last night. Relieved with a single nitroglycerin. No chest pain or shortness of breath today. No further vomiting.  Objective: Filed Vitals:   09/01/12 1024  BP: 108/50  Pulse:   Temp:   Resp:     Intake/Output Summary (Last 24 hours) at 09/01/12 1256 Last data filed at 09/01/12 0800  Gross per 24 hour  Intake 2565.61 ml  Output   2200 ml  Net 365.61 ml   Filed Weights   08/31/12 0615 08/31/12 0934  Weight: 60.782 kg (134 lb) 58.9 kg (129 lb 13.6 oz)    Exam:   General:  Talkative. Alert oriented. Comfortable.  Cardiovascular: Regular rate rhythm without murmurs gallops or a  Respiratory: Clear to auscultation bilaterally without wheezes rhonchi or rales. Breathing nonlabored per  Abdomen: Normal bowel sounds. Soft, nontender, nondistended.  Ext:  No CCE  Data Reviewed: Basic Metabolic Panel:  Recent Labs Lab 08/27/12 0405 08/28/12 0415 08/29/12 0720 08/31/12 0700 09/01/12 0525  NA 139 137 137 136 140  K 3.9 3.9 4.4 4.2 4.0  CL 101 104 103 101 105  CO2 27 24 26 24 25   GLUCOSE 166* 125* 133* 140* 125*  BUN 31* 18 16 32* 22  CREATININE 1.18* 0.94 0.94 1.50* 1.17*  CALCIUM 8.7 9.0 9.3 9.4  8.8   Liver Function Tests:  Recent Labs Lab 09/01/12 0525  AST 25  ALT 9  ALKPHOS 56  BILITOT 0.1*  PROT 5.7*  ALBUMIN 2.7*   No results found for this basename: LIPASE, AMYLASE,  in the last 168 hours No results found for this basename: AMMONIA,  in the last 168 hours CBC:  Recent Labs Lab 08/26/12 1559  08/27/12 0405 08/28/12 0415 08/29/12 0720 08/31/12 0700 09/01/12 0525  WBC 8.4  < > 9.9 9.2 8.4 11.0* 9.9  NEUTROABS 4.9  --   --   --   --   --   --   HGB 9.3*  < > 8.9* 9.6* 9.5* 9.4* 9.0*  HCT 28.4*  < > 27.2* 28.5* 29.3* 28.8* 26.9*  MCV 83.3  < > 83.4 83.3 84.2 84.0 84.1  PLT 263  < > 228 247 238 260 252  < > = values in this interval not displayed. Cardiac Enzymes:  Recent Labs Lab 08/27/12 2032 08/31/12 0655 08/31/12 1345 08/31/12 1640 09/01/12 0035  TROPONINI <0.30 <0.30 2.66* 3.08* 2.53*   BNP (last 3 results)  Recent Labs  05/20/12 0503 08/26/12 1559 08/31/12 0655  PROBNP 1481.0* 1401.0* 1924.0*   CBG:  Recent Labs Lab 08/26/12 2105 08/31/12 1151 08/31/12 2114 09/01/12 0604 09/01/12 1122  GLUCAP 88 88 118* 115* 167*    Recent Results (from the past 240 hour(s))  MRSA PCR SCREENING     Status: Abnormal   Collection Time    08/26/12  9:52  PM      Result Value Range Status   MRSA by PCR POSITIVE (*) NEGATIVE Final   Comment:            The GeneXpert MRSA Assay (FDA     approved for NASAL specimens     only), is one component of a     comprehensive MRSA colonization     surveillance program. It is not     intended to diagnose MRSA     infection nor to guide or     monitor treatment for     MRSA infections.     RESULT CALLED TO, READ BACK BY AND VERIFIED WITH:     C.RALPH RN AT 0103 08/27/12 [email or web address]     Studies: Dg Chest 2 View  08/31/2012   *RADIOLOGY REPORT*  Clinical Data: Chest pain, nausea and vomiting.  CHEST - 2 VIEW  Comparison: PA and lateral chest 08/28/2012 and single view of the chest 05/21/2011.  Findings: The  chest is hyperexpanded.  Heart size is normal. Kerley B lines are identified.  No consolidative process is seen. No pneumothorax or pleural fluid.  IMPRESSION:  1.  Findings compatible with mild interstitial edema. 2.  Pulmonary hyperexpansion suggestive of emphysema.   Original Report Authenticated By: Holley Dexter, M.D.    Scheduled Meds: . aspirin EC  81 mg Oral Daily  . Chlorhexidine Gluconate Cloth  6 each Topical Q0600  . ezetimibe  10 mg Oral Daily  . furosemide  20 mg Oral Daily  . levothyroxine  25 mcg Oral QAC breakfast  . metoprolol  25 mg Oral BID  . mupirocin ointment  1 application Nasal BID  . nitroGLYCERIN  0.6 mg Transdermal Q24H  . pantoprazole  40 mg Oral Daily  . saccharomyces boulardii  250 mg Oral BID  . sodium chloride  3 mL Intravenous Q12H   Continuous Infusions: . sodium chloride 75 mL/hr at 08/31/12 1111  . heparin 800 Units/hr (09/01/12 0150)   Time spent: 35 minutes  Julia Greene  [facility] Pager [phone]. If 7PM-7AM, please contact night-coverage at [email or web address], password [facility] 09/01/2012, 12:56 PM  LOS: 1 day

## 2012-09-01 NOTE — Progress Notes (Signed)
TELEMETRY: Reviewed telemetry pt in NSR  Filed Vitals:   08/31/12 2346 08/31/12 2358 09/01/12 0015 09/01/12 0417  BP: 98/38 99/36 104/51 118/46  Pulse: 67 64 67 64  Temp:    98.4 F (36.9 C)  TempSrc:    Oral  Resp:    16  Height:      Weight:      SpO2:  100% 99% 100%    Intake/Output Summary (Last 24 hours) at 09/01/12 0805 Last data filed at 09/01/12 0631  Gross per 24 hour  Intake 2565.61 ml  Output   2200 ml  Net 365.61 ml    SUBJECTIVE Patient had recurrent chest pain and pressure last night. Reports it was relieved with one sl Ntg. Cardiac enzymes positive. Patient started on IV heparin.  LABS: Basic Metabolic Panel:  Recent Labs  16/10/96 0700 09/01/12 0525  NA 136 140  K 4.2 4.0  CL 101 105  CO2 24 25  GLUCOSE 140* 125*  BUN 32* 22  CREATININE 1.50* 1.17*  CALCIUM 9.4 8.8   Liver Function Tests:  Recent Labs  09/01/12 0525  AST 25  ALT 9  ALKPHOS 56  BILITOT 0.1*  PROT 5.7*  ALBUMIN 2.7*   CBC:  Recent Labs  08/31/12 0700 09/01/12 0525  WBC 11.0* 9.9  HGB 9.4* 9.0*  HCT 28.8* 26.9*  MCV 84.0 84.1  PLT 260 252   Cardiac Enzymes:  Recent Labs  08/31/12 1345 08/31/12 1640 09/01/12 0035  TROPONINI 2.66* 3.08* 2.53*    Radiology/Studies:  Dg Chest 2 View  08/31/2012   *RADIOLOGY REPORT*  Clinical Data: Chest pain, nausea and vomiting.  CHEST - 2 VIEW  Comparison: PA and lateral chest 08/28/2012 and single view of the chest 05/21/2011.  Findings: The chest is hyperexpanded.  Heart size is normal. Kerley B lines are identified.  No consolidative process is seen. No pneumothorax or pleural fluid.  IMPRESSION:  1.  Findings compatible with mild interstitial edema. 2.  Pulmonary hyperexpansion suggestive of emphysema.   Original Report Authenticated By: Holley Dexter, M.D.   Dg Chest 2 View  08/28/2012   *RADIOLOGY REPORT*  Clinical Data: Chest pain  CHEST - 2 VIEW  Comparison: 08/26/2012  Findings: Increased interstitial markings.   No frank interstitial edema.  No pleural effusion or pneumothorax.  The heart is normal in size.  Mild superior endplate changes involving a lower thoracic vertebral body, likely T12, unchanged from 2013.  IMPRESSION: No evidence of acute cardiopulmonary disease.   Original Report Authenticated By: Charline Bills, M.D.   Nm Pulmonary Perf And Vent  08/28/2012   *RADIOLOGY REPORT*  Clinical Data:  Chest pain and shortness of breath.  NUCLEAR MEDICINE VENTILATION - PERFUSION LUNG SCAN  Technique:  Wash-in, equilibrium, and wash-out phase ventilation images were obtained using Xe-133 gas.  Perfusion images were obtained in multiple projections after intravenous injection of Tc- 72m MAA.  Radiopharmaceuticals:  40 mCi aerosolized technetium DTPA and 6.0 mCi Tc-22m MAA.  Comparison:  Chest x-ray 09/24/2012  Findings: The ventilation scan demonstrates slight decreased ventilation in the upper lobes bilaterally.  Central clumping of the radiopharmaceutical is noted.  No large segmental defects are demonstrated here  The perfusion scan demonstrates patchy E non segmental perfusion defects and both lungs.  Findings are most likely due to emphysema. I do not see any definite segmental or subsegmental defects to suggest pulmonary embolism but because of the diffuse abnormality this is considered a low probability study for pulmonary embolism.  IMPRESSION: Low probability ventilation perfusion lung scan for pulmonary embolism.   Original Report Authenticated By: Rudie Meyer, M.D.   Dg Chest Port 1 View  08/26/2012   *RADIOLOGY REPORT*  Clinical Data: Chest pain which radiated into both upper extremities earlier today.  Prior history of MI.  PORTABLE CHEST - 1 VIEW  Comparison: Portable chest x-ray 05/20/2012, 02/27/2012 and two- view chest x-ray 09/30/2011.  Findings: Cardiac silhouette upper normal in size for AP portable technique.  Thoracic aorta atherosclerotic, unchanged.  Hilar and mediastinal contours otherwise  unremarkable.  Lungs clear. Bronchovascular markings normal.  Pulmonary vascularity normal.  No pneumothorax.  No pleural effusions.  IMPRESSION: Stable borderline heart size.  No acute cardiopulmonary disease.   Original Report Authenticated By: Hulan Saas, M.D.   Ecg: NSR with LAD, ? Old inferior infarct. Compared to 08/26/12 there is mild ST depression in the lateral leads.  PHYSICAL EXAM General: Elderly, pale, in no acute distress. Head: Normal Neck: Negative for carotid bruits. JVD not elevated. Lungs: Clear bilaterally to auscultation without wheezes, rales, or rhonchi. Breathing is unlabored. Heart: RRR S1 S2 with grade 3/6 holosystolic murmur at apex radiating to left axilla. Abdomen: Soft, non-tender, non-distended with normoactive bowel sounds. No hepatomegaly. No obvious abdominal masses. Msk:  Strength and tone appears normal for age. Extremities: No clubbing, cyanosis or edema.  Distal pedal pulses are 2+ and equal bilaterally. Neuro: Alert and oriented X 3. Moves all extremities spontaneously. Psych:  Responds to questions appropriately with a normal affect.  ASSESSMENT AND PLAN: 1. Recurrent NSTEMI. Ranexa associated with N/V. Nitropatch increased yesterday. BP soft so will need to monitor closely. Would continue IV heparin for 48 hours. Cath films from 2013 reviewed. Severe 3 vessel CAD with chronically occluded mid LAD, LCx, mid RCA with collaterals. High grade ostial and proximal LAD with marked tortuosity. Not a candidate for PCI. High risk for CABG. Will continue medical management. 2. Moderate MR 3. Ischemic cardiomyopathy EF 45-50%. 4. S/p right CEA 5. Anemia of chronic disease. 6. HTN 7. Hyperlipidemia 8. PAD with chronic ulcer on toe.  Positive for MRSA. 9. History of UGI bleed 4/14 with multiple gastric ulcers.  Principal Problem:   Chest pain Active Problems:   Atrial fibrillation   Myocardial infarction   HTN (hypertension)   Coronary artery disease    Diabetes mellitus   GERD (gastroesophageal reflux disease)   Unspecified hypothyroidism    Signed, Peter [place] MD,FACC 09/01/2012 8:20 AM

## 2012-09-01 NOTE — Progress Notes (Signed)
Patient notified RN of onset of chest pain to include pressure radiating toward shoulders.  Emergency protocol initiated.  VS obtained, SL nitroglycerin administered x1, and ECG obtained.

## 2012-09-01 NOTE — Progress Notes (Signed)
PHARMACY FOLLOW UP CONSULT NOTE   Pharmacy Consult :  Heparin Indication: chest pain/ACS  Dosing Weight: 59 kg  Labs:  Recent Labs  08/31/12 0700 08/31/12 1640 09/01/12 0035 09/01/12 0525 09/01/12 1058 09/01/12 2127  HGB 9.4*  --   --  9.0*  --   --   HCT 28.8*  --   --  26.9*  --   --   PLT 260  --   --  252  --   --   INR  --  1.21  --   --   --   --   HEPARINUNFRC  --   --  0.17*  --  0.53 0.53  CREATININE 1.50*  --   --  1.17*  --   --    Estimated Creatinine Clearance: 30.3 ml/min (by C-G formula based on Cr of 1.17).   Infusions:  . heparin 800 Units/hr (09/01/12 1610)   Assessment:  79yo female on heparin with dosing for recurrent CP and severe CAD hx.  Heparin level remains therapeutic, 0.53 units/ml on 800 units/hr.  Goal of Therapy:  Heparin level 0.3-0.7 units/ml   Plan:   Continue Heparin at current rate of 800 units/hr. Daily Heparin Levels, CBC.  Monitor for bleeding complications   Laurena Bering, Pharm.D.  09/01/2012,10:34 PM

## 2012-09-01 NOTE — Progress Notes (Signed)
Patient reports chest pain is resolved.  She states she consistently has a very minimal amount of chest pressure at home r/t not being able to have a stent placed.

## 2012-09-01 NOTE — Progress Notes (Signed)
ANTICOAGULATION CONSULT NOTE - Follow Up Consult  Pharmacy Consult for heparin Indication: chest pain/ACS  Labs:  Recent Labs  08/31/12 0700 08/31/12 1345 08/31/12 1640 09/01/12 0035 09/01/12 0525 09/01/12 1058  HGB 9.4*  --   --   --  9.0*  --   HCT 28.8*  --   --   --  26.9*  --   PLT 260  --   --   --  252  --   APTT  --   --  84*  --   --   --   LABPROT  --   --  15.0  --   --   --   INR  --   --  1.21  --   --   --   HEPARINUNFRC  --   --   --  0.17*  --  0.53  CREATININE 1.50*  --   --   --  1.17*  --   TROPONINI  --  2.66* 3.08* 2.53*  --   --      Assessment: 79yo female now therapeutic on heparin with dosing for recurrent CP and severe CAD hx. No bleeding issues noted, cbc stable. Noted plans to continue medical management with heparin x 48hours.  Goal of Therapy:  Heparin level 0.3-0.7 units/ml   Plan:  Continue heparin at 800 units/hr Recheck HL this evening to verify appropriate dosing  Sheppard Coil PharmD., BCPS Clinical Pharmacist Pager [phone] 09/01/2012 2:27 PM

## 2012-09-01 NOTE — Progress Notes (Signed)
ANTICOAGULATION CONSULT NOTE - Follow Up Consult  Pharmacy Consult for heparin Indication: chest pain/ACS  Labs:  Recent Labs  08/29/12 0720  08/31/12 0700 08/31/12 1345 08/31/12 1640 09/01/12 0035  HGB 9.5*  --  9.4*  --   --   --   HCT 29.3*  --  28.8*  --   --   --   PLT 238  --  260  --   --   --   APTT  --   --   --   --  84*  --   LABPROT  --   --   --   --  15.0  --   INR  --   --   --   --  1.21  --   HEPARINUNFRC 0.59  --   --   --   --  0.17*  CREATININE 0.94  --  1.50*  --   --   --   TROPONINI  --   < >  --  2.66* 3.08* 2.53*  < > = values in this interval not displayed.   Assessment: 79yo female subtherapeutic on heparin with initial dosing for recurrent CP and severe CAD hx.  Goal of Therapy:  Heparin level 0.3-0.7 units/ml   Plan:  Will rebolus with heparin 1000 units and increase gtt by 2 units/kg/hr to 800 units/hr (was therapeutic on prior admission at 750 units/hr) and check level in 8hr.  Vernard Gambles, PharmD, BCPS  09/01/2012,1:51 AM

## 2012-09-01 NOTE — Progress Notes (Signed)
Dr. Shirlee Latch paged for cardiology.  Symptoms described with patient history.  Orders received for 250 mL NS bolus for low BP, and pain medication.  Call again with additional changes if necessary.

## 2012-09-02 DIAGNOSIS — I2589 Other forms of chronic ischemic heart disease: Secondary | ICD-10-CM

## 2012-09-02 LAB — CBC
HCT: 26.6 % — ABNORMAL LOW (ref 36.0–46.0)
Hemoglobin: 8.8 g/dL — ABNORMAL LOW (ref 12.0–15.0)
MCH: 27.9 pg (ref 26.0–34.0)
MCV: 84.4 fL (ref 78.0–100.0)
RBC: 3.15 MIL/uL — ABNORMAL LOW (ref 3.87–5.11)

## 2012-09-02 LAB — GLUCOSE, CAPILLARY
Glucose-Capillary: 122 mg/dL — ABNORMAL HIGH (ref 70–99)
Glucose-Capillary: 185 mg/dL — ABNORMAL HIGH (ref 70–99)

## 2012-09-02 NOTE — Progress Notes (Signed)
ANTICOAGULATION CONSULT NOTE - Follow Up Consult  Pharmacy Consult for Heparin Indication: chest pain/ACS  Allergies  Allergen Reactions  . Aspirin Other (See Comments)    High doses of aspirin puts patient into COMA; currently taking low dose aspirin  . Benzoin Other (See Comments)    Puts patient into COMA  . Cephalexin Rash and Other (See Comments)    Puts patient into COMA  . Cephalosporins Other (See Comments)    Puts patient into COMA  . Codeine Swelling and Rash    CANNOT TAKE Injectable. Can take PO form  . Contrast Media (Iodinated Diagnostic Agents) Other (See Comments)    Causes SYNCOPE / HYPOTENSION  . Demerol Other (See Comments)    Puts patient into COMA  . Dilaudid (Hydromorphone Hcl) Other (See Comments)    Puts patient into COMA  . Insulins Other (See Comments)    "reacted violently to medications" unsure about what that means  . Morphine And Related Other (See Comments)    Puts patient into COMA  . Other Other (See Comments)    Puts patient into COMA.Patient highly sensitive to antibiotics, narcotics and any medication used to put patient to sleep for surgery.  . Penicillins Other (See Comments)    Puts patient into COMA  . Sulfa Antibiotics Other (See Comments)    Puts patient into COMA  . Valium (Diazepam) Other (See Comments)    VERY CRAZY FEELINGS AND ACTIONS, CLIMBING THE WALLS TYPE REACTION  . Vistaril (Hydroxyzine Hcl) Other (See Comments)    CAUSES PATIENT  TO FEEL LIKE "CLIMBING THE WALLS TYPE OF REACTION  . Ranexa (Ranolazine)     ? n/v  . Carafate (Sucralfate) Nausea And Vomiting  . Latex Itching and Rash  . Lisinopril Nausea And Vomiting and Cough    Patient Measurements: Height: 5\' 2"  (157.5 cm) Weight: 129 lb 13.6 oz (58.9 kg) IBW/kg (Calculated) : 50.1 Heparin Dosing Weight: 59kg  Vital Signs: Temp: 97.2 F (36.2 C) (08/08 0501) Temp src: Oral (08/08 0501) BP: 129/48 mmHg (08/08 0501) Pulse Rate: 65 (08/08  0501)  Labs:  Recent Labs  08/31/12 0700 08/31/12 1345 08/31/12 1640  09/01/12 0035 09/01/12 0525 09/01/12 1058 09/01/12 2127 09/02/12 0607  HGB 9.4*  --   --   --   --  9.0*  --   --  8.8*  HCT 28.8*  --   --   --   --  26.9*  --   --  26.6*  PLT 260  --   --   --   --  252  --   --  272  APTT  --   --  84*  --   --   --   --   --   --   LABPROT  --   --  15.0  --   --   --   --   --   --   INR  --   --  1.21  --   --   --   --   --   --   HEPARINUNFRC  --   --   --   < > 0.17*  --  0.53 0.53 0.48  CREATININE 1.50*  --   --   --   --  1.17*  --   --   --   TROPONINI  --  2.66* 3.08*  --  2.53*  --   --   --   --   < > =  values in this interval not displayed.  Estimated Creatinine Clearance: 30.3 ml/min (by C-G formula based on Cr of 1.17).   Medications:  Heparin @ 800 units/hr  Assessment: 82yof continues on heparin for recurrent CP w/ severe CAD hx. She is not a candidate for PCI and is too high risk for CABG, so plan is for medical management. Heparin level this morning is therapeutic. Hgb slowly trending down but no bleeding reported, platelets stable.  Goal of Therapy:  Heparin level 0.3-0.7 units/ml Monitor platelets by anticoagulation protocol: Yes   Plan:  1) Continue heparin at 800 units/hr 2) Follow up heparin LOT  Fredrik Rigger 09/02/2012,9:27 AM

## 2012-09-02 NOTE — Progress Notes (Signed)
CARDIAC REHAB PHASE I   PRE:  Rate/Rhythm: 73 SR    BP: sitting 167/87    SaO2:   MODE:  Ambulation: 450 ft   POST:  Rate/Rhythm: 86 SR    BP: sitting 142/72     SaO2:   Pt to BR and changed ostomy bag, freshened up at sink, then ambulated with assist x1 (usually uses cane). Tired by end of walk however no angina. Pt very talkative entire time. BP high before walk presumably because of activities in BR. Better after walk. Reviewed NTG use and ex as tolerated.  1000-1105   Elissa Lovett [place] CES, ACSM 09/02/2012 11:01 AM

## 2012-09-02 NOTE — Progress Notes (Addendum)
TELEMETRY: Reviewed telemetry pt in NSR  Filed Vitals:   09/01/12 1024 09/01/12 2000 09/01/12 2303 09/02/12 0501  BP: 108/50 110/50 116/50 129/48  Pulse:  74 78 65  Temp:  98.8 F (37.1 C)  97.2 F (36.2 C)  TempSrc:  Oral  Oral  Resp:  18  18  Height:      Weight:      SpO2:  100%  98%    Intake/Output Summary (Last 24 hours) at 09/02/12 0735 Last data filed at 09/02/12 0559  Gross per 24 hour  Intake    720 ml  Output   2650 ml  Net  -1930 ml    SUBJECTIVE: Patient feels very well. No further chest pain since night before last. No SOB.  LABS: Basic Metabolic Panel:  Recent Labs  82/95/62 0700 09/01/12 0525  NA 136 140  K 4.2 4.0  CL 101 105  CO2 24 25  GLUCOSE 140* 125*  BUN 32* 22  CREATININE 1.50* 1.17*  CALCIUM 9.4 8.8   Liver Function Tests:  Recent Labs  09/01/12 0525  AST 25  ALT 9  ALKPHOS 56  BILITOT 0.1*  PROT 5.7*  ALBUMIN 2.7*   CBC:  Recent Labs  08/31/12 0700 09/01/12 0525  WBC 11.0* 9.9  HGB 9.4* 9.0*  HCT 28.8* 26.9*  MCV 84.0 84.1  PLT 260 252   Cardiac Enzymes:  Recent Labs  08/31/12 1345 08/31/12 1640 09/01/12 0035  TROPONINI 2.66* 3.08* 2.53*    Radiology/Studies:  Dg Chest 2 View  08/31/2012   *RADIOLOGY REPORT*  Clinical Data: Chest pain, nausea and vomiting.  CHEST - 2 VIEW  Comparison: PA and lateral chest 08/28/2012 and single view of the chest 05/21/2011.  Findings: The chest is hyperexpanded.  Heart size is normal. Kerley B lines are identified.  No consolidative process is seen. No pneumothorax or pleural fluid.  IMPRESSION:  1.  Findings compatible with mild interstitial edema. 2.  Pulmonary hyperexpansion suggestive of emphysema.   Original Report Authenticated By: Holley Dexter, M.D.   Dg Chest 2 View  08/28/2012   *RADIOLOGY REPORT*  Clinical Data: Chest pain  CHEST - 2 VIEW  Comparison: 08/26/2012  Findings: Increased interstitial markings.  No frank interstitial edema.  No pleural effusion or  pneumothorax.  The heart is normal in size.  Mild superior endplate changes involving a lower thoracic vertebral body, likely T12, unchanged from 2013.  IMPRESSION: No evidence of acute cardiopulmonary disease.   Original Report Authenticated By: Charline Bills, M.D.   Nm Pulmonary Perf And Vent  08/28/2012   *RADIOLOGY REPORT*  Clinical Data:  Chest pain and shortness of breath.  NUCLEAR MEDICINE VENTILATION - PERFUSION LUNG SCAN  Technique:  Wash-in, equilibrium, and wash-out phase ventilation images were obtained using Xe-133 gas.  Perfusion images were obtained in multiple projections after intravenous injection of Tc- 70m MAA.  Radiopharmaceuticals:  40 mCi aerosolized technetium DTPA and 6.0 mCi Tc-38m MAA.  Comparison:  Chest x-ray 09/24/2012  Findings: The ventilation scan demonstrates slight decreased ventilation in the upper lobes bilaterally.  Central clumping of the radiopharmaceutical is noted.  No large segmental defects are demonstrated here  The perfusion scan demonstrates patchy E non segmental perfusion defects and both lungs.  Findings are most likely due to emphysema. I do not see any definite segmental or subsegmental defects to suggest pulmonary embolism but because of the diffuse abnormality this is considered a low probability study for pulmonary embolism.  IMPRESSION: Low probability  ventilation perfusion lung scan for pulmonary embolism.   Original Report Authenticated By: Rudie Meyer, M.D.    Ecg: NSR with LAD, Inferior infarct.   PHYSICAL EXAM General: Elderly, pale, in no acute distress. Very talkative. Head: Normal Neck: Negative for carotid bruits. JVD not elevated. Lungs: Clear bilaterally to auscultation without wheezes, rales, or rhonchi. Breathing is unlabored. Heart: RRR S1 S2 with grade 3/6 holosystolic murmur at apex radiating to left axilla. Abdomen: Soft, non-tender, non-distended with normoactive bowel sounds. No hepatomegaly. No obvious abdominal  masses. Msk:  Strength and tone appears normal for age. Extremities: No clubbing, cyanosis or edema.  Distal pedal pulses are 2+ and equal bilaterally. Neuro: Alert and oriented X 3. Moves all extremities spontaneously. Psych:  Responds to questions appropriately with a normal affect.  ASSESSMENT AND PLAN: 1. Recurrent NSTEMI. Ranexa associated with N/V. Nitropatch increased.  Would continue IV heparin for another 24  Hours then DC if no further chest pain. Cath films from 2013 reviewed. Severe 3 vessel CAD with chronically occluded mid LAD, LCx, mid RCA with collaterals. High grade ostial and proximal LAD with marked tortuosity. Not a candidate for PCI. High risk for CABG. Will continue medical management. Will ask cardiac Rehab to see and advance activity. 2. Moderate MR 3. Ischemic cardiomyopathy EF 45-50%. 4. S/p right CEA 5. Anemia of chronic disease. 6. HTN 7. Hyperlipidemia 8. PAD with chronic ulcer on toe.  Positive for MRSA. 9. History of UGI bleed 4/14 with multiple gastric ulcers.  Principal Problem:   NSTEMI (non-ST elevated myocardial infarction), recurrent Active Problems:   HTN (hypertension)   Coronary artery disease   Diabetes mellitus   GERD (gastroesophageal reflux disease)   Unspecified hypothyroidism   Cardiomyopathy, ischemic, EF 45%   Nausea and vomiting    Signed, Aleyah Balik [place] MD,FACC 09/02/2012 7:35 AM

## 2012-09-02 NOTE — Care Management Note (Unsigned)
    Page 1 of 1   09/02/2012     3:33:54 PM   CARE MANAGEMENT NOTE 09/02/2012  Patient:  MITCHELL, EPLING   Account Number:  [id number]  Date Initiated:  09/02/2012  Documentation initiated by:  Shavon Ashmore  Subjective/Objective Assessment:   PT ADM ON 09/02/12 WITH [place], INCREASED TROPONINS.  PTA, PT INDEPENDENT, LIVES WITH SON AND HIS FAMILY.     Action/Plan:   P.T. EVAL TODAY; RECOMMENDATION IS FOR NO PT FOLLOW UP AT HOME.  FAMILY WANTS PT PLACED IN ALF, BUT SHE IS REFUSING. CSW HAS MET WITH PT AND FAMILY TO DISCUSS.   Anticipated DC Date:  09/04/2012   Anticipated DC Plan:  HOME/SELF CARE  In-house referral  Clinical Social Worker      DC Planning Services  CM consult      Choice offered to / List presented to:             Status of service:  In process, will continue to follow Medicare Important Message given?   (If response is "NO", the following Medicare IM given date fields will be blank) Date Medicare IM given:   Date Additional Medicare IM given:    Discharge Disposition:    Per UR Regulation:  Reviewed for med. necessity/level of care/duration of stay  If discussed at Long Length of Stay Meetings, dates discussed:    Comments:

## 2012-09-02 NOTE — Evaluation (Signed)
Physical Therapy Evaluation Patient Details Name: Julia Greene MRN: [id number] DOB: [birth year] Today's Date: 09/02/2012 Time: 7829-5621 PT Time Calculation (min): 26 min  PT Assessment / Plan / Recommendation History of Present Illness  NSTEMI to be medically managed  Clinical Impression  Patient demonstrates some very modest deficits in balance with mobility but is able to self correct with any physical needs for assist. Patient ambulated increased distance without assistive device; scored 23/24 on DGI and demonstrates ability to pick up objects from the ground without difficulty. At this time, feel patient is at baseline and demonstrates no acute PT needs. Spoke with patient at length regarding mobility and home situation. Patient demonstrates some sadness over not having been able to spend more time in her own home, but feels that she is at baseline for mobility. Patient educated on some activities to help improve overall mobility upon discharge. Patient appreciative for consult. No further needs, PT will sign off, patient in agreement.    PT Assessment  Patent does not need any further PT services    Follow Up Recommendations  No PT follow up          Equipment Recommendations  None recommended by PT          Precautions / Restrictions Precautions Precautions: None   Pertinent Vitals/Pain VSS no pain reported      Mobility  Bed Mobility Bed Mobility: Supine to Sit;Sitting - Scoot to Edge of Bed;Sit to Supine Supine to Sit: 6: Modified independent (Device/Increase time) Sitting - Scoot to Edge of Bed: 6: Modified independent (Device/Increase time) Sit to Supine: 6: Modified independent (Device/Increase time) Details for Bed Mobility Assistance: increased time to perform but requires not assist or cues Transfers Transfers: Sit to Stand;Stand to Sit Sit to Stand: 7: Independent Stand to Sit: 7: Independent Details for Transfer Assistance: no assist or cues  needed Ambulation/Gait Ambulation/Gait Assistance: 5: Supervision Ambulation Distance (Feet): 420 Feet Assistive device: None Ambulation/Gait Assistance Details: WFL no asisst needed Gait Pattern: Within Functional Limits Gait velocity: decreased at time General Gait Details: steady, no physical assist, 2 balance checks able to self correct Stairs: Yes Stairs Assistance: 5: Supervision Number of Stairs: 1       PT Goals(Current goals can be found in the care plan section)    Visit Information  Last PT Received On: 09/02/12 Assistance Needed: +1 History of Present Illness: NSTEMI to be medically managed       Prior Functioning  Home Living Family/patient expects to be discharged to:: Private residence Living Arrangements: Children Available Help at Discharge: Family Type of Home: House Home Access: Stairs to enter Secretary/administrator of Steps: 2 Home Layout: Able to live on main level with bedroom/bathroom Home Equipment: Cane - single point Prior Function Level of Independence: Independent Communication Communication: No difficulties Dominant Hand: Right    Cognition  Cognition Arousal/Alertness: Awake/alert Behavior During Therapy: WFL for tasks assessed/performed Overall Cognitive Status: Within Functional Limits for tasks assessed (Patient is oriented, very social with conversation)    Extremity/Trunk Assessment Upper Extremity Assessment Upper Extremity Assessment: Overall WFL for tasks assessed Lower Extremity Assessment Lower Extremity Assessment: Overall WFL for tasks assessed   Balance Balance Balance Assessed: Yes Standardized Balance Assessment Standardized Balance Assessment: Dynamic Gait Index Dynamic Gait Index Level Surface: Normal Change in Gait Speed: Normal Gait with Horizontal Head Turns: Normal Gait with Vertical Head Turns: Normal Gait and Pivot Turn: Normal Step Over Obstacle: Normal Step Around Obstacles: Mild Impairment Steps:  Normal  Total Score: 23 High Level Balance High Level Balance Activites: Side stepping;Backward walking;Direction changes;Turns;Sudden stops;Head turns High Level Balance Comments: no difficulty  End of Session PT - End of Session Equipment Utilized During Treatment: Gait belt Activity Tolerance: Patient tolerated treatment well Patient left: in bed;with call bell/phone within reach Nurse Communication: Mobility status  GP     Fabio Asa 09/02/2012, 1:47 PM Charlotte Crumb, PT DPT  [phone]

## 2012-09-02 NOTE — Progress Notes (Signed)
CSW spoke with patient about possible ALF placement. Patient is completely competent. Patient shared her frustrations with her family. Patient stated that she is still able to live independent and wishes to do so. CSW consulted with doctor who is going to have a family meeting with the son and patient. Please re consult if CSW needs to help.  Maree Krabbe, MSW, Theresia Majors [phone]
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
## 2012-09-03 DIAGNOSIS — F39 Unspecified mood [affective] disorder: Secondary | ICD-10-CM

## 2012-09-03 DIAGNOSIS — I10 Essential (primary) hypertension: Secondary | ICD-10-CM

## 2012-09-03 LAB — GLUCOSE, CAPILLARY
Glucose-Capillary: 145 mg/dL — ABNORMAL HIGH (ref 70–99)
Glucose-Capillary: 151 mg/dL — ABNORMAL HIGH (ref 70–99)
Glucose-Capillary: 144 mg/dL — ABNORMAL HIGH (ref 70–99)
Glucose-Capillary: 178 mg/dL — ABNORMAL HIGH (ref 70–99)

## 2012-09-03 LAB — CBC
HCT: 30.7 % — ABNORMAL LOW (ref 36.0–46.0)
Hemoglobin: 10.1 g/dL — ABNORMAL LOW (ref 12.0–15.0)
MCH: 27.9 pg (ref 26.0–34.0)
MCV: 84.8 fL (ref 78.0–100.0)
Platelets: 324 10*3/uL (ref 150–400)
RBC: 3.62 MIL/uL — ABNORMAL LOW (ref 3.87–5.11)
WBC: 10.8 10*3/uL — ABNORMAL HIGH (ref 4.0–10.5)

## 2012-09-03 MED ORDER — LIDOCAINE HCL (PF) 1 % IJ SOLN
INTRAMUSCULAR | Status: AC
  Filled 2012-09-03: qty 30
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
## 2012-09-03 NOTE — Progress Notes (Signed)
Waiting for psych to evaluate capacity. stable

## 2012-09-03 NOTE — Progress Notes (Signed)
SUBJECTIVE: The patient is doing well today.  At this time, she denies chest pain, shortness of breath, or any new concerns.  Her nausea is resolved.  She has had no further chest pain.   CURRENT MEDICATIONS: . aspirin EC  81 mg Oral Daily  . Chlorhexidine Gluconate Cloth  6 each Topical Q0600  . ezetimibe  10 mg Oral Daily  . furosemide  20 mg Oral Daily  . levothyroxine  25 mcg Oral QAC breakfast  . metoprolol  25 mg Oral BID  . mupirocin ointment  1 application Nasal BID  . nitroGLYCERIN  0.6 mg Transdermal Q24H  . pantoprazole  40 mg Oral Daily  . saccharomyces boulardii  250 mg Oral BID  . sodium chloride  3 mL Intravenous Q12H   . sodium chloride 20 mL (09/01/12 1402)  . heparin 800 Units/hr (09/03/12 0121)    OBJECTIVE: Physical Exam: Filed Vitals:   09/02/12 0501 09/02/12 1358 09/02/12 2030 09/03/12 0533  BP: 129/48 132/53 105/42 120/64  Pulse: 65 70 73 62  Temp: 97.2 F (36.2 C) 98 F (36.7 C) 99.2 F (37.3 C) 98 F (36.7 C)  TempSrc: Oral Oral Oral Oral  Resp: 18 18 18 19   Height:      Weight:      SpO2: 98% 99% 99% 97%    Intake/Output Summary (Last 24 hours) at 09/03/12 1610 Last data filed at 09/03/12 0500  Gross per 24 hour  Intake    480 ml  Output   2602 ml  Net  -2122 ml    Telemetry reveals sinus rhythm  GEN- The patient is elderly appearing, alert and confused Head- normocephalic, atraumatic Eyes-  Sclera clear, conjunctiva pink Ears- hearing intact Oropharynx- clear Neck- supple,   Lungs- Clear to ausculation bilaterally, normal work of breathing Heart- Regular rate and rhythm  GI- soft, NT, ND, + BS Extremities- no clubbing, cyanosis, or edema Psych- she is quite confused,  Verbose, hard to keep on topic but very pleasant  Neuro- strength and sensation are intact  LABS: Basic Metabolic Panel:  Recent Labs  96/04/54 0525  NA 140  K 4.0  CL 105  CO2 25  GLUCOSE 125*  BUN 22  CREATININE 1.17*  CALCIUM 8.8   Liver  Function Tests:  Recent Labs  09/01/12 0525  AST 25  ALT 9  ALKPHOS 56  BILITOT 0.1*  PROT 5.7*  ALBUMIN 2.7*   CBC:  Recent Labs  09/02/12 0607 09/03/12 0525  WBC 9.3 10.8*  HGB 8.8* 10.1*  HCT 26.6* 30.7*  MCV 84.4 84.8  PLT 272 324   Cardiac Enzymes:  Recent Labs  08/31/12 1345 08/31/12 1640 09/01/12 0035  TROPONINI 2.66* 3.08* 2.53*    RADIOLOGY: Dg Chest 2 View 08/31/2012   *RADIOLOGY REPORT*  Clinical Data: Chest pain, nausea and vomiting.  CHEST - 2 VIEW  Comparison: PA and lateral chest 08/28/2012 and single view of the chest 05/21/2011.  Findings: The chest is hyperexpanded.  Heart size is normal. Kerley B lines are identified.  No consolidative process is seen. No pneumothorax or pleural fluid.  IMPRESSION:  1.  Findings compatible with mild interstitial edema. 2.  Pulmonary hyperexpansion suggestive of emphysema.   Original Report Authenticated By: Holley Dexter, M.D.   Nm Pulmonary Perf And Vent 08/28/2012   *RADIOLOGY REPORT*  Clinical Data:  Chest pain and shortness of breath.  NUCLEAR MEDICINE VENTILATION - PERFUSION LUNG SCAN  Technique:  Wash-in, equilibrium, and wash-out phase ventilation  images were obtained using Xe-133 gas.  Perfusion images were obtained in multiple projections after intravenous injection of Tc- 2m MAA.  Radiopharmaceuticals:  40 mCi aerosolized technetium DTPA and 6.0 mCi Tc-51m MAA.  Comparison:  Chest x-ray 09/24/2012  Findings: The ventilation scan demonstrates slight decreased ventilation in the upper lobes bilaterally.  Central clumping of the radiopharmaceutical is noted.  No large segmental defects are demonstrated here  The perfusion scan demonstrates patchy E non segmental perfusion defects and both lungs.  Findings are most likely due to emphysema. I do not see any definite segmental or subsegmental defects to suggest pulmonary embolism but because of the diffuse abnormality this is considered a low probability study for pulmonary  embolism.  IMPRESSION: Low probability ventilation perfusion lung scan for pulmonary embolism.   Original Report Authenticated By: Rudie Meyer, M.D.    ASSESSMENT AND PLAN:  Principal Problem:   NSTEMI (non-ST elevated myocardial infarction), recurrent Active Problems:   HTN (hypertension)   Coronary artery disease   Diabetes mellitus   GERD (gastroesophageal reflux disease)   Unspecified hypothyroidism   Cardiomyopathy, ischemic, EF 45%   Nausea and vomiting  1. NSTEMI- clinically improved Stop heparin Medical management going forward No further CV interventions planned Cardiac rehab may be helpful  2. Ischemic CM/ MR Stable Continue current medicines.  She an allergy to ace inhibitor She is on a beta blocker  3. HTN Stable No change required today  No further inpatient CV recs Will see as needed Please call with questions

## 2012-09-04 DIAGNOSIS — R112 Nausea with vomiting, unspecified: Secondary | ICD-10-CM

## 2012-09-04 LAB — GLUCOSE, CAPILLARY: Glucose-Capillary: 133 mg/dL — ABNORMAL HIGH (ref 70–99)

## 2012-09-04 MED ORDER — NITROGLYCERIN 0.6 MG/HR TD PT24: 1.0000 | MEDICATED_PATCH | TRANSDERMAL | Status: DC

## 2012-09-04 MED ORDER — NITROGLYCERIN 0.4 MG SL SUBL: 0.4000 mg | SUBLINGUAL_TABLET | SUBLINGUAL | Status: DC | PRN

## 2012-09-04 NOTE — Discharge Summary (Signed)
Physician Discharge Summary  Julia Greene JYN:[id number] DOB: 10/31/1930 DOA: 08/31/2012  PCP: Oneal Grout, MD  Admit date: 08/31/2012 Discharge date: 09/04/2012  Time spent: greater than 30 minutes  Discharge Diagnoses:  Principal Problem:   NSTEMI (non-ST elevated myocardial infarction), recurrent Active Problems:   Coronary artery disease   HTN (hypertension)   Diabetes mellitus   Cardiomyopathy, ischemic, EF 45%   GERD (gastroesophageal reflux disease)   Unspecified hypothyroidism   Nausea and vomiting  Discharge Condition: stable  Filed Weights   08/31/12 0615 08/31/12 0934  Weight: 60.782 kg (134 lb) 58.9 kg (129 lb 13.6 oz)    History and Hospital Course: With a pmh of afib, CAD, gerd, PAD who was recently discharged for NSTEMI, followed by Cardiology. The patient was noted to have CAD not amenable by PCI and was therefore started on Renexa and cont on medical mgt. The patient reported sudden onset nausea/vomiting associated with chest pain, diaphoresis, and numbness/tingling of both hands. The chest pain was described as similar to what she experienced when she had her NSTEMI one week ago. The patient reported taking nitrates with only some improvement of her symptoms. Presently the patient is asymptomatic.   Admitted to hospitalist service and cardiology consulted.  Ruled in for MI.  Vomiting felt to be secondary to Ranexa.  Cardiology recommended increasing nitrodur, stopping Ranexa.  Patient had no further vomiting or chest pain.  Family was concerned about patient driving or living alone.  They reported she had delusions about knowing the president, and important people in [place].  Therefore, psych was consulted, but deemed patient capable of giving informed medical consent.  Consultations:  [facility]  Psychiatry  Discharge Exam: Filed Vitals:   09/04/12 0518  BP: 152/68  Pulse: 74  Temp: 97.9 F (36.6 C)  Resp: 16   General: alert,  oriented. Cardiovascular: RRR Respiratory: CTA without WRR  Discharge Instructions  Discharge Orders   Future Appointments Provider Department Dept Phone   09/06/2012 11:00 AM Minda Meo, PA-C [facility] Main Office [place]) [phone]   10/05/2012 11:00 AM Oneal Grout, MD [facility] CARE [phone]   05/25/2013 9:30 AM [facility] [phone]   05/25/2013 10:30 AM Sherren Kerns, MD [facility] [phone]   Future Orders Complete By Expires     Diet - low sodium heart healthy  As directed     Driving Restrictions  As directed     Comments:      Do not drive until cleared by your doctor.    Increase activity slowly  As directed         Medication List    STOP taking these medications       naproxen sodium 220 MG tablet  Commonly known as:  ANAPROX     nitroGLYCERIN 0.4 mg/hr patch  Commonly known as:  NITRODUR - Dosed in mg/24 hr  Replaced by:  nitroGLYCERIN 0.6 mg/hr patch     ranolazine 500 MG 12 hr tablet  Commonly known as:  RANEXA      TAKE these medications       acetaminophen-codeine 300-30 MG per tablet  Commonly known as:  TYLENOL #3  Take 1-2 tablets by mouth every 4 (four) hours as needed for pain.     ADULT GUMMY PO  Take 2 tablets by mouth daily.     aspirin EC 81 MG tablet  Take 81 mg by mouth daily.  BENADRYL PO  Take 30 mLs by mouth as needed. For cough     ezetimibe 10 MG tablet  Commonly known as:  ZETIA  Take 1 tablet (10 mg total) by mouth daily.     furosemide 20 MG tablet  Commonly known as:  LASIX  Take 1 tablet (20 mg total) by mouth daily.     levothyroxine 25 MCG tablet  Commonly known as:  SYNTHROID, LEVOTHROID  Take 25 mcg by mouth daily.     Melatonin 5 MG Caps  Take 10 mg by mouth at bedtime as needed. To help sleep     metoprolol 50 MG tablet  Commonly known as:  LOPRESSOR  Take 0.5 tablets (25 mg total) by mouth 2  (two) times daily.     nitroGLYCERIN 0.6 mg/hr patch  Commonly known as:  NITRODUR - Dosed in mg/24 hr  Place 1 patch (0.6 mg total) onto the skin daily.     nitroGLYCERIN 0.4 MG SL tablet  Commonly known as:  NITROSTAT  Place 0.4 mg under the tongue every 5 (five) minutes as needed for chest pain.     pantoprazole 40 MG tablet  Commonly known as:  PROTONIX  Take 40 mg by mouth daily.     saccharomyces boulardii 250 MG capsule  Commonly known as:  FLORASTOR  Take 1 capsule (250 mg total) by mouth 2 (two) times daily.       Allergies  Allergen Reactions  . Aspirin Other (See Comments)    High doses of aspirin puts patient into COMA; currently taking low dose aspirin  . Benzoin Other (See Comments)    Puts patient into COMA  . Cephalexin Rash and Other (See Comments)    Puts patient into COMA  . Cephalosporins Other (See Comments)    Puts patient into COMA  . Codeine Swelling and Rash    CANNOT TAKE Injectable. Can take PO form  . Contrast Media (Iodinated Diagnostic Agents) Other (See Comments)    Causes SYNCOPE / HYPOTENSION  . Demerol Other (See Comments)    Puts patient into COMA  . Dilaudid (Hydromorphone Hcl) Other (See Comments)    Puts patient into COMA  . Insulins Other (See Comments)    "reacted violently to medications" unsure about what that means  . Morphine And Related Other (See Comments)    Puts patient into COMA  . Other Other (See Comments)    Puts patient into COMA.Patient highly sensitive to antibiotics, narcotics and any medication used to put patient to sleep for surgery.  . Penicillins Other (See Comments)    Puts patient into COMA  . Sulfa Antibiotics Other (See Comments)    Puts patient into COMA  . Valium (Diazepam) Other (See Comments)    VERY CRAZY FEELINGS AND ACTIONS, CLIMBING THE WALLS TYPE REACTION  . Vistaril (Hydroxyzine Hcl) Other (See Comments)    CAUSES PATIENT  TO FEEL LIKE "CLIMBING THE WALLS TYPE OF REACTION  . Ranexa  (Ranolazine)     ? n/v  . Carafate (Sucralfate) Nausea And Vomiting  . Latex Itching and Rash  . Lisinopril Nausea And Vomiting and Cough      The results of significant diagnostics from this hospitalization (including imaging, microbiology, ancillary and laboratory) are listed below for reference.    Significant Diagnostic Studies: Dg Chest 2 View  08/31/2012   *RADIOLOGY REPORT*  Clinical Data: Chest pain, nausea and vomiting.  CHEST - 2 VIEW  Comparison: PA and lateral chest 08/28/2012 and  single view of the chest 05/21/2011.  Findings: The chest is hyperexpanded.  Heart size is normal. Kerley B lines are identified.  No consolidative process is seen. No pneumothorax or pleural fluid.  IMPRESSION:  1.  Findings compatible with mild interstitial edema. 2.  Pulmonary hyperexpansion suggestive of emphysema.   Original Report Authenticated By: Holley Dexter, M.D.   Dg Chest 2 View  08/28/2012   *RADIOLOGY REPORT*  Clinical Data: Chest pain  CHEST - 2 VIEW  Comparison: 08/26/2012  Findings: Increased interstitial markings.  No frank interstitial edema.  No pleural effusion or pneumothorax.  The heart is normal in size.  Mild superior endplate changes involving a lower thoracic vertebral body, likely T12, unchanged from 2013.  IMPRESSION: No evidence of acute cardiopulmonary disease.   Original Report Authenticated By: Charline Bills, M.D.   Nm Pulmonary Perf And Vent  08/28/2012   *RADIOLOGY REPORT*  Clinical Data:  Chest pain and shortness of breath.  NUCLEAR MEDICINE VENTILATION - PERFUSION LUNG SCAN  Technique:  Wash-in, equilibrium, and wash-out phase ventilation images were obtained using Xe-133 gas.  Perfusion images were obtained in multiple projections after intravenous injection of Tc- 2m MAA.  Radiopharmaceuticals:  40 mCi aerosolized technetium DTPA and 6.0 mCi Tc-81m MAA.  Comparison:  Chest x-ray 09/24/2012  Findings: The ventilation scan demonstrates slight decreased ventilation in  the upper lobes bilaterally.  Central clumping of the radiopharmaceutical is noted.  No large segmental defects are demonstrated here  The perfusion scan demonstrates patchy E non segmental perfusion defects and both lungs.  Findings are most likely due to emphysema. I do not see any definite segmental or subsegmental defects to suggest pulmonary embolism but because of the diffuse abnormality this is considered a low probability study for pulmonary embolism.  IMPRESSION: Low probability ventilation perfusion lung scan for pulmonary embolism.   Original Report Authenticated By: Rudie Meyer, M.D.   Dg Chest Port 1 View  08/26/2012   *RADIOLOGY REPORT*  Clinical Data: Chest pain which radiated into both upper extremities earlier today.  Prior history of MI.  PORTABLE CHEST - 1 VIEW  Comparison: Portable chest x-ray 05/20/2012, 02/27/2012 and two- view chest x-ray 09/30/2011.  Findings: Cardiac silhouette upper normal in size for AP portable technique.  Thoracic aorta atherosclerotic, unchanged.  Hilar and mediastinal contours otherwise unremarkable.  Lungs clear. Bronchovascular markings normal.  Pulmonary vascularity normal.  No pneumothorax.  No pleural effusions.  IMPRESSION: Stable borderline heart size.  No acute cardiopulmonary disease.   Original Report Authenticated By: Hulan Saas, M.D.    Microbiology: Recent Results (from the past 240 hour(s))  MRSA PCR SCREENING     Status: Abnormal   Collection Time    08/26/12  9:52 PM      Result Value Range Status   MRSA by PCR POSITIVE (*) NEGATIVE Final   Comment:            The GeneXpert MRSA Assay (FDA     approved for NASAL specimens     only), is one component of a     comprehensive MRSA colonization     surveillance program. It is not     intended to diagnose MRSA     infection nor to guide or     monitor treatment for     MRSA infections.     RESULT CALLED TO, READ BACK BY AND VERIFIED WITH:     C.RALPH RN AT 0103 08/27/12 L.PITT      Labs: Basic Metabolic Panel:  Recent Labs Lab 08/29/12 0720 08/31/12 0700 09/01/12 0525  NA 137 136 140  K 4.4 4.2 4.0  CL 103 101 105  CO2 26 24 25   GLUCOSE 133* 140* 125*  BUN 16 32* 22  CREATININE 0.94 1.50* 1.17*  CALCIUM 9.3 9.4 8.8   Liver Function Tests:  Recent Labs Lab 09/01/12 0525  AST 25  ALT 9  ALKPHOS 56  BILITOT 0.1*  PROT 5.7*  ALBUMIN 2.7*   No results found for this basename: LIPASE, AMYLASE,  in the last 168 hours No results found for this basename: AMMONIA,  in the last 168 hours CBC:  Recent Labs Lab 08/29/12 0720 08/31/12 0700 09/01/12 0525 09/02/12 0607 09/03/12 0525  WBC 8.4 11.0* 9.9 9.3 10.8*  HGB 9.5* 9.4* 9.0* 8.8* 10.1*  HCT 29.3* 28.8* 26.9* 26.6* 30.7*  MCV 84.2 84.0 84.1 84.4 84.8  PLT 238 260 252 272 324   Cardiac Enzymes:  Recent Labs Lab 08/31/12 0655 08/31/12 1345 08/31/12 1640 09/01/12 0035  TROPONINI <0.30 2.66* 3.08* 2.53*   BNP: BNP (last 3 results)  Recent Labs  05/20/12 0503 08/26/12 1559 08/31/12 0655  PROBNP 1481.0* 1401.0* 1924.0*   CBG:  Recent Labs Lab 09/03/12 0603 09/03/12 1110 09/03/12 1558 09/03/12 2118 09/04/12 0621  GLUCAP 145* 151* 144* 178* 133*   EKG: Normal sinus rhythm Inferior infarct , age undetermined  Signed:  Deshayla Empson L  [facility] 09/04/2012, 10:01 AM

## 2012-09-05 LAB — GLUCOSE, CAPILLARY: Glucose-Capillary: 147 mg/dL — ABNORMAL HIGH (ref 70–99)

## 2012-09-06 ENCOUNTER — Encounter: Payer: Medicare Other | Admitting: Cardiology

## 2012-09-06 ENCOUNTER — Encounter: Payer: Self-pay | Admitting: Cardiology

## 2012-09-06 ENCOUNTER — Ambulatory Visit (INDEPENDENT_AMBULATORY_CARE_PROVIDER_SITE_OTHER): Payer: Medicare Other | Admitting: Cardiology

## 2012-09-06 VITALS — BP 114/56 | HR 73 | Ht 62.0 in | Wt 128.8 lb

## 2012-09-06 DIAGNOSIS — I214 Non-ST elevation (NSTEMI) myocardial infarction: Secondary | ICD-10-CM

## 2012-09-06 DIAGNOSIS — I2510 Atherosclerotic heart disease of native coronary artery without angina pectoris: Secondary | ICD-10-CM

## 2012-09-06 NOTE — Patient Instructions (Addendum)
Your physician recommends that you schedule a follow-up appointment in: DR. Antoine Poche IN 4-6 WEEKS  Your physician recommends that you continue on your current medications as directed. Please refer to the Current Medication list given to you today.

## 2012-09-06 NOTE — Progress Notes (Signed)
ELECTROPHYSIOLOGY OFFICE NOTE  Patient ID: Julia Greene MRN: [id number], DOB/AGE: 1930/08/05   Date of Visit: 09/06/2012  Primary Physician: Oneal Grout, MD Primary Cardiologist: Antoine Poche, MD Reason for Visit: Hospital follow-up  History of Present Illness  Julia Greene is a 79 y.o. female with known CAD and PAD who was admitted in July with NSTEMI. Medical management was recommended. She then was readmitted with chest pain earlier this month. Her medical therapy was optimized, specifically her long-acting nitrates were up-titrated. Her CP resolved and she was discharged.  She presents today for hospital followup. She is accompanied by her daughter in-law. Since discharge, she reports she is doing well and has no complaints. She denies chest pain. She is pleased with her improvement since her nitroglycerin was increased. She denies shortness of breath. She denies palpitations, dizziness, near syncope or syncope. She denies LE swelling, orthopnea, PND or recent weight gain. She is compliant and tolerating medications without difficulty.  Past Medical History Past Medical History  Diagnosis Date  . Coronary artery disease     a. h/o NSTEMI 2012 in setting of AFib/perf'd divertic (in [place]. - coded, CPR, intubation during hospital stay);  b.  MV 2/13: EF 57%, mod isch septum and poss small part of the Ant wall;  c.  LHC 04/06/11: diffuse 3v CAD no high grade lesions amenable to PCI, surgery would be incomplete thus med rx recommended. d. NSTEMI 02/2012 managed medically;  e. 08/2012 NSTEMI->Med Rx.  . Atrial fibrillation     in setting of sepsis in [place]; tx with amiodarone (amio stopped 04/20/11);    Marland Kitchen Injury to liver   . Allergic rhinitis due to pollen   . Hyperlipidemia   . Hypertension   . Vitamin D deficiency   . Anemia, unspecified   . Diabetes mellitus     borderline  . Diverticulitis of colon with perforation s/p colectomy/ostomy 13Oct2012 02/23/2011    s/p colostomy  . Carotid  stenosis     a.  02/2011 - RICA 80-99% and LICA 60-79%;  b. s/p R CEA Dr. Darrick Penna 09/2011  . Ischemic cardiomyopathy     a.  Echo 10/2010 (in setting of NSTEMI at [facility] in [facility]):  mid ot apical septal AK, EF 40-45%, MAC, mild to mod MR, mild TR, no pulmo HTN;  b. Echo 02/2012: EF 45-50% with WMA, mild-mod MR.  Marland Kitchen PAD (peripheral artery disease)     a. ABIs 9/13: R 0.55; L 0.93;  b. arterial insufficiency ulcers on right foot;  followed by [facility];  c. angiogram 01/2012 with severe bilat SFA and pop disease => not good bypass candidate and PTA not an option => prob continue conservative mgmt  . Hypothyroid   . Fall at home Feb. 6, 2014    Left leg   . Complication of anesthesia     Allergie to narcotics and abx.  . History of pneumonia     "a few times; always associated w/surgeries" (05/20/2012)  . GERD (gastroesophageal reflux disease)   . Peptic ulcer disease     04/2012  . Arthritis     "legs, arm, bones; Chelation therapy cleared all that" (05/20/2012)  . History of blood transfusion     "many" (05/20/2012)  . Toe ulcer, right     5th toe; due to PVD/notes 05/20/2012  . Chronic kidney disease     a. Stage III - IV.  Marland Kitchen Chest pain 08/2012  . Myocardial infarction     Past Surgical  History Past Surgical History  Procedure Laterality Date  . Total hip arthroplasty Right 07/2004  . Colostomy  10/2010    for complex diverticular disease.  not done in [place]  . Cesarean section  1955, 1959, 1960  . Abdominal adhesion surgery  1976    at time of hysterectomy  . Endarterectomy  10/20/2011    Procedure: ENDARTERECTOMY CAROTID;  Surgeon: Sherren Kerns, MD;  Location: [facility] OR;  Service: Vascular;  Laterality: Right;  Right Carotid Endarterectomy with patch angioplasty  . Joint replacement  2006    Right  Hip  . Appendectomy  1943  . Abdominal hysterectomy  1976  . Combined hysterectomy abdominal w/ a&p repair / oophorectomy  1976    "left a piece of one ovary" 94/25/2014)  . Cardiac  catheterization    . Colostomy  10/2010  . Cataract extraction w/ intraocular lens  implant, bilateral Bilateral ~ 2013  . Dilation and curettage of uterus  1950's    "3-4 during childbirth time" (05/20/2012)  . Colectomy  10/2009    sigmoid/notes 05/20/2012  . Esophagogastroduodenoscopy N/A 05/20/2012    Procedure: ESOPHAGOGASTRODUODENOSCOPY (EGD);  Surgeon: Louis Meckel, MD;  Location: [facility] ENDOSCOPY;  Service: Endoscopy;  Laterality: N/A;    Allergies/Intolerances Allergies  Allergen Reactions  . Aspirin Other (See Comments)    High doses of aspirin puts patient into COMA; currently taking low dose aspirin  . Benzoin Other (See Comments)    Puts patient into COMA  . Cephalexin Rash and Other (See Comments)    Puts patient into COMA  . Cephalosporins Other (See Comments)    Puts patient into COMA  . Codeine Swelling and Rash    CANNOT TAKE Injectable. Can take PO form  . Contrast Media (Iodinated Diagnostic Agents) Other (See Comments)    Causes SYNCOPE / HYPOTENSION  . Demerol Other (See Comments)    Puts patient into COMA  . Dilaudid (Hydromorphone Hcl) Other (See Comments)    Puts patient into COMA  . Insulins Other (See Comments)    "reacted violently to medications" unsure about what that means  . Morphine And Related Other (See Comments)    Puts patient into COMA  . Other Other (See Comments)    Puts patient into COMA.Patient highly sensitive to antibiotics, narcotics and any medication used to put patient to sleep for surgery.  . Penicillins Other (See Comments)    Puts patient into COMA  . Sulfa Antibiotics Other (See Comments)    Puts patient into COMA  . Valium (Diazepam) Other (See Comments)    VERY CRAZY FEELINGS AND ACTIONS, CLIMBING THE WALLS TYPE REACTION  . Vistaril (Hydroxyzine Hcl) Other (See Comments)    CAUSES PATIENT  TO FEEL LIKE "CLIMBING THE WALLS TYPE OF REACTION  . Ranexa (Ranolazine)     ? n/v  . Carafate (Sucralfate) Nausea And Vomiting  .  Latex Itching and Rash  . Lisinopril Nausea And Vomiting and Cough   Current Home Medications Current Outpatient Prescriptions  Medication Sig Dispense Refill  . acetaminophen-codeine (TYLENOL #3) 300-30 MG per tablet Take 1-2 tablets by mouth every 4 (four) hours as needed for pain.      Marland Kitchen aspirin EC 81 MG tablet Take 81 mg by mouth daily.       . DiphenhydrAMINE HCl (BENADRYL PO) Take 30 mLs by mouth as needed. For cough      . ezetimibe (ZETIA) 10 MG tablet Take 1 tablet (10 mg total) by mouth daily.  30 tablet  11  . furosemide (LASIX) 20 MG tablet Take 1 tablet (20 mg total) by mouth daily.  30 tablet  3  . levothyroxine (SYNTHROID, LEVOTHROID) 25 MCG tablet Take 25 mcg by mouth daily.      . Melatonin 5 MG CAPS Take 10 mg by mouth at bedtime as needed. To help sleep      . metoprolol (LOPRESSOR) 50 MG tablet Take 0.5 tablets (25 mg total) by mouth 2 (two) times daily.  60 tablet  6  . Multiple Vitamins-Minerals (ADULT GUMMY PO) Take 2 tablets by mouth daily.      . nitroGLYCERIN (NITRODUR - DOSED IN MG/24 HR) 0.6 mg/hr patch Place 1 patch (0.6 mg total) onto the skin daily.  30 patch  2  . nitroGLYCERIN (NITROSTAT) 0.4 MG SL tablet Place 1 tablet (0.4 mg total) under the tongue every 5 (five) minutes as needed for chest pain.  20 tablet  0  . pantoprazole (PROTONIX) 40 MG tablet Take 40 mg by mouth daily.      Marland Kitchen saccharomyces boulardii (FLORASTOR) 250 MG capsule Take 1 capsule (250 mg total) by mouth 2 (two) times daily.  60 capsule  0  . [DISCONTINUED] amiodarone (PACERONE) 200 MG tablet Take 200 mg by mouth daily.       No current facility-administered medications for this visit.   Social History Social History  . Marital Status: Widowed   Social History Main Topics  . Smoking status: Never Smoker   . Smokeless tobacco: Never Used  . Alcohol Use: No  . Drug Use: No   Review of Systems General: No chills, fever, night sweats or weight changes Cardiovascular: No chest pain,  dyspnea on exertion, edema, orthopnea, palpitations, paroxysmal nocturnal dyspnea Dermatological: No rash, lesions or masses Respiratory: No cough, dyspnea Urologic: No hematuria, dysuria Abdominal: No nausea, vomiting, diarrhea, bright red blood per rectum, melena, or hematemesis Neurologic: No visual changes, weakness, changes in mental status All other systems reviewed and are otherwise negative except as noted above.  Physical Exam Vitals: Blood pressure 114/56, pulse 73, height 5\' 2"  (1.575 m), weight 128 lb 12.8 oz (58.423 kg), SpO2 97.00%.  General: Well developed, well appearing 79 y.o. female in no acute distress. HEENT: Normocephalic, atraumatic. EOMs intact. Sclera nonicteric. Oropharynx clear.  Neck: Supple. No JVD. Lungs: Respirations regular and unlabored, CTA bilaterally. No wheezes, rales or rhonchi. Heart: RRR. S1, S2 present. No murmurs, rub, S3 or S4. Abdomen: Soft, non-distended.  Extremities: No clubbing, cyanosis or edema. PT/Radials 2+ and equal bilaterally. Psych: Normal affect. Neuro: Alert and oriented X 3. Moves all extremities spontaneously.   Diagnostics Echocardiogram Feb 2014 Study Conclusions - Left ventricle: The cavity size was normal. There was mild concentric hypertrophy. Systolic function was mildly reduced. The estimated ejection fraction was in the range of 45% to 50%. Severe hypokinesis and thinning of the mid-distal anteroseptal, basilar inferior and basilar inferolateral myocardium; consistent with infarction. - Aortic valve: Mildly to moderately calcified annulus. Trileaflet; mildly thickened, mildly calcified leaflets. Cusp separation was mildly reduced. - Mitral valve: Calcified annulus. Moderately thickened, moderately calcified leaflets . Mild to moderate regurgitation. - Left atrium: The atrium was mildly dilated. - Atrial septum: No defect or patent foramen ovale was identified.  Cardiac catheterization March 2013 Procedural  Findings:  Hemodynamics:  AO 145/86  LV 150/23  Coronary angiography:  Coronary dominance: Right  Left mainstem: Heavy calcification with distal 40% stenosis  Left anterior descending (LAD): Heavy proximal calcification with ostial  70% stenosis followed by long 50% stenosis followed by occlusion after a moderate size first diagonal. The diagonal has ostial 95% stenosis with proximal and mid diffuse 60% stenosis.  Left circumflex (LCx): The circumflex appears to be a large vessel. It is occluded in the AV groove after the first obtuse marginal. The first obtuse marginal is a small branching vessel. There is ostial 90% stenosis. The superior branch is patent but small. The inferior branch appears to be large but is occluded. There is an extensive network of occluded marginals and posterior laterals after this that fill to some degree by collaterals.  Ramus intermedius (RI): This is a large vessel. There is moderate proximal calcification. There is a long 60% stenosis in the proximal and mid segment. The remainder of the vessel is free of high-grade disease.  Right coronary artery (RCA): Long proximal 40-50% stenosis followed by occlusion after a moderate-sized acute marginal branch. The acute marginal has long 80% ostial stenosis.  Left ventriculography: Left ventricular systolic function is normal, LVEF is estimated at 55-65%, there is moderate mitral regurgitation  Final Conclusions: Severe diffuse three-vessel coronary artery disease. Well preserved ejection fraction.  Recommendations: The patient's coronary disease is long-standing severe diffuse and extensively collateralized. At this point there are no high grade lesions amenable to percutaneous revascularization. Any attempts at surgical revascularization would be very incomplete. Given this medical management and continued attempts at risk reduction is the preferred therapy. Given her extensive disease she would be at moderately high risk to hight  risk for cardiovascular events with elective bowel surgery.   Assessment and Plan 1. CAD s/p recent NSTEMI - severe, diffuse and extensively collateralized by cath March 2013; medical management recommended (see above cath note) - no recurrent chest pain - continue medical therapy  - no changes made today - return for follow-up with Dr. Antoine Poche in 4-6 weeks  Signed, Rick Duff, PA-C 09/06/2012, 11:01 AM

## 2012-09-14 ENCOUNTER — Other Ambulatory Visit: Payer: Self-pay | Admitting: Geriatric Medicine

## 2012-09-28 ENCOUNTER — Other Ambulatory Visit: Payer: Self-pay

## 2012-09-28 MED ORDER — PANTOPRAZOLE SODIUM 40 MG PO TBEC: 40.0000 mg | DELAYED_RELEASE_TABLET | Freq: Every day | ORAL | Status: AC

## 2012-09-28 MED ORDER — LEVOTHYROXINE SODIUM 25 MCG PO TABS: 25.0000 ug | ORAL_TABLET | Freq: Every day | ORAL | Status: AC

## 2012-09-28 MED ORDER — ACETAMINOPHEN-CODEINE #3 300-30 MG PO TABS
1.0000 | ORAL_TABLET | ORAL | Status: DC | PRN
Start: 2012-09-28 — End: 2012-10-17

## 2012-09-28 MED ORDER — NITROGLYCERIN 0.6 MG/HR TD PT24: 1.0000 | MEDICATED_PATCH | TRANSDERMAL | Status: DC

## 2012-09-28 MED ORDER — NITROGLYCERIN 0.4 MG SL SUBL: 0.4000 mg | SUBLINGUAL_TABLET | SUBLINGUAL | Status: DC | PRN

## 2012-09-28 MED ORDER — SACCHAROMYCES BOULARDII 250 MG PO CAPS: 250.0000 mg | ORAL_CAPSULE | Freq: Two times a day (BID) | ORAL | Status: AC

## 2012-09-28 NOTE — Telephone Encounter (Signed)
Patient is currently in [employer], [place] and needs rx sent to [facility] in [place]. Patient would like rx's sent in for 30 day supplies. (see pended rx's) [phone] (phone number to pharmacy)   Dr.Pandey please advise on request for controlled substance for out of town

## 2012-09-29 NOTE — Telephone Encounter (Signed)
All RX's were called into number provided by patient, patient is out of town in [place], as stated in note. # 30 with no refills on all medications, patient only needed a temp supply

## 2012-10-05 ENCOUNTER — Ambulatory Visit: Payer: Medicare Other | Admitting: Internal Medicine

## 2012-10-17 ENCOUNTER — Telehealth: Payer: Self-pay | Admitting: Cardiology

## 2012-10-17 ENCOUNTER — Other Ambulatory Visit: Payer: Self-pay | Admitting: Internal Medicine

## 2012-10-17 DIAGNOSIS — I2510 Atherosclerotic heart disease of native coronary artery without angina pectoris: Secondary | ICD-10-CM

## 2012-10-17 MED ORDER — NITROGLYCERIN 0.4 MG SL SUBL: 0.4000 mg | SUBLINGUAL_TABLET | SUBLINGUAL | Status: DC | PRN

## 2012-10-17 NOTE — Telephone Encounter (Signed)
I spoke with the patient. She states that from 8/10-9/4 she had used a bottle of NTG. She complains of intermittent chest heaviness almost daily depending on her activity. She is currently living in [place] with her son. She is not certain if this will be long term or where she will end up. She is from the [place] area and wants to get back there. She has not been hospitalized since August 2014 at [facility]. I advised I will refill NTG at [facility] in [place]- phone # [phone]. She is also wanting to know if she needs to continue Zetia as this cost her $ 196 after her insurance pays. If this needs to be continued, she would like RX for all her cardiac meds mailed to her for #90 w/ RF's.   Address: c/o [facility]qu[facility] [address]                 [address]                 [place], [place] [address]  I have informed her that she needs to establish with a local cardiologist given the amount of NTG that she is having to use. Will send in RX for NTG and forward to Pam and Dr. Antoine Poche to review Zetia and mail RXs.

## 2012-10-17 NOTE — Telephone Encounter (Signed)
Pt states she needs a refill for nitro but it isnt time for it to be refilled. She also wants to know if she needs to continue with her zetia. Pt states medicine is so expensive. She wanted to know what her other med options are.

## 2012-10-18 NOTE — Telephone Encounter (Signed)
Pt aware and will try to establish with MD there.  She states she and her family are trying to find her someone to see.   If she has more chest pain she will report to the closest ED.

## 2012-10-18 NOTE — Telephone Encounter (Signed)
She needs to establish with a local cardiologist ASAP.

## 2012-10-28 ENCOUNTER — Ambulatory Visit: Payer: Self-pay | Admitting: Cardiology

## 2012-10-28 ENCOUNTER — Other Ambulatory Visit: Payer: Self-pay | Admitting: *Deleted

## 2012-10-28 DIAGNOSIS — I2510 Atherosclerotic heart disease of native coronary artery without angina pectoris: Secondary | ICD-10-CM

## 2012-10-28 MED ORDER — FUROSEMIDE 20 MG PO TABS: 20.0000 mg | ORAL_TABLET | Freq: Every day | ORAL | Status: AC

## 2012-10-28 MED ORDER — NITROGLYCERIN 0.4 MG SL SUBL: 0.4000 mg | SUBLINGUAL_TABLET | SUBLINGUAL | Status: AC | PRN

## 2012-10-28 MED ORDER — METOPROLOL TARTRATE 50 MG PO TABS: 25.0000 mg | ORAL_TABLET | Freq: Two times a day (BID) | ORAL | Status: AC

## 2012-10-28 MED ORDER — NITROGLYCERIN 0.6 MG/HR TD PT24: 1.0000 | MEDICATED_PATCH | TRANSDERMAL | Status: AC

## 2012-10-28 MED ORDER — NITROGLYCERIN 0.6 MG/HR TD PT24: 1.0000 | MEDICATED_PATCH | TRANSDERMAL | Status: DC

## 2012-11-11 ENCOUNTER — Telehealth: Payer: Self-pay | Admitting: Gastroenterology

## 2012-11-11 NOTE — Telephone Encounter (Signed)
Is this ok Dr Christella Hartigan?

## 2012-11-11 NOTE — Telephone Encounter (Signed)
I spoke with Bonita Quin and she states that she will fax a form to be signed.

## 2012-11-11 NOTE — Telephone Encounter (Signed)
Yes, that is ok with me 

## 2012-11-14 ENCOUNTER — Telehealth: Payer: Self-pay | Admitting: Gastroenterology

## 2012-11-14 NOTE — Telephone Encounter (Signed)
Pt has been notified that we can send any records to the GI they prefer to transfer to.  Pt agreed and was transferred to medical records to have the records sent to the new GI

## 2013-02-14 ENCOUNTER — Other Ambulatory Visit: Payer: Self-pay | Admitting: Internal Medicine

## 2013-02-15 NOTE — Telephone Encounter (Signed)
Last RF: 10/17/12 # 30 Is it okay to refill? Please advise. thanks

## 2013-02-16 NOTE — Telephone Encounter (Signed)
No needs appt with PCP [facility]

## 2013-02-16 NOTE — Telephone Encounter (Signed)
Declined refill sent to pharmacy.

## 2013-02-16 NOTE — Telephone Encounter (Signed)
Last refill was 10/17/12 Is it okay to refill? Please advise. thanks

## 2013-03-10 ENCOUNTER — Other Ambulatory Visit: Payer: Self-pay | Admitting: Vascular Surgery

## 2013-03-10 DIAGNOSIS — Z48812 Encounter for surgical aftercare following surgery on the circulatory system: Secondary | ICD-10-CM

## 2013-03-10 DIAGNOSIS — I6529 Occlusion and stenosis of unspecified carotid artery: Secondary | ICD-10-CM

## 2013-05-24 ENCOUNTER — Encounter: Payer: Self-pay | Admitting: Family

## 2013-05-25 ENCOUNTER — Other Ambulatory Visit (HOSPITAL_COMMUNITY): Payer: Medicare Other

## 2013-05-25 ENCOUNTER — Ambulatory Visit: Payer: Medicare Other | Admitting: Family

## 2013-05-25 ENCOUNTER — Ambulatory Visit: Payer: Medicare Other | Admitting: Vascular Surgery

## 2014-01-04 ENCOUNTER — Encounter (HOSPITAL_COMMUNITY): Payer: Self-pay | Admitting: Vascular Surgery

## 2017-09-08 ENCOUNTER — Encounter: Payer: Self-pay | Admitting: Internal Medicine

## 2018-04-27 DEATH — deceased
